# Patient Record
Sex: Male | Born: 1975 | Race: White | Hispanic: No | Marital: Married | Smoking: Former smoker
Health system: Southern US, Community
[De-identification: ages and names within clinical notes are randomized; demographics above are authoritative.]

## PROBLEM LIST (undated history)

## (undated) DIAGNOSIS — I209 Angina pectoris, unspecified: Secondary | ICD-10-CM

## (undated) DIAGNOSIS — F32A Depression, unspecified: Secondary | ICD-10-CM

## (undated) DIAGNOSIS — K219 Gastro-esophageal reflux disease without esophagitis: Secondary | ICD-10-CM

## (undated) DIAGNOSIS — M1990 Unspecified osteoarthritis, unspecified site: Secondary | ICD-10-CM

## (undated) DIAGNOSIS — J189 Pneumonia, unspecified organism: Secondary | ICD-10-CM

## (undated) DIAGNOSIS — R0600 Dyspnea, unspecified: Secondary | ICD-10-CM

## (undated) DIAGNOSIS — G4730 Sleep apnea, unspecified: Secondary | ICD-10-CM

## (undated) DIAGNOSIS — I10 Essential (primary) hypertension: Secondary | ICD-10-CM

## (undated) DIAGNOSIS — F419 Anxiety disorder, unspecified: Secondary | ICD-10-CM

## (undated) DIAGNOSIS — E119 Type 2 diabetes mellitus without complications: Secondary | ICD-10-CM

## (undated) DIAGNOSIS — R51 Headache: Secondary | ICD-10-CM

## (undated) DIAGNOSIS — F329 Major depressive disorder, single episode, unspecified: Secondary | ICD-10-CM

## (undated) HISTORY — PX: BACK SURGERY: SHX140

## (undated) HISTORY — PX: LUMBAR DISC SURGERY: SHX700

## (undated) HISTORY — PX: WISDOM TOOTH EXTRACTION: SHX21

## (undated) HISTORY — PX: OTHER SURGICAL HISTORY: SHX169

---

## 2004-10-04 ENCOUNTER — Encounter: Payer: Self-pay | Admitting: Neurosurgery

## 2004-10-18 ENCOUNTER — Encounter: Payer: Self-pay | Admitting: Neurosurgery

## 2004-11-18 ENCOUNTER — Encounter: Payer: Self-pay | Admitting: Neurosurgery

## 2005-01-04 ENCOUNTER — Ambulatory Visit (HOSPITAL_COMMUNITY): Admission: RE | Admit: 2005-01-04 | Discharge: 2005-01-05 | Payer: Self-pay | Admitting: Neurosurgery

## 2005-09-16 ENCOUNTER — Ambulatory Visit: Payer: Self-pay

## 2005-09-26 ENCOUNTER — Ambulatory Visit: Payer: Self-pay

## 2006-01-23 ENCOUNTER — Other Ambulatory Visit: Payer: Self-pay

## 2006-01-23 ENCOUNTER — Emergency Department: Payer: Self-pay | Admitting: Emergency Medicine

## 2006-07-03 ENCOUNTER — Encounter
Admission: RE | Admit: 2006-07-03 | Discharge: 2006-10-01 | Payer: Self-pay | Admitting: Physical Medicine & Rehabilitation

## 2006-07-04 ENCOUNTER — Ambulatory Visit: Payer: Self-pay | Admitting: Physical Medicine & Rehabilitation

## 2006-07-19 ENCOUNTER — Encounter: Payer: Self-pay | Admitting: Neurosurgery

## 2006-07-21 ENCOUNTER — Encounter: Payer: Self-pay | Admitting: Neurosurgery

## 2006-08-29 ENCOUNTER — Ambulatory Visit: Payer: Self-pay | Admitting: Physical Medicine & Rehabilitation

## 2006-11-09 ENCOUNTER — Ambulatory Visit: Payer: Self-pay | Admitting: Physical Medicine & Rehabilitation

## 2006-11-09 ENCOUNTER — Encounter
Admission: RE | Admit: 2006-11-09 | Discharge: 2007-02-07 | Payer: Self-pay | Admitting: Physical Medicine & Rehabilitation

## 2007-06-07 ENCOUNTER — Ambulatory Visit: Payer: Self-pay | Admitting: Specialist

## 2008-05-21 ENCOUNTER — Ambulatory Visit: Payer: Self-pay | Admitting: Specialist

## 2008-05-28 ENCOUNTER — Ambulatory Visit: Payer: Self-pay | Admitting: Specialist

## 2010-11-02 NOTE — Assessment & Plan Note (Signed)
PATIENT:  [name] [name]   DATE OF BIRTH:  01-13-1976.   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 He has evidence of epidural fibrosis on post  laminectomy MRI with gadolinium.  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 At that time he was having  exacerbation of pain but it appeared to be more facet mediated axial  pain, no real radicular symptoms; this has improved to a certain extent  and in fact has reduced his Skelaxin to once a day and his Celebrex to  once a day.  He can walk about.  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  Pain score is in 2-3 range.  His sleep is fair.  Relief from medications  is good.   VITAL SIGNS:  His blood pressure is 137/82, pulse 100, respirations 16,  O2 saturation 96% in room air.  GENERAL:  In no acute distress. Moderately obese male.   His gait is normal, he is able to toe-walk and heel-walk.  He has no  tenderness to palpation of his back.  He has end range pain with flexion  and extension of his lumbar spine.  He has normal deep tendon reflexes  and normal sensation, normal strength in lower extremities.   IMPRESSION:  Lumbar degenerative disc.  Had an episode of what appeared  to be facet arthropathy but this has subsided.  No increase in radicular  symptoms.   PLAN:  Will stop the Skelaxin, continue the Celebrex 200 mg once a day.  I will see him back in 2 months.  If he has worsening of pain off the  Skelaxin he can call in with me to call in Skelaxin or give a trial of  cyclobenzaprine given at his request is for a generic agent.  I asked  him to stop the Celebrex in a few weeks if all is going well, or use it  p.r.n.   I will see him back in 2 months.      Erick Colace, M.D.  Electronically Signed     AEK/MedQ  D:  12/05/2006 14:30:14  T:  12/05/2006 20:41:24  Job #:  [id number]   cc:   Hilda Lias, M.D.  Fax: [phone]

## 2010-11-02 NOTE — Assessment & Plan Note (Signed)
Date of last visit August 29, 2006.   HISTORY:  A 35 year old male with a history of L5-S1 disc herniation  causing right S1 radiculopathy, status post foraminotomy and  decompression laminectomy in 2006.  He has worked full-time at [employer] in the [employer].  Last visit he was taking 800 mg 1 or 2  tablets per day, as well as cyclobenzaprine 10 nightly.  His pain level  was down to 2 to 4 out of 10.  Over the last month or so, he was  completing physical therapy and has now completed it.  He has over the  last month or so developed increasing pain, mainly his low back greater  than in his leg.  He does not recall any type of specific event.  He was  moving a lot of items in the house, but he states that they were not too  heavy.  He has not continued with his home exercise program because he  has been very busy with his children's sports events.  His pain is worse  during the morning and interferes with general activity at a 4 out of 10  level.  He continues to work full-time.  He can walk 20 minutes  straight.  He can climb steps and drive.  His sleep is fair.  He has  tried ice for his back, but not heat.  Laying down does help his back,  particularly if he lays on his side.  His relief from meds is fair.  We  called in Celebrex and Skelaxin for him and he has been taking this over  the last 2 days and feels that they have been helping him.   REVIEW OF SYSTEMS:  Negative for bowel or bladder dysfunction.   SOCIAL HISTORY:  Married, lives with his wife and 3 sons.   FAMILY HISTORY:  Diabetes, high blood pressure.   EXAMINATION:  Blood pressure 149/84, pulse 88, respiratory rate 16, O2  sat 99% room air.  GENERAL:  No acute distress.  Mood and affect appropriate.  Orientation  x3.  Gait is normal.  His back has no real tenderness to palpation.  He does have pain with  extension greater than with right sided leaning.  His pain is almost  exclusively on the right side of his  back.  He has normal deep tendon  reflexes, normal sensation in the lower extremities as well as normal  strength.  His gait is normal, including heel walk and toe walk.  There  is no obvious scoliosis.   IMPRESSION:  Lumbar degenerative disc.  Appears to be developing more  facet mediated pain.  Does not appear to have any increase in the  radicular symptoms.   PLAN:  Given that he is getting some beneficial effect already from the  Celebrex and Skelaxin we will continue these.  I will see him back in 2  weeks and if at that time, he is no better, or in fact getting worse,  would schedule for lumbar medial branch blocks on the right side.  I did  describe risks and benefits of this procedure including the possibility  of radiofrequency procedures if need be.  I do not think any further  imaging studies are needed at this time.  I will see him back in 2  weeks.      Erick Colace, M.D.  Electronically Signed     AEK/MedQ  D:  11/09/2006 12:57:21  T:  11/09/2006 13:32:28  Job #:  [id number]

## 2010-11-05 NOTE — Op Note (Signed)
NAME:  [name] [name], [name] [name]              ACCOUNT NO.:  [id number]   MEDICAL RECORD NO.:  [id number]          PATIENT TYPE:  OIB   LOCATION:  3014                         FACILITY:  [facility]   PHYSICIAN:  Hilda Lias, M.D.   DATE OF BIRTH:  1975/11/29   DATE OF PROCEDURE:  01/04/2005  DATE OF DISCHARGE:                                 OPERATIVE REPORT   PREOPERATIVE DIAGNOSIS:  Chronic right L5-S1 herniated disk with S1  radiculopathy.   POSTOPERATIVE DIAGNOSIS:  Chronic right L5-S1 herniated disk with S1  radiculopathy.   PROCEDURE:  Right L5-S1 diskectomy, foraminotomy, decompression of the L5-S1  nerve root, microscope.   SURGEON:  Hilda Lias, M.D.   ASSISTANT:  Hewitt Shorts, M.D.   CLINICAL HISTORY:  The patient is being admitted because of back and right  leg pain which have been going on for more than a year.  He had mostly  sensory radiculopathy than pain.  X-rays show a herniated disk at the level  of L5-S1 into the foramen.  The patient wanted to proceed with surgery after  the risks were explained in the history and physical.   PROCEDURE:  The patient was taken to the OR, and he was positioned in a  prone manner.  X-ray was taken, which showed that indeed we were about an  inch below the L5-S1 interspace.  From then on the incision was opened and  the dissection was carried down until we were able to find the L5 spinous  process.  The muscles were retracted to the right side.  Because this  gentleman is over 260 pounds, we brought the microscope immediately and with  the drill, we drilled the lower lamina of L5.  The calcified yellow ligament  was also excised.  We found the S1 nerve root, which was swollen and  reddish.  Retraction was done.  Indeed, there was a herniated disk mostly  going to the foramen.  Incision was made and a large amount of degenerative  disk disease centrally and laterally was removed.  At the end we had good  decompression not only of the  S1 but also of the L5 nerve root.  The area  was irrigated, Valsalva maneuver was negative.  Depo-Medrol was left in the  epidural space as well as fentanyl.  Then the wound was closed with Vicryl  and a Steri-Strip.       EB/MEDQ  D:  01/04/2005  T:  01/04/2005  Job:  [id number]

## 2010-11-05 NOTE — H&P (Signed)
NAME:  [name], [name]              ACCOUNT NO.:  [id number]   MEDICAL RECORD NO.:  [id number]          PATIENT TYPE:  OIB   LOCATION:  3014                         FACILITY:  [facility]   PHYSICIAN:  Hilda Lias, M.D.   DATE OF BIRTH:  05-23-1976   DATE OF ADMISSION:  01/04/2005  DATE OF DISCHARGE:                                HISTORY & PHYSICAL   CHIEF COMPLAINT:  [name] [name] is a gentleman who had been complaining of a  tingling sensation and pain to the right leg for more than a year.  This  pain is getting worse.  He feels that not only the right leg but also the  right hand is getting quite sensitive.  He denies any shooting pain, he  denies any problem with blood clots.  He was seen by [place] initially in August  2005 and since then he has had conservative treatment with no improvement.  Because of the findings he wants to be admitted for surgery.   ALLERGIES:  PENICILLIN.   FAMILY HISTORY:  Positive for high blood pressure and diabetes.   SOCIAL HISTORY:  Negative.  He is 6 feet 1 inch and over 270 pounds.   REVIEW OF SYSTEMS:  Positive for back and right leg pain.   PHYSICAL EXAMINATION:  GENERAL:  The patient is a gentleman who has been to  my office on several occasions.  He always complains of pain going to the  right leg.  HEENT:  Head normocephalic.  Throat normal.  NECK:  Normal.  LUNGS:  Clear.  HEART:  Heart sounds normal.  ABDOMEN:  Normal.  EXTREMITIES:  Normal pulses.  NEUROLOGIC:  Mental status normal.  Cranial nerves normal.  Strength:  Shows  some weakness with plantar flexion of the right foot with decrease of the  right _________.  He had some tenderness in both hands.   DIAGNOSTIC STUDIES:  The MRI as well as the x-ray showed that he has a  herniated disk paracentral to the right side.   CLINICAL IMPRESSION:  Right L5-S1 herniated disk.  The last time I saw Mr.  [name] in my office was two weeks ago when he came in again with his wife  because of the  pain.  He knows about the risks and he is willing to pursue  with surgery.  The procedure will be a right L5-S1 diskectomy and the risks  were infection, CSF leak, worsening pain, paralysis, need for further  surgery which might require fusion.      EB/MEDQ  D:  01/04/2005  T:  01/04/2005  Job:  [id number]

## 2010-11-05 NOTE — Group Therapy Note (Signed)
Consult requested for the evaluation of right-sided S1 radiculopathy,  status post laminectomy, and specifically for pain management.   HISTORY:  Mr. [name] [name] is a pleasant 35 year old male with consult  kindly requested by Dr. Jeral Fruit for a back and right lower extremity  problem that originated in 2005.  He states he slept in a camper during  a child sleepover and woke up with back pain which progressed over time.  He initially presented to his primary MD, who eventually ordered an MRI,  demonstrating a disk bulge.  He does not recall the exact level.  He was  evaluated by Dr. Jeral Fruit  on February 02, 2004 and found to have normal  plain x-rays.  He also complained of upper extremity numbness and  tingling.  He was sent for an EMG, which is performed at [facility], and demonstrated mild carpal tunnel on the right greater  than the left.  His MRI was reviewed by Dr. Jeral Fruit, and conservative  care was initially tried.  Had some good results with physical therapy  at [facility].  TENS was particular helpful for  him and he did well for a couple of months, but then presented again to  Dr. Jeral Fruit in March of 2006 with increasing pain down the right leg.  MRI demonstrating the L5-S1 disk impinging upon the L5 nerve root.  This  is extrapyramidal.  He had a decompression laminectomy per Dr. Jeral Fruit, a  foraminotomy, L5-S1 discectomy on the right.  He no postoperative  complications.  He has had further followup, including a repeat MRI on  May 16, 2006 demonstrating no recurrent disk, however, he has an  epidural fibrosis around the S1 nerve root on the right.  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   REVIEW OF SYSTEMS:  Positive for numbness and tingling in the right  lower extremity.  He otherwise is quite healthy.  His only  medications  are ibuprofen; he takes 200 mg 4 times a day.  He has poor sleep and he  states that his lower extremity pain interferes with sleep.   ALLERGIES:  SHELLFISH and PENICILLIN which appears to be anaphylactic.   SOCIAL HISTORY:  He is married and lives with his wife and three  children.   FAMILY HISTORY:  Positive for diabetes and high blood pressure.   EXAMINATION:  A well-built male in no acute distress.  Mood and affect  appropriate.  His blood pressure is 152/94, pulse 80, respirations 16,  O2 saturation is 100%.  He is orientated times 3.  His gait is normal.  He has good spine forward flexion.  Extension is limited to 50%,  accompanied by some pain in the back.  Deep tendon reflexes are normal.  Range of motion is normal, bilateral upper and lower extremities.  Sensation is normal, bilateral upper and lower extremities, to pinprick,  proprioception, and light touch.  Neck range of motion is good.  He can  both toe walk and heel walk.   IMPRESSION:  1. Lumbar post laminectomy syndrome.  2. 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   RECOMMENDATIONS:  1. Given that he is responding quite well to ibuprofen at a non-      prescription dose, I think he can benefit from trying this twice a  day, once in the morning and once at night.  In addition, we will      start him on cyclobenzaprine 7.5 mg nightly.  Gave him some      samples.  2. Given his favorable response to TENS, as well as physical therapy      in the past, I think it would be helpful to repeat some therapy at      [facility] and include a TENS evaluation and      to obtain a home unit if it proves to be beneficial once again.  I      will see him back in one month.  If he is not doing much better, we      will consider other treatment modalities, including epidural      steroid injection plus/minus facet injections.   Other treatment options such acupuncture have been  discussed as well.   Thank you very much for this interesting consultation.      Erick Colace, M.D.  Electronically Signed     AEK/MedQ  D:  07/04/2006 17:01:59  T:  07/05/2006 05:56:18  Job #:  [id number]   cc:   Hilda Lias, M.D.  Fax: [phone]

## 2011-08-03 ENCOUNTER — Other Ambulatory Visit: Payer: Self-pay | Admitting: Neurosurgery

## 2011-08-03 DIAGNOSIS — M5430 Sciatica, unspecified side: Secondary | ICD-10-CM

## 2011-08-10 ENCOUNTER — Ambulatory Visit
Admission: RE | Admit: 2011-08-10 | Discharge: 2011-08-10 | Disposition: A | Discharge status: Home / Self Care | Payer: Worker's Compensation | Source: Ambulatory Visit | Attending: Neurosurgery | Admitting: Neurosurgery

## 2011-08-10 DIAGNOSIS — M5430 Sciatica, unspecified side: Secondary | ICD-10-CM

## 2011-08-10 MED ORDER — DIAZEPAM 5 MG PO TABS
10.0000 mg | ORAL_TABLET | Freq: Once | ORAL | Status: AC
  Administered 2011-08-10: 10 mg via ORAL

## 2011-08-10 NOTE — Progress Notes (Signed)
Pt has been off lexapro for 2 days.

## 2011-08-10 NOTE — Progress Notes (Signed)
Resting quietly in nursing station with complaint of mild headache.  Does not care for any pain medications at present time.  Wife at bedside.  jkl

## 2011-08-10 NOTE — Discharge Instructions (Signed)
Myelogram  Discharge Instructions  1. Go home and rest quietly for the next 24 hours.  It is important to lie flat for the next 24 hours.  Get up only to go to the restroom.  You may lie in the bed or on a couch on your back, your stomach, your left side or your right side.  You may have one pillow under your head.  You may have pillows between your knees while you are on your side or under your knees while you are on your back.  2. DO NOT drive today.  Recline the seat as far back as it will go, while still wearing your seat belt, on the way home.  3. You may get up to go to the bathroom as needed.  You may sit up for 10 minutes to eat.  You may resume your normal diet and medications unless otherwise indicated.  Drink lots of extra fluids today and tomorrow.  4. The incidence of headache, nausea, or vomiting is about 5% (one in 20 patients).  If you develop a headache, lie flat and drink plenty of fluids until the headache goes away.  Caffeinated beverages may be helpful.  If you develop severe nausea and vomiting or a headache that does not go away with flat bed rest, call [phone].  5. You may resume normal activities after your 24 hours of bed rest is over; however, do not exert yourself strongly or do any heavy lifting tomorrow.  6. Call your physician for a follow-up appointment.  The results of your myelogram will be sent directly to your physician by the following day.  7. If you have any questions or if complications develop after you arrive home, please call [phone].  Discharge instructions have been explained to the patient.  The patient, or the person responsible for the patient, fully understands these instructions.   May resume lexapro on August 11, 2011, after 9:30 am.

## 2011-11-08 HISTORY — PX: LUMBAR SPINE SURGERY: SHX701

## 2011-11-23 ENCOUNTER — Inpatient Hospital Stay (HOSPITAL_COMMUNITY): Payer: Worker's Compensation | Admitting: Anesthesiology

## 2011-11-23 ENCOUNTER — Encounter (HOSPITAL_COMMUNITY)
Admission: AD | Disposition: A | Discharge status: Home / Self Care | Payer: Self-pay | Source: Ambulatory Visit | Attending: Neurosurgery

## 2011-11-23 ENCOUNTER — Inpatient Hospital Stay (HOSPITAL_COMMUNITY)
Admission: AD | Admit: 2011-11-23 | Discharge: 2011-11-26 | DRG: 863 | Disposition: A | Discharge status: Home / Self Care | Payer: Worker's Compensation | Source: Ambulatory Visit | Attending: Neurosurgery | Admitting: Neurosurgery

## 2011-11-23 ENCOUNTER — Encounter (HOSPITAL_COMMUNITY): Payer: Self-pay | Admitting: Anesthesiology

## 2011-11-23 ENCOUNTER — Other Ambulatory Visit: Payer: Self-pay | Admitting: Neurosurgery

## 2011-11-23 ENCOUNTER — Encounter (HOSPITAL_COMMUNITY): Payer: Self-pay | Admitting: General Practice

## 2011-11-23 DIAGNOSIS — F329 Major depressive disorder, single episode, unspecified: Secondary | ICD-10-CM | POA: Diagnosis present

## 2011-11-23 DIAGNOSIS — G4730 Sleep apnea, unspecified: Secondary | ICD-10-CM | POA: Diagnosis present

## 2011-11-23 DIAGNOSIS — Z87891 Personal history of nicotine dependence: Secondary | ICD-10-CM

## 2011-11-23 DIAGNOSIS — F3289 Other specified depressive episodes: Secondary | ICD-10-CM | POA: Diagnosis present

## 2011-11-23 DIAGNOSIS — T8140XA Infection following a procedure, unspecified, initial encounter: Principal | ICD-10-CM | POA: Diagnosis present

## 2011-11-23 DIAGNOSIS — B951 Streptococcus, group B, as the cause of diseases classified elsewhere: Secondary | ICD-10-CM | POA: Diagnosis present

## 2011-11-23 DIAGNOSIS — K219 Gastro-esophageal reflux disease without esophagitis: Secondary | ICD-10-CM | POA: Diagnosis present

## 2011-11-23 DIAGNOSIS — I10 Essential (primary) hypertension: Secondary | ICD-10-CM | POA: Diagnosis present

## 2011-11-23 DIAGNOSIS — T8149XA Infection following a procedure, other surgical site, initial encounter: Secondary | ICD-10-CM

## 2011-11-23 DIAGNOSIS — Z880 Allergy status to penicillin: Secondary | ICD-10-CM

## 2011-11-23 DIAGNOSIS — Y834 Other reconstructive surgery as the cause of abnormal reaction of the patient, or of later complication, without mention of misadventure at the time of the procedure: Secondary | ICD-10-CM | POA: Diagnosis present

## 2011-11-23 DIAGNOSIS — Y92009 Unspecified place in unspecified non-institutional (private) residence as the place of occurrence of the external cause: Secondary | ICD-10-CM

## 2011-11-23 HISTORY — DX: Essential (primary) hypertension: I10

## 2011-11-23 HISTORY — DX: Sleep apnea, unspecified: G47.30

## 2011-11-23 HISTORY — DX: Gastro-esophageal reflux disease without esophagitis: K21.9

## 2011-11-23 HISTORY — DX: Major depressive disorder, single episode, unspecified: F32.9

## 2011-11-23 HISTORY — DX: Headache: R51

## 2011-11-23 HISTORY — PX: LUMBAR WOUND DEBRIDEMENT: SHX1988

## 2011-11-23 HISTORY — DX: Depression, unspecified: F32.A

## 2011-11-23 LAB — BASIC METABOLIC PANEL
CO2: 28 mEq/L (ref 19–32)
Calcium: 9.3 mg/dL (ref 8.4–10.5)
Creatinine, Ser: 0.99 mg/dL (ref 0.50–1.35)
GFR calc non Af Amer: >90 mL/min (ref >90)
Sodium: 133 mEq/L — ABNORMAL LOW (ref 135–145)

## 2011-11-23 LAB — CBC
HCT: 41.7 % (ref 39.0–52.0)
MCH: 31.8 pg (ref 26.0–34.0)
RBC: 4.62 MIL/uL (ref 4.22–5.81)
WBC: 26.6 10*3/uL — ABNORMAL HIGH (ref 4.0–10.5)

## 2011-11-23 LAB — PROTIME-INR: Prothrombin Time: 14.1 seconds (ref 11.6–15.2)

## 2011-11-23 SURGERY — LUMBAR WOUND DEBRIDEMENT
Anesthesia: General | Site: Back | Wound class: Dirty or Infected

## 2011-11-23 MED ORDER — HYDROMORPHONE HCL PF 1 MG/ML IJ SOLN
0.2500 mg | INTRAMUSCULAR | Status: DC | PRN
  Administered 2011-11-23: 0.5 mg via INTRAVENOUS

## 2011-11-23 MED ORDER — ESCITALOPRAM OXALATE 20 MG PO TABS
20.0000 mg | ORAL_TABLET | Freq: Every day | ORAL | Status: DC
  Administered 2011-11-23: 20 mg via ORAL
  Filled 2011-11-23 (×2): qty 1

## 2011-11-23 MED ORDER — PHENOL 1.4 % MT LIQD: 1.0000 | OROMUCOSAL | Status: DC | PRN

## 2011-11-23 MED ORDER — NEOSTIGMINE METHYLSULFATE 1 MG/ML IJ SOLN
INTRAMUSCULAR | Status: DC | PRN
  Administered 2011-11-23: 4 mg via INTRAVENOUS

## 2011-11-23 MED ORDER — DIAZEPAM 5 MG PO TABS
5.0000 mg | ORAL_TABLET | Freq: Four times a day (QID) | ORAL | Status: DC | PRN
  Administered 2011-11-24 – 2011-11-25 (×3): 5 mg via ORAL
  Filled 2011-11-23 (×4): qty 1

## 2011-11-23 MED ORDER — LIDOCAINE HCL (CARDIAC) 20 MG/ML IV SOLN
INTRAVENOUS | Status: DC | PRN
  Administered 2011-11-23: 40 mg via INTRAVENOUS

## 2011-11-23 MED ORDER — LISINOPRIL 40 MG PO TABS
40.0000 mg | ORAL_TABLET | Freq: Every day | ORAL | Status: DC
  Administered 2011-11-25: 40 mg via ORAL
  Filled 2011-11-23 (×3): qty 1

## 2011-11-23 MED ORDER — SODIUM CHLORIDE 0.9 % IV SOLN: INTRAVENOUS | Status: DC

## 2011-11-23 MED ORDER — MENTHOL 3 MG MT LOZG: 1.0000 | LOZENGE | OROMUCOSAL | Status: DC | PRN

## 2011-11-23 MED ORDER — MEPERIDINE HCL 25 MG/ML IJ SOLN: 6.2500 mg | INTRAMUSCULAR | Status: DC | PRN

## 2011-11-23 MED ORDER — BIMATOPROST 0.01 % OP SOLN
1.0000 [drp] | Freq: Every day | OPHTHALMIC | Status: DC
  Administered 2011-11-23 – 2011-11-25 (×3): 1 [drp] via OPHTHALMIC
  Filled 2011-11-23: qty 2.5

## 2011-11-23 MED ORDER — HYDROMORPHONE HCL PF 1 MG/ML IJ SOLN
INTRAMUSCULAR | Status: AC
  Administered 2011-11-23: 0.5 mg via INTRAVENOUS
  Filled 2011-11-23: qty 1

## 2011-11-23 MED ORDER — FENTANYL CITRATE 0.05 MG/ML IJ SOLN
INTRAMUSCULAR | Status: DC | PRN
  Administered 2011-11-23: 150 ug via INTRAVENOUS
  Administered 2011-11-23: 100 ug via INTRAVENOUS

## 2011-11-23 MED ORDER — ONDANSETRON HCL 4 MG/2ML IJ SOLN
4.0000 mg | INTRAMUSCULAR | Status: DC | PRN
  Administered 2011-11-24: 4 mg via INTRAVENOUS
  Filled 2011-11-23: qty 2

## 2011-11-23 MED ORDER — GLYCOPYRROLATE 0.2 MG/ML IJ SOLN
INTRAMUSCULAR | Status: DC | PRN
  Administered 2011-11-23: .5 mg via INTRAVENOUS

## 2011-11-23 MED ORDER — LACTATED RINGERS IV SOLN
INTRAVENOUS | Status: DC | PRN
  Administered 2011-11-23 (×2): via INTRAVENOUS

## 2011-11-23 MED ORDER — SODIUM CHLORIDE 0.9 % IJ SOLN: 3.0000 mL | INTRAMUSCULAR | Status: DC | PRN

## 2011-11-23 MED ORDER — SODIUM CHLORIDE 0.9 % IV SOLN
INTRAVENOUS | Status: DC
  Administered 2011-11-23: 14:00:00 via INTRAVENOUS

## 2011-11-23 MED ORDER — MIDAZOLAM HCL 5 MG/5ML IJ SOLN
INTRAMUSCULAR | Status: DC | PRN
  Administered 2011-11-23: 2 mg via INTRAVENOUS

## 2011-11-23 MED ORDER — LISINOPRIL 40 MG PO TABS: 40.0000 mg | ORAL_TABLET | Freq: Every day | ORAL | Status: DC

## 2011-11-23 MED ORDER — OXYCODONE-ACETAMINOPHEN 5-325 MG PO TABS
1.0000 | ORAL_TABLET | ORAL | Status: DC | PRN
  Administered 2011-11-23 – 2011-11-26 (×9): 2 via ORAL
  Filled 2011-11-23 (×9): qty 2

## 2011-11-23 MED ORDER — SODIUM CHLORIDE 0.9 % IV SOLN: 250.0000 mL | INTRAVENOUS | Status: DC

## 2011-11-23 MED ORDER — MORPHINE SULFATE 4 MG/ML IJ SOLN
4.0000 mg | INTRAMUSCULAR | Status: DC | PRN
  Administered 2011-11-23: 4 mg via INTRAVENOUS
  Filled 2011-11-23: qty 1

## 2011-11-23 MED ORDER — PROPOFOL 10 MG/ML IV EMUL
INTRAVENOUS | Status: DC | PRN
  Administered 2011-11-23: 200 mg via INTRAVENOUS

## 2011-11-23 MED ORDER — ROCURONIUM BROMIDE 100 MG/10ML IV SOLN
INTRAVENOUS | Status: DC | PRN
  Administered 2011-11-23: 30 mg via INTRAVENOUS

## 2011-11-23 MED ORDER — PROMETHAZINE HCL 25 MG/ML IJ SOLN: 6.2500 mg | INTRAMUSCULAR | Status: DC | PRN

## 2011-11-23 MED ORDER — SUCCINYLCHOLINE CHLORIDE 20 MG/ML IJ SOLN
INTRAMUSCULAR | Status: DC | PRN
  Administered 2011-11-23: 140 mg via INTRAVENOUS

## 2011-11-23 MED ORDER — MIDAZOLAM HCL 2 MG/2ML IJ SOLN: 0.5000 mg | Freq: Once | INTRAMUSCULAR | Status: DC | PRN

## 2011-11-23 MED ORDER — VANCOMYCIN HCL 1000 MG IV SOLR
1250.0000 mg | Freq: Two times a day (BID) | INTRAVENOUS | Status: DC
  Administered 2011-11-24 – 2011-11-25 (×4): 1250 mg via INTRAVENOUS
  Filled 2011-11-23 (×5): qty 1250

## 2011-11-23 MED ORDER — SODIUM CHLORIDE 0.9 % IJ SOLN
3.0000 mL | Freq: Two times a day (BID) | INTRAMUSCULAR | Status: DC
  Administered 2011-11-23 – 2011-11-25 (×4): 3 mL via INTRAVENOUS

## 2011-11-23 MED ORDER — ACETAMINOPHEN 325 MG PO TABS: 650.0000 mg | ORAL_TABLET | ORAL | Status: DC | PRN

## 2011-11-23 MED ORDER — VANCOMYCIN HCL IN DEXTROSE 1-5 GM/200ML-% IV SOLN
INTRAVENOUS | Status: AC
  Administered 2011-11-23: 1000 mg via INTRAVENOUS
  Filled 2011-11-23: qty 200

## 2011-11-23 MED ORDER — MORPHINE SULFATE 4 MG/ML IJ SOLN: 4.0000 mg | INTRAMUSCULAR | Status: DC | PRN

## 2011-11-23 MED ORDER — ONDANSETRON HCL 4 MG/2ML IJ SOLN
INTRAMUSCULAR | Status: DC | PRN
  Administered 2011-11-23: 4 mg via INTRAVENOUS

## 2011-11-23 MED ORDER — ACETAMINOPHEN 650 MG RE SUPP: 650.0000 mg | RECTAL | Status: DC | PRN

## 2011-11-23 MED ORDER — 0.9 % SODIUM CHLORIDE (POUR BTL) OPTIME
TOPICAL | Status: DC | PRN
  Administered 2011-11-23 (×2): 1000 mL

## 2011-11-23 SURGICAL SUPPLY — 59 items
BENZOIN TINCTURE PRP APPL 2/3 (GAUZE/BANDAGES/DRESSINGS) IMPLANT
BLADE SURG ROTATE 9660 (MISCELLANEOUS) IMPLANT
CANISTER SUCTION 2500CC (MISCELLANEOUS) ×2 IMPLANT
CLOTH BEACON ORANGE TIMEOUT ST (SAFETY) ×2 IMPLANT
DRAPE LAPAROTOMY 100X72X124 (DRAPES) ×2 IMPLANT
DRAPE MICROSCOPE LEICA (MISCELLANEOUS) ×2 IMPLANT
DRAPE POUCH INSTRU U-SHP 10X18 (DRAPES) ×2 IMPLANT
DRSG PAD ABDOMINAL 8X10 ST (GAUZE/BANDAGES/DRESSINGS) ×4 IMPLANT
DURAPREP 26ML APPLICATOR (WOUND CARE) ×2 IMPLANT
ELECT REM PT RETURN 9FT ADLT (ELECTROSURGICAL) ×2
ELECTRODE REM PT RTRN 9FT ADLT (ELECTROSURGICAL) ×1 IMPLANT
GAUZE SPONGE 4X4 16PLY XRAY LF (GAUZE/BANDAGES/DRESSINGS) IMPLANT
GLOVE BIO SURGEON STRL SZ 6.5 (GLOVE) IMPLANT
GLOVE BIO SURGEON STRL SZ7 (GLOVE) IMPLANT
GLOVE BIO SURGEON STRL SZ7.5 (GLOVE) IMPLANT
GLOVE BIO SURGEON STRL SZ8 (GLOVE) IMPLANT
GLOVE BIO SURGEON STRL SZ8.5 (GLOVE) IMPLANT
GLOVE BIOGEL M 8.0 STRL (GLOVE) ×2 IMPLANT
GLOVE ECLIPSE 6.5 STRL STRAW (GLOVE) IMPLANT
GLOVE ECLIPSE 7.0 STRL STRAW (GLOVE) ×4 IMPLANT
GLOVE ECLIPSE 7.5 STRL STRAW (GLOVE) IMPLANT
GLOVE ECLIPSE 8.0 STRL XLNG CF (GLOVE) IMPLANT
GLOVE ECLIPSE 8.5 STRL (GLOVE) IMPLANT
GLOVE EXAM NITRILE LRG STRL (GLOVE) IMPLANT
GLOVE EXAM NITRILE MD LF STRL (GLOVE) IMPLANT
GLOVE EXAM NITRILE XL STR (GLOVE) IMPLANT
GLOVE EXAM NITRILE XS STR PU (GLOVE) IMPLANT
GLOVE INDICATOR 6.5 STRL GRN (GLOVE) IMPLANT
GLOVE INDICATOR 7.0 STRL GRN (GLOVE) IMPLANT
GLOVE INDICATOR 7.5 STRL GRN (GLOVE) ×2 IMPLANT
GLOVE INDICATOR 8.0 STRL GRN (GLOVE) IMPLANT
GLOVE INDICATOR 8.5 STRL (GLOVE) IMPLANT
GLOVE OPTIFIT SS 8.0 STRL (GLOVE) IMPLANT
GLOVE SURG SS PI 6.5 STRL IVOR (GLOVE) IMPLANT
GOWN BRE IMP SLV AUR LG STRL (GOWN DISPOSABLE) ×2 IMPLANT
GOWN BRE IMP SLV AUR XL STRL (GOWN DISPOSABLE) ×2 IMPLANT
GOWN STRL REIN 2XL LVL4 (GOWN DISPOSABLE) IMPLANT
KIT BASIN OR (CUSTOM PROCEDURE TRAY) ×2 IMPLANT
KIT ROOM TURNOVER OR (KITS) ×2 IMPLANT
NS IRRIG 1000ML POUR BTL (IV SOLUTION) ×4 IMPLANT
PACK LAMINECTOMY NEURO (CUSTOM PROCEDURE TRAY) ×2 IMPLANT
PAD ARMBOARD 7.5X6 YLW CONV (MISCELLANEOUS) ×6 IMPLANT
RUBBERBAND STERILE (MISCELLANEOUS) ×2 IMPLANT
SPONGE GAUZE 4X4 12PLY (GAUZE/BANDAGES/DRESSINGS) ×2 IMPLANT
SPONGE SURGIFOAM ABS GEL SZ50 (HEMOSTASIS) ×2 IMPLANT
STAPLER SKIN PROX WIDE 3.9 (STAPLE) ×2 IMPLANT
STRIP CLOSURE SKIN 1/2X4 (GAUZE/BANDAGES/DRESSINGS) IMPLANT
SUT VIC AB 0 CT1 18XCR BRD8 (SUTURE) ×1 IMPLANT
SUT VIC AB 0 CT1 8-18 (SUTURE) ×1
SUT VIC AB 2-0 CP2 18 (SUTURE) ×2 IMPLANT
SUT VIC AB 3-0 SH 8-18 (SUTURE) ×2 IMPLANT
SWAB CULTURE LIQ STUART DBL (MISCELLANEOUS) ×4 IMPLANT
SYR 20ML ECCENTRIC (SYRINGE) ×2 IMPLANT
SYR BULB IRRIGATION 50ML (SYRINGE) ×2 IMPLANT
TAPE CLOTH SURG 4X10 WHT LF (GAUZE/BANDAGES/DRESSINGS) ×2 IMPLANT
TOWEL OR 17X24 6PK STRL BLUE (TOWEL DISPOSABLE) ×2 IMPLANT
TOWEL OR 17X26 10 PK STRL BLUE (TOWEL DISPOSABLE) ×2 IMPLANT
TUBE ANAEROBIC SPECIMEN COL (MISCELLANEOUS) ×4 IMPLANT
WATER STERILE IRR 1000ML POUR (IV SOLUTION) ×2 IMPLANT

## 2011-11-23 NOTE — Progress Notes (Signed)
I&d of lumbar wound done. Specimen sent to the lab

## 2011-11-23 NOTE — Anesthesia Preprocedure Evaluation (Addendum)
Anesthesia Evaluation  Patient identified by MRN, date of birth, ID band Patient awake    Reviewed: Allergy & Precautions, H&P , NPO status , Patient's Chart, lab work & pertinent test results  History of Anesthesia Complications Negative for: history of anesthetic complications  Airway Mallampati: III TM Distance: >3 FB Neck ROM: Full    Dental No notable dental hx. (+) Teeth Intact and Dental Advisory Given   Pulmonary sleep apnea and Continuous Positive Airway Pressure Ventilation ,  breath sounds clear to auscultation  Pulmonary exam normal       Cardiovascular hypertension, Pt. on medications Rhythm:Regular Rate:Normal     Neuro/Psych  Headaches, PSYCHIATRIC DISORDERS Depression Chronic back pain: narcotics    GI/Hepatic Neg liver ROS, GERD-  Poorly Controlled,  Endo/Other  Morbid obesity  Renal/GU negative Renal ROS     Musculoskeletal negative musculoskeletal ROS (+)   Abdominal (+) + obese,   Peds  Hematology negative hematology ROS (+)   Anesthesia Other Findings   Reproductive/Obstetrics                          Anesthesia Physical Anesthesia Plan  ASA: III and Emergent  Anesthesia Plan: General   Post-op Pain Management:    Induction: Intravenous  Airway Management Planned: Oral ETT  Additional Equipment:   Intra-op Plan:   Post-operative Plan: Extubation in OR  Informed Consent: I have reviewed the patients History and Physical, chart, labs and discussed the procedure including the risks, benefits and alternatives for the proposed anesthesia with the patient or authorized representative who has indicated his/her understanding and acceptance.   Dental advisory given  Plan Discussed with: CRNA and Surgeon  Anesthesia Plan Comments: (Plan routine monitors, GETA)        Anesthesia Quick Evaluation

## 2011-11-23 NOTE — Op Note (Signed)
NAMESHERVIN, CYPERT              ACCOUNT NO.:  [id number]  MEDICAL RECORD NO.:  [id number]  LOCATION:  3021                         FACILITY:  [facility]  PHYSICIAN:  Hilda Lias, M.D.   DATE OF BIRTH:  04/01/1976  DATE OF PROCEDURE:  11/23/2011 DATE OF DISCHARGE:                              OPERATIVE REPORT   PREOPERATIVE DIAGNOSIS:  Lumbar wound infection.  POSTOPERATIVE DIAGNOSIS:  Lumbar wound infection.  PROCEDURE:  I and D of lumbar wound.  SURGEON:  Hilda Lias, MD  CLINICAL HISTORY:  Mr. Simonis is a gentleman who underwent laminotomy and foraminotomy on Nov 08, 2011.  The patient was discharged 4-hour later with no pain.  The patient did well.  Last week, he fell and for the past 3 days, he had been complaining of increase of temperature with some wound drainage.  I saw today in my office and we got the finding, he was admitted to the hospital.  The white cell was 27,000.  PROCEDURE:  The patient was taken to the OR, and after intubation, he was positioned in a prone manner.  Immediately when he was in prone position, he started draining some fluid.  A specimen was taken and sent to the lab.  Then, the area was cleaned with DuraPrep, and incision was opened.  Immediately, we found purulent material and a frequent specimen was taken.  Then, we went straight down to the area where he had the surgery.  Irrigation with 3 liters of saline solution was done.  With the help of the microscope, we were able to see the postoperative site. Valsalva twice up to 40 was negative.  From then on, the area was irrigated.  The previous stitch were removed.  Then, the wound was closed loosely with several stitch of 2-0 and 3-0 Vicryl and the skin with staples.  He is going to start on vancomycin and we will get Infectious Disease to help in his care.          ______________________________ Hilda Lias, M.D.     EB/MEDQ  D:  11/23/2011  T:  11/23/2011  Job:  [id number]

## 2011-11-23 NOTE — Anesthesia Procedure Notes (Signed)
Procedure Name: Intubation Date/Time: 11/23/2011 5:22 PM Performed by: Nicholos Johns Pre-anesthesia Checklist: Patient identified, Timeout performed, Emergency Drugs available, Suction available and Patient being monitored Patient Re-evaluated:Patient Re-evaluated prior to inductionOxygen Delivery Method: Circle system utilized Preoxygenation: Pre-oxygenation with 100% oxygen Intubation Type: IV induction, Cricoid Pressure applied and Rapid sequence Laryngoscope Size: Mac and 4 Grade View: Grade I Tube type: Oral Tube size: 7.5 mm Number of attempts: 1 Airway Equipment and Method: Stylet Placement Confirmation: positive ETCO2,  ETT inserted through vocal cords under direct vision and breath sounds checked- equal and bilateral Secured at: 24 cm Tube secured with: Tape Dental Injury: Teeth and Oropharynx as per pre-operative assessment

## 2011-11-23 NOTE — Progress Notes (Signed)
Pt. Had temp of 100.3 with chills. MD made aware. Wants to give sips of water for now.  Will continue to monitor.

## 2011-11-23 NOTE — Anesthesia Postprocedure Evaluation (Signed)
Anesthesia Post Note  Patient: [name] [name]  Procedure(s) Performed: Procedure(s) (LRB): LUMBAR WOUND DEBRIDEMENT (N/A)  Anesthesia type: general  Patient location: PACU  Post pain: Pain level controlled  Post assessment: Patient's Cardiovascular Status Stable  Last Vitals:  Filed Vitals:   11/23/11 1915  BP: 118/76  Pulse: 86  Temp:   Resp: 10    Post vital signs: Reviewed and stable  Level of consciousness: sedated  Complications: No apparent anesthesia complications

## 2011-11-23 NOTE — H&P (Signed)
[name] [name] is an 36 y.o. male.   Chief Complaint: fever. HPI: patient had a right l5s1 laminotomy on 11/08/11 as an outpatient because of chronic pain. Patient did well but a week ago he fell in his driveway. Two days ago he had some drainage in the lumbar area and headache since yesterday.  Past Medical History  Diagnosis Date  . Hypertension   . GERD (gastroesophageal reflux disease)   . Depression   . Sleep apnea     cpap  . Headache     Past Surgical History  Procedure Date  . Lumbar spine surgery 11/08/2011  . Lumbar disc surgery     No family history on file. Social History:  reports that he quit smoking about 19 years ago. His smoking use included Cigarettes. He has a .9 pack-year smoking history. He has never used smokeless tobacco. He reports that he drinks alcohol. He reports that he does not use illicit drugs.  Allergies:  Allergies  Allergen Reactions  . Penicillins Anaphylaxis    When he was 4    Medications Prior to Admission  Medication Sig Dispense Refill  . acetaminophen (TYLENOL) 500 MG tablet Take 1,000 mg by mouth every 6 (six) hours as needed. For pain      . bimatoprost (LUMIGAN) 0.01 % SOLN Place 1 drop into both eyes at bedtime.      . diazepam (VALIUM) 10 MG tablet Take 10 mg by mouth every 6 (six) hours as needed. For muscle spasms      . escitalopram (LEXAPRO) 20 MG tablet Take 20 mg by mouth daily.      Marland Kitchen lisinopril (PRINIVIL,ZESTRIL) 40 MG tablet Take 40 mg by mouth daily.      Marland Kitchen oxyCODONE (ROXICODONE) 15 MG immediate release tablet Take 15 mg by mouth every 4 (four) hours as needed. For pain        Results for orders placed during the hospital encounter of 11/23/11 (from the past 48 hour(s))  CBC     Status: Abnormal   Collection Time   11/23/11  1:49 PM      Component Value Range Comment   WBC 26.6 (*) 4.0 - 10.5 (K/uL)    RBC 4.62  4.22 - 5.81 (MIL/uL)    Hemoglobin 14.7  13.0 - 17.0 (g/dL)    HCT 62.9  52.8 - 41.3 (%)    MCV 90.3  78.0  - 100.0 (fL)    MCH 31.8  26.0 - 34.0 (pg)    MCHC 35.3  30.0 - 36.0 (g/dL)    RDW 24.4  01.0 - 27.2 (%)    Platelets 201  150 - 400 (K/uL)   APTT     Status: Normal   Collection Time   11/23/11  1:49 PM      Component Value Range Comment   aPTT 30  24 - 37 (seconds)   PROTIME-INR     Status: Normal   Collection Time   11/23/11  1:49 PM      Component Value Range Comment   Prothrombin Time 14.1  11.6 - 15.2 (seconds)    INR 1.07  0.00 - 1.49     No results found.  Review of Systems  Constitutional: Positive for fever, chills and malaise/fatigue.  Eyes: Negative.   Respiratory: Negative.   Cardiovascular: Negative.   Gastrointestinal: Negative.   Genitourinary: Negative.   Musculoskeletal: Positive for back pain.  Skin: Positive for rash.  Neurological: Positive for headaches.  Endo/Heme/Allergies: Negative.  Psychiatric/Behavioral: Negative.     Blood pressure 101/66, pulse 101, temperature 100.3 F (37.9 C), temperature source Oral, resp. rate 20, SpO2 96.00%. Physical Exam hent, nl.  Neck, nl. Cv, nl. Lungs, clear. Abdomen, nl. Extremities, nl. neuor no evidence of weakness. Lumbar wound is erythematous, tender to touch. Can not get any drainage.  Assessment/Planpatient is admited for i&d of lumbar wound. Spoke with him and wife in my office about risks and benefits  Alayzha An M 11/23/2011, 2:38 PM

## 2011-11-23 NOTE — Progress Notes (Signed)
Pt admitted from office in stable condition. VSS and pt in moderate pain. Incisional site dry but swollen, hard, red. MD paged about no orders or H&P on patient. Will continue to monitor.

## 2011-11-23 NOTE — Progress Notes (Signed)
Op note [id number]

## 2011-11-23 NOTE — Progress Notes (Signed)
ANTIBIOTIC CONSULT NOTE - INITIAL  Pharmacy Consult for Vancomycin Indication: Lumbar wound infection  Allergies  Allergen Reactions  . Penicillins Anaphylaxis    When he was 4    Patient Measurements:   Wt: 134 kg Ht: 73 inches  Vital Signs: Temp: 100.8 F (38.2 C) (06/05 1815) Temp src: Oral (06/05 1555) BP: 119/69 mmHg (06/05 1945) Pulse Rate: 84  (06/05 1945) Intake/Output from previous day:   Intake/Output from this shift:    Labs:  Basename 11/23/11 1349  WBC 26.6*  HGB 14.7  PLT 201  LABCREA --  CREATININE --   CrCl is unknown because no creatinine reading has been taken. No results found for this basename: VANCOTROUGH:2,VANCOPEAK:2,VANCORANDOM:2,GENTTROUGH:2,GENTPEAK:2,GENTRANDOM:2,TOBRATROUGH:2,TOBRAPEAK:2,TOBRARND:2,AMIKACINPEAK:2,AMIKACINTROU:2,AMIKACIN:2, in the last 72 hours   Microbiology: No results found for this or any previous visit (from the past 720 hour(s)).  Medical History: Past Medical History  Diagnosis Date  . Hypertension   . GERD (gastroesophageal reflux disease)   . Depression   . Sleep apnea     cpap  . Headache     Assessment: 36 y.o. M with a recent laminotomy on 11/08/11 who was admitted today with drainage in the lumbar area and headache. He underwent a I&D of the wound and pharmacy has now been consulted to start Vancomycin for a lumbar wound infection while awaiting culture results. The patient received one dose of Vancomycin during the procedure at 1742. Wt: 134 kg, SCr: 0.99 , CrCl~100 ml/min  Goal of Therapy:  Vancomycin trough level 10-15 mcg/ml  Plan:  1. Vancomycin 1250 mg IV every 12 hours 2. Will continue to follow renal function, culture results, LOT, and antibiotic de-escalation plans   Georgina Pillion, PharmD, BCPS Clinical Pharmacist Pager: [phone] 11/23/2011 10:34 PM

## 2011-11-23 NOTE — Preoperative (Signed)
Beta Blockers   Reason not to administer Beta Blockers:Not Applicable 

## 2011-11-23 NOTE — Transfer of Care (Signed)
Immediate Anesthesia Transfer of Care Note  Patient: [name] [name]  Procedure(s) Performed: Procedure(s) (LRB): LUMBAR WOUND DEBRIDEMENT (N/A)  Patient Location: PACU  Anesthesia Type: General  Level of Consciousness: awake, alert  and oriented  Airway & Oxygen Therapy: Patient Spontanous Breathing and Patient connected to nasal cannula oxygen  Post-op Assessment: Report given to PACU RN and Post -op Vital signs reviewed and stable  Post vital signs: Reviewed and stable  Complications: No apparent anesthesia complications

## 2011-11-24 DIAGNOSIS — T8140XA Infection following a procedure, unspecified, initial encounter: Principal | ICD-10-CM

## 2011-11-24 DIAGNOSIS — Y849 Medical procedure, unspecified as the cause of abnormal reaction of the patient, or of later complication, without mention of misadventure at the time of the procedure: Secondary | ICD-10-CM

## 2011-11-24 MED ORDER — ESCITALOPRAM OXALATE 20 MG PO TABS
20.0000 mg | ORAL_TABLET | Freq: Every day | ORAL | Status: DC
  Administered 2011-11-24 – 2011-11-25 (×2): 20 mg via ORAL
  Filled 2011-11-24 (×3): qty 1

## 2011-11-24 MED ORDER — CIPROFLOXACIN IN D5W 400 MG/200ML IV SOLN
400.0000 mg | Freq: Two times a day (BID) | INTRAVENOUS | Status: DC
  Administered 2011-11-24 – 2011-11-25 (×2): 400 mg via INTRAVENOUS
  Filled 2011-11-24 (×5): qty 200

## 2011-11-24 NOTE — Clinical Social Work Note (Signed)
CSW received consult for SNF. Pt was discussed in progression with RN. PT is not recommending follow up. RNCM is aware and following. CSW is signing off as no further needs identified. Please reconsult if a need arises prior to discharge.   Dede Query, MSW, Theresia Majors [phone]

## 2011-11-24 NOTE — Evaluation (Signed)
Physical Therapy Evaluation Patient Details Name: Gian Ybarra MRN: [id number] DOB: 1975/06/25 Today's Date: 11/24/2011 Time: 4010-2725 PT Time Calculation (min): 11 min  PT Assessment / Plan / Recommendation Clinical Impression  Pt presents s/p I&D of a lumbar wound from recent back surgery. Pt is at baseline functional level, no acute PT needs. Will not follow    PT Assessment  Patent does not need any further PT services    Follow Up Recommendations  No PT follow up    Barriers to Discharge        lEquipment Recommendations  None recommended by OT;None recommended by PT          Precautions / Restrictions Precautions Precautions: Back Restrictions Weight Bearing Restrictions: No         Mobility  Bed Mobility Bed Mobility: Rolling Left;Left Sidelying to Sit;Sitting - Scoot to Edge of Bed Rolling Left: 6: Modified independent (Device/Increase time);With rail Left Sidelying to Sit: 6: Modified independent (Device/Increase time);With rails Sitting - Scoot to Edge of Bed: 6: Modified independent (Device/Increase time) Details for Bed Mobility Assistance: good log rolling technique. Pt. with use of walking stick to help at home with side-sit  Transfers Transfers: Sit to Stand;Stand to Sit Sit to Stand: 5: Supervision;With upper extremity assist;From bed Stand to Sit: 5: Supervision;To chair/3-in-1 Details for Transfer Assistance: Pt. with good technique and no bending Ambulation/Gait Ambulation/Gait Assistance: 6: Modified independent (Device/Increase time) Ambulation Distance (Feet): 150 Feet Assistive device: None Ambulation/Gait Assistance Details: Wide base of support with increased lateral sway, pts baseline gait Gait Pattern: Within Functional Limits;Wide base of support Gait velocity: normal gait speed Stairs: Yes Stairs Assistance: 5: Supervision Stairs Assistance Details (indicate cue type and reason): VC for proper sequencing Stair Management Technique:  No rails;Forwards Number of Stairs: 5       Visit Information  Last PT Received On: 11/24/11 Assistance Needed: +1 PT/OT Co-Evaluation/Treatment: Yes    Subjective Data      Prior Functioning  Home Living Lives With: Spouse Available Help at Discharge: Family Type of Home: House Home Access: Stairs to enter [employer] of Steps: 3 Entrance Stairs-Rails: Right Home Layout: One level Bathroom Shower/Tub: Walk-in shower;Door [employer] Toilet: Standard Bathroom Accessibility: Yes How Accessible: Accessible via walker Home Adaptive Equipment: Shower chair with back Prior Function Level of Independence: Independent Able to Take Stairs?: Yes Driving: Yes 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    Cognition  Overall Cognitive Status: Appears within functional limits for tasks assessed/performed Arousal/Alertness: Awake/alert Orientation Level: Appears intact for tasks assessed Behavior During Session: [facility] for tasks performed    Extremity/Trunk Assessment Right Lower Extremity Assessment RLE ROM/Strength/Tone: Within functional levels RLE Sensation: WFL - Light Touch Left Lower Extremity Assessment LLE ROM/Strength/Tone: Within functional levels LLE Sensation: WFL - Light Touch   Balance    End of Session PT - End of Session Equipment Utilized During Treatment: Gait belt Activity Tolerance: Patient tolerated treatment well Patient left: in chair;with call bell/phone within reach;with family/visitor present Nurse Communication: Mobility status   Milana Kidney 11/24/2011, 2:40 PM  11/24/2011 Milana Kidney DPT PAGER: [phone] OFFICE: [phone]

## 2011-11-24 NOTE — Evaluation (Signed)
Occupational Therapy Evaluation and Discharge Patient Details Name: [name] [name] MRN: [id number] DOB: 1976-03-29 Today's Date: 11/24/2011 Time: 4098-1191 OT Time Calculation (min): 16 min  OT Assessment / Plan / Recommendation Clinical Impression  Pt. presents s/p I and D of lumbar and with increased pain. All education completed with pt. and pt. moving well with ADLs and functional mobility. No futher OT needs identified at this time and will sign off acutely.    OT Assessment  Patient does not need any further OT services    Follow Up Recommendations  No OT follow up       Equipment Recommendations  None recommended by OT          Precautions / Restrictions Precautions Precautions: Back Restrictions Weight Bearing Restrictions: No   Pertinent Vitals/Pain "not bad" just received pain meds    ADL  Grooming: Performed;Set up;Supervision/safety Where Assessed - Grooming: Unsupported standing Lower Body Dressing: Performed;Set up;Supervision/safety Where Assessed - Lower Body Dressing: Unsupported sitting Toilet Transfer: Simulated;Supervision/safety Toilet Transfer Method: Sit to stand Toilet Transfer Equipment: Other (comment) (chair) Tub/Shower Transfer: Print production planner: Shower seat with back;Walk in shower Transfers/Ambulation Related to ADLs: Pt. supervision with ambulation ~25 without AD  ADL Comments: Pt. able to complete LB ADLs by crossing foot over opposite knee at EOB. Pt. able to recall back precautions and techniques for completing ADLs with following precautions.        Visit Information  Last OT Received On: 11/24/11 Assistance Needed: +1    Subjective Data  Subjective: ive been walking Patient Stated Goal: go home   Prior Functioning  Home Living Lives With: Spouse Available Help at Discharge: Family Type of Home: House Home Access: Stairs to enter Secretary/administrator of Steps: 3 Entrance  Stairs-Rails: Right Home Layout: One level Bathroom Shower/Tub: Walk-in shower;Door [employer] Toilet: Standard Bathroom Accessibility: Yes How Accessible: Accessible via walker Home Adaptive Equipment: Shower chair with back Prior Function Level of Independence: Independent Able to Take Stairs?: Yes Driving: Yes 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 Pt. with use of walking stick to help at home with side-sit  Transfers Transfers: Sit to Stand;Stand to Sit Sit to Stand: 5: Supervision;With upper extremity assist;From bed Stand to Sit: 5: Supervision;To chair/3-in-1 Details for Transfer Assistance: Pt. with good technique and no bending         End of Session OT - End of Session Activity Tolerance: Patient tolerated treatment well Patient left: Other (comment) (with P.T. ambulating in hallway) Nurse Communication: Mobility status   Macario Shear, OTR/L Pager [phone] 11/24/2011, 2:36 PM

## 2011-11-24 NOTE — Consult Note (Signed)
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 Length of therapy related to depth of infection. I we can be assured that this is superficial and not involving deeper structures, could consider shorter course (3-4 weeks). If possible that deeper, would rec 6 weeks.    Will follow with you,   Thank you so much for this interesting consult,   Johny Sax [phone]  Sergio Zawislak is an 36 y.o. male.  HPI: 36 yo M with hx of L5-S1 laminotomy as outpatient 5-21. He fell one week ago. By 6-3 he developed clear d/c from his wound, erythema and redness. Over 2 days prior to arrival, he developed f/c, and increasing pain. In ED found to have WBC 26.6. Since adm has had T100.9 max. He underwent I & D on 6-5.    Past Medical History  Diagnosis Date  . Hypertension   . GERD (gastroesophageal reflux disease)   . Depression   . Sleep apnea     cpap  . Headache     Past Surgical History  Procedure Date  . Lumbar spine surgery 11/08/2011  . Lumbar disc surgery   ergies:   Allergies  Allergen Reactions  . Penicillins Anaphylaxis    When he was 4    Medications:  Scheduled:   . bimatoprost  1 drop Both Eyes QHS  . escitalopram  20 mg Oral QHS  . lisinopril  40 mg Oral QHS  . sodium chloride  3 mL Intravenous Q12H  . vancomycin      . vancomycin  1,250 mg Intravenous Q12H  . DISCONTD: escitalopram  20 mg Oral Daily  . DISCONTD: lisinopril  40 mg Oral Daily    Social History:  reports that he quit smoking about 19 years ago. His smoking use included Cigarettes. He has a .9 pack-year smoking history. He has never used smokeless tobacco. He reports that he drinks alcohol. He reports that he does not use illicit drugs.  No family  history on file.  General AVW:UJWJXB BM although none in last 24h, normal urination, no dysphagia. See HPI.   Blood pressure 118/71, pulse 81, temperature 99.3 F (37.4 C), temperature source Oral, resp. rate 18, height 6' 0.84" (1.85 m), weight 133.811 kg (295 lb), SpO2 97.00%. General appearance: alert, cooperative and no distress Eyes: negative findings: pupils equal, round, reactive to light and accomodation Throat: normal findings: oropharynx pink & moist without lesions or evidence of thrush Neck: no adenopathy and supple, symmetrical, trachea midline Back: wound is tender, staples in place, no d/c. expected erythema around wound.  Lungs: clear to auscultation bilaterally Heart: regular rate and rhythm Abdomen: normal findings: bowel sounds normal and soft, non-tender Neurologic: Motor: 5/5 BLE   Results for orders placed during the hospital encounter of 11/23/11 (from the past 48 hour(s))  CBC     Status: Abnormal   Collection Time   11/23/11  1:49 PM      Component Value Range Comment   WBC 26.6 (*) 4.0 - 10.5 (K/uL)    RBC 4.62  4.22 - 5.81 (MIL/uL)    Hemoglobin 14.7  13.0 - 17.0 (g/dL)    HCT 14.7  82.9 - 56.2 (%)    MCV 90.3  78.0 - 100.0 (fL)    MCH 31.8  26.0 -  34.0 (pg)    MCHC 35.3  30.0 - 36.0 (g/dL)    RDW 16.1  09.6 - 04.5 (%)    Platelets 201  150 - 400 (K/uL)   APTT     Status: Normal   Collection Time   11/23/11  1:49 PM      Component Value Range Comment   aPTT 30  24 - 37 (seconds)   PROTIME-INR     Status: Normal   Collection Time   11/23/11  1:49 PM      Component Value Range Comment   Prothrombin Time 14.1  11.6 - 15.2 (seconds)    INR 1.07  0.00 - 1.49    WOUND CULTURE     Status: Normal (Preliminary result)   Collection Time   11/23/11  6:10 PM      Component Value Range Comment   Specimen Description WOUND BACK      Special Requests LUMBAR SUPERFICIAL      Gram Stain        Value: NO WBC SEEN     NO SQUAMOUS EPITHELIAL CELLS SEEN     RARE GRAM  POSITIVE RODS   Culture PENDING      Report Status PENDING     ANAEROBIC CULTURE     Status: Normal (Preliminary result)   Collection Time   11/23/11  6:10 PM      Component Value Range Comment   Specimen Description WOUND BACK      Special Requests LUMBAR SUPERFICIAL      Gram Stain        Value: NO WBC SEEN     NO SQUAMOUS EPITHELIAL CELLS SEEN     RARE GRAM POSITIVE RODS   Culture        Value: NO ANAEROBES ISOLATED; CULTURE IN PROGRESS FOR 5 DAYS   Report Status PENDING     WOUND CULTURE     Status: Normal (Preliminary result)   Collection Time   11/23/11  6:10 PM      Component Value Range Comment   Specimen Description WOUND BACK      Special Requests LUMBAR DEEP      Gram Stain        Value: NO WBC SEEN     NO SQUAMOUS EPITHELIAL CELLS SEEN     RARE GRAM POSITIVE COCCI IN PAIRS   Culture PENDING      Report Status PENDING     ANAEROBIC CULTURE     Status: Normal (Preliminary result)   Collection Time   11/23/11  6:10 PM      Component Value Range Comment   Specimen Description WOUND BACK      Special Requests LUMBAR DEEP      Gram Stain        Value: NO WBC SEEN     NO SQUAMOUS EPITHELIAL CELLS SEEN     RARE GRAM POSITIVE COCCI IN PAIRS   Culture        Value: NO ANAEROBES ISOLATED; CULTURE IN PROGRESS FOR 5 DAYS   Report Status PENDING     BASIC METABOLIC PANEL     Status: Abnormal   Collection Time   11/23/11  9:04 PM      Component Value Range Comment   Sodium 133 (*) 135 - 145 (mEq/L)    Potassium 4.0  3.5 - 5.1 (mEq/L)    Chloride 97  96 - 112 (mEq/L)    CO2 28  19 - 32 (mEq/L)  Glucose, Bld 136 (*) 70 - 99 (mg/dL)    BUN 12  6 - 23 (mg/dL)    Creatinine, Ser 1.61  0.50 - 1.35 (mg/dL)    Calcium 9.3  8.4 - 10.5 (mg/dL)    GFR calc non Af Amer >90  >90 (mL/min)    GFR calc Af Amer >90  >90 (mL/min)       Component Value Date/Time   SDES WOUND BACK 11/23/2011 1810   SDES WOUND BACK 11/23/2011 1810   SDES WOUND BACK 11/23/2011 1810   SDES WOUND BACK 11/23/2011  1810   SPECREQUEST LUMBAR SUPERFICIAL 11/23/2011 1810   SPECREQUEST LUMBAR SUPERFICIAL 11/23/2011 1810   SPECREQUEST LUMBAR DEEP 11/23/2011 1810   SPECREQUEST LUMBAR DEEP 11/23/2011 1810   CULT PENDING 11/23/2011 1810   CULT NO ANAEROBES ISOLATED; CULTURE IN PROGRESS FOR 5 DAYS 11/23/2011 1810   CULT PENDING 11/23/2011 1810   CULT NO ANAEROBES ISOLATED; CULTURE IN PROGRESS FOR 5 DAYS 11/23/2011 1810   REPTSTATUS PENDING 11/23/2011 1810   REPTSTATUS PENDING 11/23/2011 1810   REPTSTATUS PENDING 11/23/2011 1810   REPTSTATUS PENDING 11/23/2011 1810   No results found. Recent Results (from the past 240 hour(s))  WOUND CULTURE     Status: Normal (Preliminary result)   Collection Time   11/23/11  6:10 PM      Component Value Range Status Comment   Specimen Description WOUND BACK   Final    Special Requests LUMBAR SUPERFICIAL   Final    Gram Stain     Final    Value: NO WBC SEEN     NO SQUAMOUS EPITHELIAL CELLS SEEN     RARE GRAM POSITIVE RODS   Culture PENDING   Incomplete    Report Status PENDING   Incomplete   ANAEROBIC CULTURE     Status: Normal (Preliminary result)   Collection Time   11/23/11  6:10 PM      Component Value Range Status Comment   Specimen Description WOUND BACK   Final    Special Requests LUMBAR SUPERFICIAL   Final    Gram Stain     Final    Value: NO WBC SEEN     NO SQUAMOUS EPITHELIAL CELLS SEEN     RARE GRAM POSITIVE RODS   Culture     Final    Value: NO ANAEROBES ISOLATED; CULTURE IN PROGRESS FOR 5 DAYS   Report Status PENDING   Incomplete   WOUND CULTURE     Status: Normal (Preliminary result)   Collection Time   11/23/11  6:10 PM      Component Value Range Status Comment   Specimen Description WOUND BACK   Final    Special Requests LUMBAR DEEP   Final    Gram Stain     Final    Value: NO WBC SEEN     NO SQUAMOUS EPITHELIAL CELLS SEEN     RARE GRAM POSITIVE COCCI IN PAIRS   Culture PENDING   Incomplete    Report Status PENDING   Incomplete   ANAEROBIC CULTURE     Status:  Normal (Preliminary result)   Collection Time   11/23/11  6:10 PM      Component Value Range Status Comment   Specimen Description WOUND BACK   Final    Special Requests LUMBAR DEEP   Final    Gram Stain     Final    Value: NO WBC SEEN     NO SQUAMOUS EPITHELIAL CELLS SEEN  RARE GRAM POSITIVE COCCI IN PAIRS   Culture     Final    Value: NO ANAEROBES ISOLATED; CULTURE IN PROGRESS FOR 5 DAYS   Report Status PENDING   Incomplete       11/24/2011, 3:51 PM     LOS: 1 day

## 2011-11-24 NOTE — Care Management Note (Signed)
    Page 1 of 1   11/24/2011     3:39:05 PM   CARE MANAGEMENT NOTE 11/24/2011  Patient:  [name],[name]   Account Number:  [id number]  Date Initiated:  11/24/2011  Documentation initiated by:  Onnie Boer  Subjective/Objective Assessment:   PT WAS ADMITTED WITH HEADACHE, FEVER AND PAIN     Action/Plan:   PROGRESION OF CARE AND DISCHARGE PLANNING   Anticipated DC Date:  11/25/2011   Anticipated DC Plan:  HOME W HOME HEALTH SERVICES      DC Planning Services  CM consult      Choice offered to / List presented to:             Status of service:  In process, will continue to follow Medicare Important Message given?   (If response is "NO", the following Medicare IM given date fields will be blank) Date Medicare IM given:   Date Additional Medicare IM given:    Discharge Disposition:    Per UR Regulation:  Reviewed for med. necessity/level of care/duration of stay  If discussed at Long Length of Stay Meetings, dates discussed:    Comments:  11/24/11 Onnie Boer, RN, BSN [id number] PT WAS ADMITTED FOR I&D OF WOUND AND CX.  PTA PT WAS AT HOME WITH SELF CARE. INFECTIOUS DISEASE TO SEE PT.  WILL F/U ON DC NEEDS

## 2011-11-25 ENCOUNTER — Encounter (HOSPITAL_COMMUNITY): Payer: Self-pay | Admitting: Neurosurgery

## 2011-11-25 MED ORDER — SODIUM CHLORIDE 0.9 % IJ SOLN
10.0000 mL | INTRAMUSCULAR | Status: DC | PRN
  Administered 2011-11-26: 10 mL

## 2011-11-25 MED ORDER — ENOXAPARIN SODIUM 40 MG/0.4ML ~~LOC~~ SOLN
40.0000 mg | Freq: Every day | SUBCUTANEOUS | Status: DC
  Administered 2011-11-25 – 2011-11-26 (×2): 40 mg via SUBCUTANEOUS
  Filled 2011-11-25 (×2): qty 0.4

## 2011-11-25 NOTE — Progress Notes (Signed)
Peripherally Inserted Central Catheter/Midline Placement  The IV Nurse has discussed with the patient and/or persons authorized to consent for the patient, the purpose of this procedure and the potential benefits and risks involved with this procedure.  The benefits include less needle sticks, lab draws from the catheter and patient may be discharged home with the catheter.  Risks include, but not limited to, infection, bleeding, blood clot (thrombus formation), and puncture of an artery; nerve damage and irregular heat beat.  Alternatives to this procedure were also discussed.  PICC/Midline Placement Documentation        [name] Horton 11/25/2011, 11:12 AM

## 2011-11-25 NOTE — Progress Notes (Signed)
Patient ID: [name] [name], male   DOB: [birth year], 37 y.o.   MRN: [id number] Temp back to normal. Seen by ID. Some pain left leg, no weakness

## 2011-11-25 NOTE — Progress Notes (Signed)
INFECTIOUS DISEASE PROGRESS NOTE  ID: [name] [name] is a 36 y.o. male with Active Problems:  * No active hospital problems. *   Subjective: Without complaints  Abtx:  Anti-infectives     Start     Dose/Rate Route Frequency Ordered Stop   11/24/11 1700   ciprofloxacin (CIPRO) IVPB 400 mg        400 mg 200 mL/hr over 60 Minutes Intravenous Every 12 hours 11/24/11 1624     11/24/11 0600   vancomycin (VANCOCIN) 1,250 mg in sodium chloride 0.9 % 250 mL IVPB        1,250 mg 166.7 mL/hr over 90 Minutes Intravenous Every 12 hours 11/23/11 2235     11/23/11 1737   vancomycin (VANCOCIN) 1 GM/200ML IVPB     Comments: KEY, JENNIFER: cabinet override         11/23/11 1737 11/23/11 1742          Medications:  Scheduled:   . bimatoprost  1 drop Both Eyes QHS  . ciprofloxacin  400 mg Intravenous Q12H  . escitalopram  20 mg Oral QHS  . lisinopril  40 mg Oral QHS  . sodium chloride  3 mL Intravenous Q12H  . vancomycin  1,250 mg Intravenous Q12H  . DISCONTD: escitalopram  20 mg Oral Daily    Objective: Vital signs in last 24 hours: Temp:  [97.8 F (36.6 C)-99.3 F (37.4 C)] 98.5 F (36.9 C) (06/07 1039) Pulse Rate:  [71-81] 71  (06/07 1039) Resp:  [18] 18  (06/07 1039) BP: (92-118)/(55-74) 99/63 mmHg (06/07 1039) SpO2:  [94 %-98 %] 94 % (06/07 1039)   General appearance: alert, cooperative and no distress Back: wound dressed, dressings clean  Lab Results  Basename 11/23/11 2104 11/23/11 1349  WBC -- 26.6*  HGB -- 14.7  HCT -- 41.7  NA 133* --  K 4.0 --  CL 97 --  CO2 28 --  BUN 12 --  CREATININE 0.99 --  GLU -- --   Liver Panel No results found for this basename: PROT:2,ALBUMIN:2,AST:2,ALT:2,ALKPHOS:2,BILITOT:2,BILIDIR:2,IBILI:2 in the last 72 hours Sedimentation Rate No results found for this basename: ESRSEDRATE in the last 72 hours C-Reactive Protein No results found for this basename: CRP:2 in the last 72 hours  Microbiology: Recent Results (from the  past 240 hour(s))  WOUND CULTURE     Status: Normal (Preliminary result)   Collection Time   11/23/11  6:10 PM      Component Value Range Status Comment   Specimen Description WOUND BACK   Final    Special Requests LUMBAR SUPERFICIAL   Final    Gram Stain     Final    Value: NO WBC SEEN     NO SQUAMOUS EPITHELIAL CELLS SEEN     RARE GRAM POSITIVE RODS   Culture Culture reincubated for better growth   Final    Report Status PENDING   Incomplete   ANAEROBIC CULTURE     Status: Normal (Preliminary result)   Collection Time   11/23/11  6:10 PM      Component Value Range Status Comment   Specimen Description WOUND BACK   Final    Special Requests LUMBAR SUPERFICIAL   Final    Gram Stain     Final    Value: NO WBC SEEN     NO SQUAMOUS EPITHELIAL CELLS SEEN     RARE GRAM POSITIVE RODS   Culture     Final    Value: NO ANAEROBES ISOLATED; CULTURE  IN PROGRESS FOR 5 DAYS   Report Status PENDING   Incomplete   WOUND CULTURE     Status: Normal (Preliminary result)   Collection Time   11/23/11  6:10 PM      Component Value Range Status Comment   Specimen Description WOUND BACK   Final    Special Requests LUMBAR DEEP   Final    Gram Stain     Final    Value: NO WBC SEEN     NO SQUAMOUS EPITHELIAL CELLS SEEN     RARE GRAM POSITIVE COCCI IN PAIRS   Culture PENDING   Incomplete    Report Status PENDING   Incomplete   ANAEROBIC CULTURE     Status: Normal (Preliminary result)   Collection Time   11/23/11  6:10 PM      Component Value Range Status Comment   Specimen Description WOUND BACK   Final    Special Requests LUMBAR DEEP   Final    Gram Stain     Final    Value: NO WBC SEEN     NO SQUAMOUS EPITHELIAL CELLS SEEN     RARE GRAM POSITIVE COCCI IN PAIRS   Culture     Final    Value: NO ANAEROBES ISOLATED; CULTURE IN PROGRESS FOR 5 DAYS   Report Status PENDING   Incomplete   CULTURE, BLOOD (ROUTINE X 2)     Status: Normal (Preliminary result)   Collection Time   11/23/11  9:08 PM       Component Value Range Status Comment   Specimen Description BLOOD LEFT ARM   Final    Special Requests BOTTLES DRAWN AEROBIC AND ANAEROBIC 10CC   Final    Culture  Setup Time [id number]   Final    Culture     Final    Value:        BLOOD CULTURE RECEIVED NO GROWTH TO DATE CULTURE WILL BE HELD FOR 5 DAYS BEFORE ISSUING A FINAL NEGATIVE REPORT   Report Status PENDING   Incomplete   CULTURE, BLOOD (ROUTINE X 2)     Status: Normal (Preliminary result)   Collection Time   11/23/11  9:09 PM      Component Value Range Status Comment   Specimen Description BLOOD LEFT HAND   Final    Special Requests     Final    Value: BOTTLES DRAWN AEROBIC AND ANAEROBIC 8CC AER 5CC ANA   Culture  Setup Time [id number]   Final    Culture     Final    Value:        BLOOD CULTURE RECEIVED NO GROWTH TO DATE CULTURE WILL BE HELD FOR 5 DAYS BEFORE ISSUING A FINAL NEGATIVE REPORT   Report Status PENDING   Incomplete     Studies/Results: No results found.   Assessment/Plan: Wound Infection-  Abundant group B strep Hx of severe PEN allergy in childhood  would- place [facility] Plan for 4 weeks of vanco  Will d/c cipro Please have pt f/u in ID clinic in 3 weeks.     Johny Sax Infectious Diseases [phone] 11/25/2011, 1:25 PM   LOS: 2 days

## 2011-11-26 LAB — WOUND CULTURE

## 2011-11-26 MED ORDER — HEPARIN SOD (PORK) LOCK FLUSH 100 UNIT/ML IV SOLN
250.0000 [IU] | INTRAVENOUS | Status: AC | PRN
  Administered 2011-11-26: 250 [IU]

## 2011-11-26 MED ORDER — OXYCODONE-ACETAMINOPHEN 5-325 MG PO TABS: 1.0000 | ORAL_TABLET | ORAL | Status: AC | PRN

## 2011-11-26 MED ORDER — VANCOMYCIN HCL 1000 MG IV SOLR
1500.0000 mg | Freq: Three times a day (TID) | INTRAVENOUS | Status: DC
  Administered 2011-11-26 (×2): 1500 mg via INTRAVENOUS
  Filled 2011-11-26 (×4): qty 1500

## 2011-11-26 MED ORDER — VANCOMYCIN HCL 1000 MG IV SOLR: 2000.0000 mg | Freq: Two times a day (BID) | INTRAVENOUS | Status: DC

## 2011-11-26 NOTE — Progress Notes (Signed)
Patient given AVS. Instructed to call Dr Cassandria Santee office Monday to schedule a f/u visit. Also instructed to call the ID clinic on Monday and schedule a f/u in approx 3 weeks (office number given). Patient and significant other stated understanding.  Minor, Yvette Rack

## 2011-11-26 NOTE — Progress Notes (Signed)
ANTIBIOTIC CONSULT NOTE - FOLLOW UP  Pharmacy Consult for vancomycin Indication: wound infection  Labs:  [facility] 11/23/11 2104 11/23/11 1349  WBC -- 26.6*  HGB -- 14.7  PLT -- 201  LABCREA -- --  CREATININE 0.99 --   Estimated Creatinine Clearance: 146.2 ml/min (by C-G formula based on Cr of 0.99).  Basename 11/26/11 0410  VANCOTROUGH 8.4*  VANCOPEAK --  VANCORANDOM --  GENTTROUGH --  GENTPEAK --  GENTRANDOM --  TOBRATROUGH --  TOBRAPEAK --  TOBRARND --  AMIKACINPEAK --  AMIKACINTROU --  AMIKACIN --     Assessment: 36yo male subtherapeutic on vancomycin for wound infection with level drawn ~2hr prior to dose due so true trough even lower.  Currently starting day 4 with plan of 28 days of therapy.  Goal of Therapy:  Vancomycin trough level 10-15 mcg/ml  Plan:  Will change vancomycin to 1500mg  IV Q8H for calculated trough of 15 and continue to monitor.  (Note: If home health would prefer Q12H dosing, 2000mg  Q12H would yield trough of ~11 if lower trough remains appropriate.)  Colleen Can PharmD BCPS 11/26/2011,5:12 AM

## 2011-11-26 NOTE — Discharge Summary (Signed)
Physician Discharge Summary  Patient ID: [name] [name] MRN: [id number] DOB/AGE: 38/03/1976 37 y.o.  Admit date: 11/23/2011 Discharge date: 11/26/2011  Admission Diagnoses: Postoperative lumbar wound infection  Discharge Diagnoses: Postoperative lumbar wound infection Active Problems:  * No active hospital problems. *    Discharged Condition: fair  Hospital Course: Patient was admitted to undergo surgical debridement of postoperative lumbar wound infection. The patient had lumbar laminectomy near the end of May and had been doing fairly well until he noticed progressive increase in pain in his back and most recently this was complicated by fever he developed drainage and lumbar spine and was advised regarding admission and surgical debridement  Consults: Infectious diseases  Significant Diagnostic Studies: None  Treatments: surgery: Debridement of lumbar wound  Discharge Exam: Blood pressure 96/55, pulse 65, temperature 97.4 F (36.3 C), temperature source Oral, resp. rate 20, height 6' 0.84" (1.85 m), weight 133.811 kg (295 lb), SpO2 97.00%. Incision with mild erythema slight drainage from the staple sites. Less edema than was previously noted. Vital signs have been stable motor function is intact in lower extremities station and gait intact.  Disposition:  home on home health antibiotics  Discharge Orders    Future Orders Please Complete By Expires   Diet - low sodium heart healthy      Increase activity slowly      Discharge instructions      Comments:   Change dressing daily paint incision with Betadine.   Call MD for:  redness, tenderness, or signs of infection (pain, swelling, redness, odor or green/yellow discharge around incision site)      Call MD for:  persistant nausea and vomiting      Call MD for:  temperature >100.4        Medication List  As of 11/26/2011 11:36 AM   TAKE these medications         acetaminophen 500 MG tablet   Commonly known as: TYLENOL   Take  1,000 mg by mouth every 6 (six) hours as needed. For pain      bimatoprost 0.01 % Soln   Commonly known as: LUMIGAN   Place 1 drop into both eyes at bedtime.      diazepam 10 MG tablet   Commonly known as: VALIUM   Take 10 mg by mouth every 6 (six) hours as needed. For muscle spasms      escitalopram 20 MG tablet   Commonly known as: LEXAPRO   Take 20 mg by mouth daily.      lisinopril 40 MG tablet   Commonly known as: PRINIVIL,ZESTRIL   Take 40 mg by mouth daily.      oxyCODONE 15 MG immediate release tablet   Commonly known as: ROXICODONE   Take 15 mg by mouth every 4 (four) hours as needed. For pain      oxyCODONE-acetaminophen 5-325 MG per tablet   Commonly known as: PERCOCET   Take 1-2 tablets by mouth every 4 (four) hours as needed for pain.      sodium chloride 0.9 % SOLN 500 mL with vancomycin 1000 MG SOLR 2,000 mg   Inject 2,000 mg into the vein every 12 (twelve) hours.             SignedStefani Dama 11/26/2011, 11:36 AM

## 2011-11-26 NOTE — Progress Notes (Signed)
Dr Danielle Dess said it was ok to give patient 1 remaining dose of vanc instead of the 2 scheduled remaining as patient will be DC'd today and home health will see him tomorrow.   Minor, Yvette Rack

## 2011-11-26 NOTE — Progress Notes (Signed)
   CARE MANAGEMENT NOTE 11/26/2011  Patient:  [name] [name],[name] [name]   Account Number:  [id number]  Date Initiated:  11/24/2011  Documentation initiated by:  Onnie Boer  Subjective/Objective Assessment:   PT WAS ADMITTED WITH HEADACHE, FEVER AND PAIN     Action/Plan:   PROGRESION OF CARE AND DISCHARGE PLANNING  HH agency will be determined by Worker's Comp   Anticipated DC Date:  11/25/2011   Anticipated DC Plan:  HOME W HOME HEALTH SERVICES      DC Planning Services  CM consult      [facility] Choice  HOME HEALTH   Choice offered to / List presented to:  NA        HH arranged  HH-1 RN  IV Antibiotics      HH agency  [facility]   Status of service:  Completed, signed off Medicare Important Message given?   (If response is "NO", the following Medicare IM given date fields will be blank) Date Medicare IM given:   Date Additional Medicare IM given:    Discharge Disposition:  HOME W HOME HEALTH SERVICES  Per UR Regulation:  Reviewed for med. necessity/level of care/duration of stay  If discussed at Long Length of Stay Meetings, dates discussed:    Comments:  11/26/2011 1400 Confirmed with Genevieve Norlander that orders were received from [facility]. States she recieved referral and RN scheduled to go out on 6/9 in am to set up IV abx. Contacted [facility] # [phone] and med was delivered to home. Put contact info on d/c instruction for [place] and [facility]. Made pt's Unit RN that d/c planning is complete. Unit RN states pt has dose scheduled for 6pm today and will d/c after that dose. Spoke to wife and gave updates and she did verify med was at her home. Isidoro Donning RN CCM Case Mgmt phone [phone]   11/26/2011 1130 Contacted Genevieve Norlander for [facility] RN for scheduled IV abx for d/c on 6/9. Spoke to pt and gave permission to speak to wife. States she was not sure if meds was delivered to home. Isidoro Donning RN CCM Case Mgmt phone [phone]  11/25/2011 1830 Received call from  [place]. States Genevieve Norlander will be [facility] provider and [facility] will deliver IV abx to home. Left her contact info at [phone] ext [phone]. Isidoro Donning RN CCM Case Mgmt phone [phone]  11/24/11 Onnie Boer, RN, BSN 1537 PT WAS ADMITTED FOR I&D OF WOUND AND CX.  PTA PT WAS AT HOME WITH SELF CARE. INFECTIOUS DISEASE TO SEE PT.  WILL F/U ON DC NEEDS

## 2011-11-27 LAB — WOUND CULTURE

## 2011-11-28 LAB — ANAEROBIC CULTURE
Gram Stain: NONE SEEN
Gram Stain: NONE SEEN

## 2011-11-30 LAB — CULTURE, BLOOD (ROUTINE X 2)
Culture  Setup Time: 201306060334
Culture: NO GROWTH
Culture: NO GROWTH

## 2011-12-14 ENCOUNTER — Ambulatory Visit (INDEPENDENT_AMBULATORY_CARE_PROVIDER_SITE_OTHER): Payer: Worker's Compensation | Admitting: Internal Medicine

## 2011-12-14 ENCOUNTER — Telehealth: Payer: Self-pay | Admitting: *Deleted

## 2011-12-14 VITALS — BP 122/75 | HR 82 | Temp 98.2°F | Ht 73.0 in | Wt 300.0 lb

## 2011-12-14 DIAGNOSIS — M519 Unspecified thoracic, thoracolumbar and lumbosacral intervertebral disc disorder: Secondary | ICD-10-CM

## 2011-12-14 DIAGNOSIS — M4646 Discitis, unspecified, lumbar region: Secondary | ICD-10-CM

## 2011-12-14 NOTE — Telephone Encounter (Signed)
Patient's IV infusion called and [name] [name] advised that the patient called them and advised he would be on IV meds longer than originally indicated she wanted to know when the end date would be. After reading the office note advised her provider stated possibly a total of 6 weeks needed but that depends on lab test he had run today. [name] [name] advised that it would be a total of 6 as he has already completed 3 and if we change to oral because of the test results we should give them a call. Advised will do.

## 2011-12-14 NOTE — Progress Notes (Signed)
Patient ID: [name] [name], male   DOB: [birth year], 36 y.o.   MRN: [id number]    [facility] for Infectious Disease  Patient Active Problem List  Diagnosis  . Discitis of lumbar region    Patient's Medications  New Prescriptions   No medications on file  Previous Medications   ACETAMINOPHEN (TYLENOL) 500 MG TABLET    Take 1,000 mg by mouth every 6 (six) hours as needed. For pain   BIMATOPROST (LUMIGAN) 0.01 % SOLN    Place 1 drop into both eyes at bedtime.   DIAZEPAM (VALIUM) 10 MG TABLET    Take 10 mg by mouth every 6 (six) hours as needed. For muscle spasms   ESCITALOPRAM (LEXAPRO) 20 MG TABLET    Take 20 mg by mouth daily.   LISINOPRIL (PRINIVIL,ZESTRIL) 40 MG TABLET    Take 40 mg by mouth daily.   OXYCODONE (ROXICODONE) 15 MG IMMEDIATE RELEASE TABLET    Take 15 mg by mouth every 4 (four) hours as needed. For pain   SODIUM CHLORIDE 0.9 % SOLN 500 ML WITH VANCOMYCIN 1000 MG SOLR 2,000 MG    Inject 2,000 mg into the vein every 12 (twelve) hours.  Modified Medications   No medications on file  Discontinued Medications   No medications on file    Subjective: [name] [name] is in for a hospital followup visit. He underwent surgery he at the L5-S1 level on May 13. A few weeks postop he developed severe lower back pain and was readmitted on June 5. Wound cultures grew group B strep and he underwent incision and drainage. Dr. Cassandria Santee operative note indicated that the infection went down to the operative site. He has a penicillin allergy so he was discharged home on IV vancomycin. His pain was initially 10 out of 10. He is feeling much better now and the most his pain is 2/10. He has not needed any pain medication recently and is up to walking 2 miles per day. 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  Objective: Temp: 98.2 F (36.8 C) (06/26 1352) Temp src: Oral (06/26 1352) BP: 122/75 mmHg (06/26 1352) Pulse Rate: 82   (06/26 1352)  General: He is in good spirits in no distress Skin: Left arm PICC site appears normal Lungs: Clear Cor: Regular S1 and S2 no murmurs Lumbar incision is healing nicely. His staples are out. There is no wound drainage, redness or fluctuance.   Assessment: He is responding well to incision and drainage in 3 weeks of IV vancomycin. He probably needs a total of 6 weeks of therapy. I will check inflammatory markers today and consider conversion to oral levofloxacin to complete his therapy.  Plan: 1. Continue vancomycin for now 2. Check sedimentation rate and C-reactive protein   Cliffton Asters, MD [facility] for Infectious Disease [facility] [phone] pager   [phone] cell 12/14/2011, 2:18 PM

## 2011-12-15 ENCOUNTER — Other Ambulatory Visit: Payer: Self-pay | Admitting: Internal Medicine

## 2011-12-15 ENCOUNTER — Telehealth: Payer: Self-pay | Admitting: Internal Medicine

## 2011-12-15 DIAGNOSIS — M4646 Discitis, unspecified, lumbar region: Secondary | ICD-10-CM

## 2011-12-15 MED ORDER — LEVOFLOXACIN 750 MG PO TABS: 750.0000 mg | ORAL_TABLET | Freq: Every day | ORAL | Status: AC

## 2011-12-15 NOTE — Telephone Encounter (Signed)
[name] [name] continues to do well. His C-reactive protein was slightly elevated at 0.63 and his sedimentation rate was slightly elevated at 23. I think it is reasonable at this point to convert him over to oral levofloxacin, discontinue vancomycin and have the PICC removed. I will plan on 3 more weeks of therapy.

## 2011-12-15 NOTE — Telephone Encounter (Signed)
Phone call to [facility], [phone].  Verbal and written order from Dr. Orvan Falconer given.  Faxed order to [phone].

## 2011-12-16 ENCOUNTER — Encounter: Payer: Self-pay | Admitting: Internal Medicine

## 2012-01-06 ENCOUNTER — Other Ambulatory Visit: Payer: Self-pay | Admitting: Family Medicine

## 2012-01-25 ENCOUNTER — Encounter: Payer: Self-pay | Admitting: Internal Medicine

## 2012-01-25 ENCOUNTER — Ambulatory Visit (INDEPENDENT_AMBULATORY_CARE_PROVIDER_SITE_OTHER): Payer: Worker's Compensation | Admitting: Internal Medicine

## 2012-01-25 VITALS — BP 115/78 | HR 73 | Temp 98.9°F | Ht 73.0 in | Wt 306.0 lb

## 2012-01-25 DIAGNOSIS — M519 Unspecified thoracic, thoracolumbar and lumbosacral intervertebral disc disorder: Secondary | ICD-10-CM

## 2012-01-25 DIAGNOSIS — M4646 Discitis, unspecified, lumbar region: Secondary | ICD-10-CM

## 2012-01-25 NOTE — Progress Notes (Signed)
Patient ID: [name] [name], male   DOB: 1975/10/23, 36 y.o.   MRN: [id number]    [facility] for Infectious Disease  Patient Active Problem List  Diagnosis  . Discitis of lumbar region    Patient's Medications  New Prescriptions   No medications on file  Previous Medications   ACETAMINOPHEN (TYLENOL) 500 MG TABLET    Take 1,000 mg by mouth every 6 (six) hours as needed. For pain   BIMATOPROST (LUMIGAN) 0.01 % SOLN    Place 1 drop into both eyes at bedtime.   DIAZEPAM (VALIUM) 10 MG TABLET    Take 10 mg by mouth every 6 (six) hours as needed. For muscle spasms   ESCITALOPRAM (LEXAPRO) 20 MG TABLET    Take 20 mg by mouth daily.   LISINOPRIL (PRINIVIL,ZESTRIL) 40 MG TABLET    Take 40 mg by mouth daily.   OXYCODONE (ROXICODONE) 15 MG IMMEDIATE RELEASE TABLET    Take 15 mg by mouth every 4 (four) hours as needed. For pain  Modified Medications   No medications on file  Discontinued Medications   No medications on file    Subjective: [name] [name] is in for his routine followup visit. He completed 6 weeks of antibiotic therapy in mid July for his group B streptococcal postoperative lumbar infection. He is back at work full time. Recently he has had some increase transient pain while working. Dr. Jeral Fruit started him back on a narcotic pain medication. He is not sure if his on Roxicodone or hydrocodone. He does not need to take the medication every day. He rates the pain is very mild and nowhere near what it was like when his infection was first diagnosed. He has not had any fever.  Objective: Temp: 98.9 F (37.2 C) (08/07 1553) Temp src: Oral (08/07 1553) BP: 115/78 mmHg (08/07 1553) Pulse Rate: 73  (08/07 1553)  General: He is in no distress Skin: His lumbar incision is healed without any evidence of inflammation or infection   Lab Results Lab Results  Component Value Date   CRP 0.63* 12/14/2011    Lab Results  Component Value Date   ESRSEDRATE 23* 12/14/2011       Assessment: I suspect that his lumbar infection has been cured. His mild intermittent pain now is probably related to increased activity. I will continue observation off of antibiotics repeat his inflammatory markers. He is to call me if his pain continues to increase.  Plan: 1. Continue observation off of antibiotics 2. Repeat sedimentation rate and C-reactive protein 3. Followup in 6-8 weeks   Cliffton Asters, MD [facility] for Infectious Disease [facility] [phone] pager   [phone] cell 01/25/2012, 4:20 PM

## 2012-01-26 LAB — SEDIMENTATION RATE: Sed Rate: 4 mm/hr (ref 0–16)

## 2012-01-30 ENCOUNTER — Telehealth: Payer: Self-pay | Admitting: *Deleted

## 2012-01-30 NOTE — Telephone Encounter (Signed)
Patient called for his lab results, all were normal and patient notified. Wendall Mola CMA

## 2012-02-20 ENCOUNTER — Observation Stay: Payer: Self-pay | Admitting: Internal Medicine

## 2012-02-20 LAB — BASIC METABOLIC PANEL
Anion Gap: 5 — ABNORMAL LOW (ref 7–16)
Chloride: 101 mmol/L (ref 98–107)
Co2: 29 mmol/L (ref 21–32)
Creatinine: 1.15 mg/dL (ref 0.60–1.30)
Osmolality: 272 (ref 275–301)
Potassium: 4 mmol/L (ref 3.5–5.1)

## 2012-02-20 LAB — CBC
MCHC: 33.5 g/dL (ref 32.0–36.0)
Platelet: 276 10*3/uL (ref 150–440)
RBC: 4.83 10*6/uL (ref 4.40–5.90)
RDW: 13.9 % (ref 11.5–14.5)

## 2012-02-20 LAB — TROPONIN I: Troponin-I: <0.02 ng/mL

## 2012-02-20 LAB — CK TOTAL AND CKMB (NOT AT ARMC): CK-MB: 2.3 ng/mL (ref 0.5–3.6)

## 2012-02-21 LAB — COMPREHENSIVE METABOLIC PANEL
Albumin: 3.8 g/dL (ref 3.4–5.0)
Alkaline Phosphatase: 92 U/L (ref 50–136)
Anion Gap: 7 (ref 7–16)
BUN: 19 mg/dL — ABNORMAL HIGH (ref 7–18)
Bilirubin,Total: 0.3 mg/dL (ref 0.2–1.0)
Calcium, Total: 9.3 mg/dL (ref 8.5–10.1)
Chloride: 103 mmol/L (ref 98–107)
Co2: 24 mmol/L (ref 21–32)
Creatinine: 1.26 mg/dL (ref 0.60–1.30)
EGFR (African American): >60
EGFR (Non-African Amer.): >60
Potassium: 4.2 mmol/L (ref 3.5–5.1)
SGOT(AST): 33 U/L (ref 15–37)
SGPT (ALT): 71 U/L (ref 12–78)
Total Protein: 7.9 g/dL (ref 6.4–8.2)

## 2012-02-21 LAB — CBC WITH DIFFERENTIAL/PLATELET
Basophil #: 0 10*3/uL (ref 0.0–0.1)
Basophil %: 0.4 %
Eosinophil #: 0 10*3/uL (ref 0.0–0.7)
HGB: 14.9 g/dL (ref 13.0–18.0)
Lymphocyte #: 0.7 10*3/uL — ABNORMAL LOW (ref 1.0–3.6)
Lymphocyte %: 8.1 %
MCHC: 33.7 g/dL (ref 32.0–36.0)
MCV: 93 fL (ref 80–100)
Monocyte #: 0.1 x10 3/mm — ABNORMAL LOW (ref 0.2–1.0)
Monocyte %: 0.9 %
Neutrophil #: 7.8 10*3/uL — ABNORMAL HIGH (ref 1.4–6.5)
Neutrophil %: 90.6 %
Platelet: 272 10*3/uL (ref 150–440)
RBC: 4.78 10*6/uL (ref 4.40–5.90)

## 2012-02-21 LAB — CK-MB
CK-MB: 1.7 ng/mL (ref 0.5–3.6)
CK-MB: 1.7 ng/mL (ref 0.5–3.6)

## 2012-02-21 LAB — TROPONIN I
Troponin-I: <0.02 ng/mL
Troponin-I: <0.02 ng/mL

## 2012-02-21 LAB — LIPID PANEL
HDL Cholesterol: 44 mg/dL (ref 40–60)
Triglycerides: 101 mg/dL (ref 0–200)
VLDL Cholesterol, Calc: 20 mg/dL (ref 5–40)

## 2012-03-28 ENCOUNTER — Ambulatory Visit: Payer: Self-pay | Admitting: Internal Medicine

## 2012-04-03 ENCOUNTER — Ambulatory Visit (INDEPENDENT_AMBULATORY_CARE_PROVIDER_SITE_OTHER): Payer: Worker's Compensation | Admitting: Internal Medicine

## 2012-04-03 VITALS — BP 117/82 | HR 93 | Temp 99.1°F | Ht 73.0 in | Wt 308.2 lb

## 2012-04-03 DIAGNOSIS — M519 Unspecified thoracic, thoracolumbar and lumbosacral intervertebral disc disorder: Secondary | ICD-10-CM

## 2012-04-03 DIAGNOSIS — M4646 Discitis, unspecified, lumbar region: Secondary | ICD-10-CM

## 2012-04-03 NOTE — Progress Notes (Signed)
Patient ID: [name] [name], male   DOB: Jul 17, 1975, 36 y.o.   MRN: [id number]    [facility] for Infectious Disease  Patient Active Problem List  Diagnosis  . Discitis of lumbar region    Patient's Medications  New Prescriptions   No medications on file  Previous Medications   ACETAMINOPHEN (TYLENOL) 500 MG TABLET    Take 1,000 mg by mouth every 6 (six) hours as needed. For pain   BIMATOPROST (LUMIGAN) 0.01 % SOLN    Place 1 drop into both eyes at bedtime.   DIAZEPAM (VALIUM) 10 MG TABLET    Take 10 mg by mouth every 6 (six) hours as needed. For muscle spasms   ESCITALOPRAM (LEXAPRO) 20 MG TABLET    Take 20 mg by mouth daily.   LISINOPRIL (PRINIVIL,ZESTRIL) 40 MG TABLET    Take 40 mg by mouth daily.   OXYCODONE (ROXICODONE) 15 MG IMMEDIATE RELEASE TABLET    Take 15 mg by mouth every 4 (four) hours as needed. For pain  Modified Medications   No medications on file  Discontinued Medications   No medications on file    Subjective: [name] [name] is in for his routine followup visit. He completed 6 weeks of IV antibiotic therapy about 3 months ago for his postoperative lumbar group B strep infection. He is feeling much better. He has resumed his usual activities. He is not requiring any pain medications. He's had no fever or significant back pain other than an occasional twinge.  Objective: Temp: 99.1 F (37.3 C) (10/15 1454) Temp src: Oral (10/15 1454) BP: 117/82 mmHg (10/15 1454) Pulse Rate: 93  (10/15 1454)  General: he is in good spirits and in no distress Skin: no rash His lumbar incision is healed   Lab Results  Component Value Date   CRP <0.5 01/25/2012    Lab Results  Component Value Date   ESRSEDRATE 4 01/25/2012     Assessment: [name] [name]'s group B strep lumbar infection has been cured.  Plan: 1. Observe off of antibiotics 2. Followup here as needed   Cliffton Asters, MD [facility] for Infectious Disease [facility] [phone] pager   [phone]  cell 04/03/2012, 4:01 PM

## 2012-07-02 LAB — TSH: TSH: 2.11 u[IU]/mL (ref 0.41–5.90)

## 2012-07-02 LAB — HEPATIC FUNCTION PANEL
ALT: 46 U/L — AB (ref 10–40)
AST: 23 U/L (ref 14–40)
Alkaline Phosphatase: 75 U/L (ref 25–125)
Bilirubin, Total: 0.2 mg/dL

## 2012-07-02 LAB — CBC AND DIFFERENTIAL
Neutrophils Absolute: 3 /uL
WBC: 6.4 10*3/mL

## 2012-07-02 LAB — BASIC METABOLIC PANEL
BUN: 16 mg/dL (ref 4–21)
Creatinine: 0.9 mg/dL (ref 0.6–1.3)
GLUCOSE: 105 mg/dL
Sodium: 139 mmol/L (ref 137–147)

## 2013-01-21 ENCOUNTER — Ambulatory Visit: Payer: Self-pay | Admitting: Family Medicine

## 2014-10-07 NOTE — Discharge Summary (Signed)
PATIENT NAME:  [name] [name], [name] [name] MR#:  [id number] DATE OF BIRTH:  11/01/1975  DATE OF ADMISSION:  09/02/2[name]013 DATE OF DISCHARGE:  02/21/2012  PRIMARY CARE PHYSICIAN: Dr. Sullivan LoneGilbert  CHIEF COMPLAINT: Bee stings obtained and pain in the chest.   DISCHARGE DIAGNOSES:  1. Allergic reaction from bee stings.  2. Atypical chest pain, possibly from above with negative stress test.  3. Obesity.  4. Obstructive sleep apnea.  5. Hyperlipidemia.  6. Hypertension.   DISCHARGE MEDICATIONS:  1. Lexapro 20 mg once a day at bedtime. 2. Lisinopril 20 mg once a day at bedtime. 3. Diazepam 5 mg p.r.n. 1 tablet. 4. Lumigan 0.03% ophthalmic solution one GTT to each eye once a day at bedtime. 5. Prednisone 40 mg for one day, 30 mg for one day, 20 mg for one day, 10 mg one day then stop.  6. Simvastatin 10 mg bedtime. 7. Aspirin 81 mg daily.   DIET: Low sodium.   ACTIVITY: As tolerated.   FOLLOW UP: Please follow with your primary care physician within 1 to 2 weeks.   DISPOSITION: Home.   HISTORY OF PRESENT ILLNESS/HOSPITAL COURSE: For full details of the history and physical, please see the dictation by Dr. Winona LegatoVaickute on 02/20/2012, but briefly this is a 39 year old Caucasian male with obesity, hypertension, depression, anxiety, insomnia, glaucoma who presented with chest tightness, difficulty breathing after experiencing right forearm stings by bees which then became elevated, raised and reddened. The pain was cramping, intermittent, lasting approximately five hours with no alleviating or lessening factors radiating to left arm with left arm numbness. Patient was admitted to the hospitalist service. He was given steroids, Benadryl, famotidine and his localized reaction had completely resolved per history and physical by the time he was seen by the admitting doctor. For his chest pain, it was atypical and he was ruled out with cycled cardiac markers. He also underwent a stress test which was negative. He was  discharged on a short prednisone taper. His symptoms did resolve. He was discharged with the above medications.   TOTAL TIME SPENT: 35 minutes.   CODE STATUS: Patient is FULL CODE.   ____________________________ [name] [name] Eugen Jeansonne, MD sa:cms D: 03/23/2012 12:03:45 ET T: 03/23/2012 12:11:36 ET JOB#: [id number]  cc: [name] [name] Leylani Duley, MD, <Dictator> Richard L. Sullivan LoneGilbert, MD [name] [name] [name] Hukill MD ELECTRONICALLY SIGNED 03/23/2012 15:12

## 2014-10-07 NOTE — H&P (Signed)
PATIENT NAME:  [name] [name], Render E MR#:  [id number] DATE OF BIRTH:  [birth year]  DATE OF ADMISSION:  02/20/2012  PRIMARY CARE PHYSICIAN: Dr. Sullivan LoneGilbert   HISTORY OF PRESENT ILLNESS: The patient is a 39 year old Caucasian male with past medical history significant for history of obesity, history of hypertension, anxiety/depression, as well as insomnia and glaucoma who presented to the hospital to the Emergency Room today for difficulty breathing as well as chest tightness. According to the patient, he was out today and he got stung by a yellow jacket two or three times in his right forearm. His right forearm sting areas became somewhat elevated, raised, and reddened. He went into the house and took a few Benadryl. However, in the afternoon around 4 or 5 p.m. he started having left arm as well as left chest area, left armpit area discomfort and pain. Pain was described as cramping, intermittent, lasting approximately five hours with no alleviating or aggravating factors, radiating to the left arm with left arm numbness accompanied by shortness of breath as well as nausea. The patient denied any lightheadedness or dizziness or feeling presyncopal. He decided to come to the Emergency Room for further evaluation. In the Emergency Room, he was noted to have a few forearm reddened areas which were described by the Emergency Room physician as localized reaction. The patient was given steroids, Benadryl, famotidine, and his localized reaction completely resolved by the time I saw him at around 9 p.m.   PAST MEDICAL HISTORY:  1. History of hypertension. 2. Anxiety/depression. 3. Insomnia. 4. Glaucoma. 5. Obesity. 6. Obstructive sleep apnea.   MEDICATIONS:  1. Valium 5 mg p.o. at bedtime as needed. 2. Lexapro 20 mg p.o. daily.  3. Lisinopril 20 mg p.o. daily. 4. In the past he was on Lumigan 0.03% ophthalmic solution one drop to each eye at bedtime. He is not taking those medications anymore.  PAST SURGICAL  HISTORY: Low back surgeries in May 2013 as well as June 2013. He had a spinal canal Streptococcal infection and he was treated at [facility].   ALLERGIES: Penicillin gives him airway obstruction.   FAMILY HISTORY: Diabetes mellitus in the patient's mother. The patient's maternal grandmother had heart problems. The patient's father's side multiple family members had Alzheimer's disease.   SOCIAL HISTORY: The patient is married, has three boys, 41-year-old as well as 4912 year olds. Used to smoke in high school only, quit a long time ago. Occasional alcohol use, usually wine. 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   REVIEW OF SYSTEMS: Positive for pains in his left side of the chest, also closing throat, glaucoma, chest pains, some nausea, some shortness of breath with this episode, also intermittent back pains which are chronic. Denies any fevers, chills, fatigue, weakness, weight loss or gain. In regards to eyes, denies any blurry vision, double vision, or cataracts. ENT: Denies any tinnitus, allergies, epistaxis, sinus pain, dentures, or difficulty swallowing. RESPIRATORY: Denies any cough, wheezing, asthma, or COPD. CARDIOVASCULAR: Denies any orthopnea, edema, arrhythmias, palpitations, or syncope. GASTROINTESTINAL: Denies any vomiting, diarrhea, or constipation. GENITOURINARY: Denies dysuria, hematuria, frequency, or incontinence. ENDOCRINOLOGY: Denies any polydipsia, nocturia, thyroid problems, heat or cold intolerance, or thirst. HEMATOLOGIC: Denies any anemia, easy bruising, bleeding, or swollen glands. SKIN: Denies any acne, rashes, lesions, change in moles. MUSCULOSKELETAL: Denies arthritis, cramps, or swelling. NEUROLOGIC: No numbness, epilepsy, or tremor. PSYCHIATRY: Denies anxiety, insomnia, or depression.   PHYSICAL EXAMINATION:   VITAL SIGNS: On arrival to the hospital, temperature 98.1, pulse 108, respiration rate  20, blood pressure 143/92, saturation was 98% on room air.    GENERAL: This is a well developed, well nourished, obese Caucasian male in no significant distress laying on the stretcher.   HEENT: Pupils equal and reactive to light. Extraocular movements intact. No icterus or conjunctivitis. He is somewhat flushed. No icterus or conjunctivitis. Normal hearing. No pharyngeal erythema. Mucosa is moist.   NECK: Neck is thick. Thyroid is not enlarged. No adenopathy. No JVD or carotid bruits bilaterally. Full range of motion.   LUNGS: Clear to auscultation in all fields. No rales, rhonchi, diminished breath sounds, or wheezing. No labored inspirations, increased effort, dullness to percussion, or overt respiratory distress.   CARDIOVASCULAR: S1, S2 appreciated. No murmurs, gallops, or rubs noted. PMI not lateralized. Chest is nontender to palpation. 1+ pedal pulses. No lower extremity edema, calf tenderness, or cyanosis was noted.   ABDOMEN: Protuberant, soft, nontender. Bowel sounds are present. No hepatosplenomegaly or masses were noted.   RECTAL: Deferred.   MUSCULOSKELETAL: Muscle strength able to move all extremities. No cyanosis, degenerative joint disease, or kyphosis. Gait was not tested.   SKIN: Skin did not reveal any rashes or lesions. Mild erythema was noted in his right forearm where he is pressing on the railing here in the Emergency Room. Otherwise, no nodularity or induration. Skin was warm and dry to palpation. However, it was described that patient had some elevated reddened areas around the sting areas. Sting area, at least one, was seen on the right forearm during my evaluation.   LYMPH: No adenopathy in the cervical region.   NEUROLOGICAL: Cranial nerves grossly intact. Sensory is intact. No dysarthria or aphasia. The patient is alert, oriented to time, person, place, cooperative. Memory is good. No significant confusion, agitation, or depression noted.   LABORATORY, DIAGNOSTIC, AND RADIOLOGICAL DATA: EKG showed sinus rhythm with  occasional premature ventricular complexes, minimal voltage criteria for LVH, may be more normal variant, inferior infarct with Q waves in inferior leads were noted. No acute ST-T changes, however, were noted. No EKG to compare with.  Chest x-ray was not done.   Glucose 111, sodium 135, otherwise unremarkable BMP. The patient's cardiac enzymes CK total was slightly elevated at 273, otherwise unremarkable study. White blood cell count is elevated to 12.4, hemoglobin 14.8, platelet count 276.   ASSESSMENT AND PLAN:  1. Allergic reaction. Admit patient to medical floor. Continue him on steroid taper as well as give Benadryl and Zantac.  2. Chest pain, questionable if it is related to allergic reaction. Will admit patient to the medical floor to telemetry. Will start him on metoprolol, heparin sub-Q, aspirin, as well as nitroglycerin and will get stress test in the morning.  3. Hypertension. Will be holding ACE inhibitor as the patient will be on metoprolol as well as nitroglycerin while in the hospital.  4. Obesity. Will get hemoglobin A1c as well as TSH. The patient has known history of also obstructive sleep apnea so CPAP will be prescribed for him at night. Will also get lipid panel in the morning.  5. Obstructive sleep apnea. Order CPAP at night as previously discussed.   TIME SPENT: 50 minutes.   ____________________________ Katharina Caper, MD rv:drc D: 02/20/2012 22:06:24 ET T: 02/21/2012 06:48:52 ET JOB#: [id number]  cc: Katharina Caper, MD, <Dictator> Richard L. Sullivan Lone, MD Katharina Caper MD ELECTRONICALLY SIGNED 02/29/2012 22:11

## 2014-10-22 ENCOUNTER — Ambulatory Visit
Admission: RE | Admit: 2014-10-22 | Discharge: 2014-10-22 | Disposition: A | Discharge status: Home / Self Care | Payer: BLUE CROSS/BLUE SHIELD | Source: Ambulatory Visit | Attending: Radiology | Admitting: Radiology

## 2014-10-22 ENCOUNTER — Ambulatory Visit
Admission: RE | Admit: 2014-10-22 | Discharge: 2014-10-22 | Disposition: A | Discharge status: Home / Self Care | Payer: BLUE CROSS/BLUE SHIELD | Source: Ambulatory Visit | Attending: Nurse Practitioner | Admitting: Nurse Practitioner

## 2014-10-22 ENCOUNTER — Other Ambulatory Visit: Payer: Self-pay | Admitting: Nurse Practitioner

## 2014-10-22 DIAGNOSIS — R05 Cough: Secondary | ICD-10-CM

## 2014-10-22 DIAGNOSIS — R0602 Shortness of breath: Secondary | ICD-10-CM

## 2014-10-22 DIAGNOSIS — R059 Cough, unspecified: Secondary | ICD-10-CM

## 2014-12-17 ENCOUNTER — Telehealth: Payer: Self-pay | Admitting: Family Medicine

## 2014-12-17 MED ORDER — BUPROPION HCL ER (SR) 150 MG PO TB12: 150.0000 mg | ORAL_TABLET | Freq: Two times a day (BID) | ORAL | Status: DC

## 2014-12-17 NOTE — Telephone Encounter (Signed)
Patient advised, RX sent in for BID and pt will call back next week to make an appt-aa

## 2014-12-17 NOTE — Telephone Encounter (Signed)
Spoke with patient he states he has had a lot of stress and has felt some chest tightness with feeling anxious-no SOB, numbness/tingling, no cardiac symptoms. He feels lately more down, agitated and feeling like he just wants to cry. He states that in February Dr. Sullivan LoneGilbert mentioned to the patient that Bupropion may need to be increased from 150 mg but to follow. This is not mentioned in the note. Patient has been doing fine for a while but the last few weeks he has felt worse. He is taking Lexapro 20 mg, Bupropion ER 12 hr 150 mg, also takes Xanax 0.5 mg at bedtime but has not tried to take it during the day. His next appt with Dr. Sullivan LoneGilbert is in September for CPE. Please review. Thank you-aa

## 2014-12-17 NOTE — Telephone Encounter (Signed)
Pt called saying he was having some tightness in his chest but thinks it is anxiety.  He said Dr. Sullivan LoneGilbert told him last time he was in that he could double up on his medication and see if that helped when he was feeling that way.  Please call him back [phone].  tp

## 2014-12-17 NOTE — Telephone Encounter (Signed)
He can increase buproprion to 150mg  twice a day and schedule follow up with Dr. Sullivan LoneGilbert in the next week to 10 days. Patient was seen by cardiology in 2014 for chest tightness, if this is any different that the pain he had in 2014, then he should to ER if it occurs again.

## 2014-12-23 ENCOUNTER — Encounter: Payer: Self-pay | Admitting: *Deleted

## 2014-12-23 ENCOUNTER — Emergency Department: Payer: BLUE CROSS/BLUE SHIELD

## 2014-12-23 ENCOUNTER — Telehealth: Payer: Self-pay | Admitting: Family Medicine

## 2014-12-23 ENCOUNTER — Observation Stay
Admission: EM | Admit: 2014-12-23 | Discharge: 2014-12-24 | Disposition: A | Discharge status: Home / Self Care | Payer: BLUE CROSS/BLUE SHIELD | Attending: Internal Medicine | Admitting: Internal Medicine

## 2014-12-23 DIAGNOSIS — G4733 Obstructive sleep apnea (adult) (pediatric): Secondary | ICD-10-CM | POA: Diagnosis not present

## 2014-12-23 DIAGNOSIS — Z6841 Body Mass Index (BMI) 40.0 and over, adult: Secondary | ICD-10-CM | POA: Insufficient documentation

## 2014-12-23 DIAGNOSIS — Z87891 Personal history of nicotine dependence: Secondary | ICD-10-CM | POA: Diagnosis not present

## 2014-12-23 DIAGNOSIS — R079 Chest pain, unspecified: Secondary | ICD-10-CM | POA: Diagnosis present

## 2014-12-23 DIAGNOSIS — I200 Unstable angina: Secondary | ICD-10-CM

## 2014-12-23 DIAGNOSIS — Z79891 Long term (current) use of opiate analgesic: Secondary | ICD-10-CM | POA: Insufficient documentation

## 2014-12-23 DIAGNOSIS — F329 Major depressive disorder, single episode, unspecified: Secondary | ICD-10-CM | POA: Insufficient documentation

## 2014-12-23 DIAGNOSIS — R0602 Shortness of breath: Secondary | ICD-10-CM | POA: Insufficient documentation

## 2014-12-23 DIAGNOSIS — F419 Anxiety disorder, unspecified: Secondary | ICD-10-CM | POA: Diagnosis not present

## 2014-12-23 DIAGNOSIS — K219 Gastro-esophageal reflux disease without esophagitis: Secondary | ICD-10-CM | POA: Insufficient documentation

## 2014-12-23 DIAGNOSIS — F32A Depression, unspecified: Secondary | ICD-10-CM | POA: Insufficient documentation

## 2014-12-23 DIAGNOSIS — I10 Essential (primary) hypertension: Secondary | ICD-10-CM | POA: Insufficient documentation

## 2014-12-23 HISTORY — DX: Depression, unspecified: F32.A

## 2014-12-23 HISTORY — DX: Major depressive disorder, single episode, unspecified: F32.9

## 2014-12-23 LAB — CBC
HEMATOCRIT: 40.3 % (ref 40.0–52.0)
HEMATOCRIT: 39.1 % — AB (ref 40.0–52.0)
Hemoglobin: 13.5 g/dL (ref 13.0–18.0)
Hemoglobin: 13.1 g/dL (ref 13.0–18.0)
MCH: 31.9 pg (ref 26.0–34.0)
MCH: 31.6 pg (ref 26.0–34.0)
MCHC: 33.5 g/dL (ref 32.0–36.0)
MCHC: 33.3 g/dL (ref 32.0–36.0)
MCV: 95.1 fL (ref 80.0–100.0)
MCV: 94.7 fL (ref 80.0–100.0)
Platelets: 256 10*3/uL (ref 150–440)
Platelets: 223 10*3/uL (ref 150–440)
RBC: 4.23 MIL/uL — ABNORMAL LOW (ref 4.40–5.90)
RBC: 4.14 MIL/uL — ABNORMAL LOW (ref 4.40–5.90)
RDW: 14.1 % (ref 11.5–14.5)
RDW: 14 % (ref 11.5–14.5)
WBC: 7.6 10*3/uL (ref 3.8–10.6)
WBC: 8.6 10*3/uL (ref 3.8–10.6)

## 2014-12-23 LAB — BASIC METABOLIC PANEL
Anion gap: 8 (ref 5–15)
BUN: 15 mg/dL (ref 6–20)
CO2: 27 mmol/L (ref 22–32)
CREATININE: 0.96 mg/dL (ref 0.61–1.24)
Calcium: 9.1 mg/dL (ref 8.9–10.3)
Chloride: 103 mmol/L (ref 101–111)
GFR calc non Af Amer: >60 mL/min (ref >60)
Glucose, Bld: 114 mg/dL — ABNORMAL HIGH (ref 65–99)
POTASSIUM: 3.8 mmol/L (ref 3.5–5.1)
SODIUM: 138 mmol/L (ref 135–145)

## 2014-12-23 LAB — TROPONIN I: Troponin I: <0.03 ng/mL (ref <0.031)

## 2014-12-23 MED ORDER — LORAZEPAM 1 MG PO TABS
ORAL_TABLET | ORAL | Status: AC
  Administered 2014-12-23: 1 mg via ORAL
  Filled 2014-12-23: qty 1

## 2014-12-23 MED ORDER — ONDANSETRON HCL 4 MG/2ML IJ SOLN: 4.0000 mg | Freq: Four times a day (QID) | INTRAMUSCULAR | Status: DC | PRN

## 2014-12-23 MED ORDER — ACETAMINOPHEN 650 MG RE SUPP: 650.0000 mg | Freq: Four times a day (QID) | RECTAL | Status: DC | PRN

## 2014-12-23 MED ORDER — SODIUM CHLORIDE 0.9 % IJ SOLN
3.0000 mL | Freq: Two times a day (BID) | INTRAMUSCULAR | Status: DC
  Administered 2014-12-24 (×2): 3 mL via INTRAVENOUS

## 2014-12-23 MED ORDER — ASPIRIN EC 81 MG PO TBEC
DELAYED_RELEASE_TABLET | ORAL | Status: AC
Start: 2014-12-23 — End: 2014-12-23
  Administered 2014-12-23: 81 mg via ORAL
  Filled 2014-12-23: qty 1

## 2014-12-23 MED ORDER — ONDANSETRON HCL 4 MG PO TABS: 4.0000 mg | ORAL_TABLET | Freq: Four times a day (QID) | ORAL | Status: DC | PRN

## 2014-12-23 MED ORDER — ACETAMINOPHEN 325 MG PO TABS: 650.0000 mg | ORAL_TABLET | Freq: Four times a day (QID) | ORAL | Status: DC | PRN

## 2014-12-23 MED ORDER — LORAZEPAM 1 MG PO TABS
1.0000 mg | ORAL_TABLET | Freq: Once | ORAL | Status: AC
  Administered 2014-12-23: 1 mg via ORAL

## 2014-12-23 MED ORDER — MORPHINE SULFATE 2 MG/ML IJ SOLN: 2.0000 mg | INTRAMUSCULAR | Status: DC | PRN

## 2014-12-23 MED ORDER — HEPARIN SODIUM (PORCINE) 5000 UNIT/ML IJ SOLN
5000.0000 [IU] | Freq: Three times a day (TID) | INTRAMUSCULAR | Status: DC
  Administered 2014-12-24 (×2): 5000 [IU] via SUBCUTANEOUS
  Filled 2014-12-23 (×2): qty 1

## 2014-12-23 MED ORDER — ASPIRIN EC 81 MG PO TBEC
81.0000 mg | DELAYED_RELEASE_TABLET | Freq: Every day | ORAL | Status: DC
  Administered 2014-12-23 – 2014-12-24 (×2): 81 mg via ORAL
  Filled 2014-12-23: qty 1

## 2014-12-23 NOTE — H&P (Signed)
[facility] [place] [facility]   PATIENT NAME: [name] [name]    MR#:  [id number]  DATE OF BIRTH:  08-03-1975   DATE OF ADMISSION:  12/23/2014  PRIMARY CARE PHYSICIAN: [name], MD   REQUESTING/REFERRING PHYSICIAN: Dorothea Glassman  CHIEF COMPLAINT:   Chief Complaint  Patient presents with  . Chest Pain    HISTORY OF PRESENT ILLNESS:  [name] [name]  is a 39 y.o. male with a known history of essential hypertension, obstructive sleep apnea presenting with chest pain. 1 week duration intermittent chest pain however worsened over the last 1 day. Describes pain retrosternal in location nonradiating pressure/tightness in quality 7-8/10 intensity no identifiable worsening/relieving factors. Denies any associated nausea/shortness of breath/diaphoresis with above symptoms of present hospital further workup and evaluation.  PAST MEDICAL HISTORY:   Past Medical History  Diagnosis Date  . Hypertension   . GERD (gastroesophageal reflux disease)   . Depression   . Sleep apnea     cpap  . Headache(784.0)   . Depressed     PAST SURGICAL HISTORY:   Past Surgical History  Procedure Laterality Date  . Lumbar spine surgery  11/08/2011  . Lumbar disc surgery    . Lumbar wound debridement  11/23/2011    Procedure: LUMBAR WOUND DEBRIDEMENT;  Surgeon: Karn Cassis, MD;  Location: [facility];  Service: Neurosurgery;  Laterality: N/A;  Incision and drainage of lumbar wound  . Back surgery      SOCIAL HISTORY:   History  Substance Use Topics  . Smoking status: Former Smoker -- 0.30 packs/day for 3 years    Types: Cigarettes    Quit date: 08/09/1992  . Smokeless tobacco: Never Used  . Alcohol Use: Yes     Comment: occasional    FAMILY HISTORY:   Family History  Problem Relation Age of Onset  . Diabetes Mother   . CAD Other     DRUG ALLERGIES:   Allergies  Allergen Reactions  . Penicillins Anaphylaxis  . Shellfish Allergy  Anaphylaxis    REVIEW OF SYSTEMS:  REVIEW OF SYSTEMS:  CONSTITUTIONAL: Denies fevers, chills, fatigue, weakness.  EYES: Denies blurred vision, double vision, or eye pain.  EARS, NOSE, THROAT: Denies tinnitus, ear pain, hearing loss.  RESPIRATORY: denies cough, shortness of breath, wheezing  CARDIOVASCULAR: Positive chest pain, denies palpitations, edema.  GASTROINTESTINAL: Denies nausea, vomiting, diarrhea, abdominal pain.  GENITOURINARY: Denies dysuria, hematuria.  ENDOCRINE: Denies nocturia or thyroid problems. HEMATOLOGIC AND LYMPHATIC: Denies easy bruising or bleeding.  SKIN: Denies rash or lesions.  MUSCULOSKELETAL: Denies pain in neck, back, shoulder, knees, hips, or further arthritic symptoms.  NEUROLOGIC: Denies paralysis, paresthesias.  PSYCHIATRIC: Denies anxiety or depressive symptoms. Otherwise full review of systems performed by me is negative.   MEDICATIONS AT HOME:   Prior to Admission medications   Medication Sig Start Date End Date Taking? Authorizing Provider  acetaminophen (TYLENOL) 500 MG tablet Take 1,000 mg by mouth every 6 (six) hours as needed for mild pain or headache.    Yes Historical Provider, MD  buPROPion (WELLBUTRIN SR) 150 MG 12 hr tablet Take 1 tablet (150 mg total) by mouth 2 (two) times daily. 12/17/14  Yes Malva Limes, MD  diazepam (VALIUM) 10 MG tablet Take 10 mg by mouth every 6 (six) hours as needed for anxiety.    Yes Historical Provider, MD  escitalopram (LEXAPRO) 20 MG tablet Take 20 mg by mouth at bedtime.    Yes Historical Provider,  MD  lisinopril (PRINIVIL,ZESTRIL) 40 MG tablet Take 40 mg by mouth at bedtime.    Yes Historical Provider, MD  oxyCODONE (ROXICODONE) 15 MG immediate release tablet Take 15 mg by mouth every 4 (four) hours as needed for pain.    Yes Historical Provider, MD      VITAL SIGNS:  Blood pressure 100/65, pulse 73, temperature 98.2 F (36.8 C), temperature source Oral, resp. rate 20, height 6\' 1"  (1.854 m), weight  300 lb (136.079 kg), SpO2 99 %.  PHYSICAL EXAMINATION:  VITAL SIGNS: Filed Vitals:   12/23/14 2107  BP: 100/65  Pulse: 73  Temp:   Resp: 20   GENERAL:39 y.o.male currently in no acute distress.  HEAD: Normocephalic, atraumatic.  EYES: Pupils equal, round, reactive to light. Extraocular muscles intact. No scleral icterus.  MOUTH: Moist mucosal membrane. Dentition intact. No abscess noted.  EAR, NOSE, THROAT: Clear without exudates. No external lesions.  NECK: Supple. No thyromegaly. No nodules. No JVD.  PULMONARY: Clear to ascultation, without wheeze rails or rhonci. No use of accessory muscles, Good respiratory effort. good air entry bilaterally CHEST: Nontender to palpation.  CARDIOVASCULAR: S1 and S2. Regular rate and rhythm. No murmurs, rubs, or gallops. No edema. Pedal pulses 2+ bilaterally.  GASTROINTESTINAL: Soft, nontender, nondistended. No masses. Positive bowel sounds. No hepatosplenomegaly.  MUSCULOSKELETAL: No swelling, clubbing, or edema. Range of motion full in all extremities.  NEUROLOGIC: Cranial nerves II through XII are intact. No gross focal neurological deficits. Sensation intact. Reflexes intact.  SKIN: No ulceration, lesions, rashes, or cyanosis. Skin warm and dry. Turgor intact.  PSYCHIATRIC: Mood, affect within normal limits. The patient is awake, alert and oriented x 3. Insight, judgment intact.    LABORATORY PANEL:   CBC  Recent Labs Lab 12/23/14 1632  WBC 7.6  HGB 13.5  HCT 40.3  PLT 256   ------------------------------------------------------------------------------------------------------------------  Chemistries   Recent Labs Lab 12/23/14 1632  NA 138  K 3.8  CL 103  CO2 27  GLUCOSE 114*  BUN 15  CREATININE 0.96  CALCIUM 9.1   ------------------------------------------------------------------------------------------------------------------  Cardiac Enzymes  Recent Labs Lab 12/23/14 1632  TROPONINI <0.03    ------------------------------------------------------------------------------------------------------------------  RADIOLOGY:  Dg Chest Portable 1 View  12/23/2014   CLINICAL DATA:  Chest pain and heaviness.  EXAM: PORTABLE CHEST - 1 VIEW  COMPARISON:  10/22/2014  FINDINGS: Linear subsegmental atelectasis or scar along the right hemidiaphragm. The lungs appear otherwise clear. Cardiac and mediastinal margins appear normal. No pleural effusion.  IMPRESSION: 1. Subsegmental atelectasis or scarring at the right lung base. Otherwise, no significant abnormalities are observed.   Electronically Signed   By: Gaylyn RongWalter  Liebkemann M.D.   On: 12/23/2014 18:36    EKG:   Orders placed or performed during the hospital encounter of 12/23/14  . ED EKG (<5810mins upon arrival to the ED)  . ED EKG (<3110mins upon arrival to the ED)    IMPRESSION AND PLAN:   39 year old Caucasian gentleman history of essential hypertension presenting with chest pain.  1. Chest pain, central: Initiate aspirin and statin therapy, admitted to telemetry, trend cardiac enzymes 3,  if continued elevation will initiate heparin drip ,nitroglycerin when necessary, morphine when necessary, will order stress test 2. Essential hypertension: Lisinopril 3. Anxiety/depression not otherwise specified: Wellbutrin 4. Venous thrombolic embolism prophylactic: Heparin subcutaneous      All the records are reviewed and case discussed with ED provider. Management plans discussed with the patient, family and they are in agreement.  CODE STATUS: Full  TOTAL TIME TAKING CARE OF THIS PATIENT: 35 minutes.    Deira Shimer,  Mardi Mainland.D on 12/23/2014 at 9:54 PM  Between 7am to 6pm - Pager - [phone]  After 6pm: House Pager: - [phone]  [facility]  Office  [phone]  CC: Primary care physician; [name], MD

## 2014-12-23 NOTE — ED Notes (Signed)
Past few days c/o periods of chest pain  Heaviness,

## 2014-12-23 NOTE — ED Notes (Signed)
Pt states he feels anxious, Dr Darnelle CatalanMalinda made aware, order obtained.

## 2014-12-23 NOTE — ED Provider Notes (Signed)
[facility] Emergency Department Provider Note  ____________________________________________  Time seen: Approximately 6:38 PM  I have reviewed the triage vital signs and the nursing notes.   HISTORY  Chief Complaint Chest Pain  HPI [name] Innocent. is a 39 y.o. male patient reports he's been having chest pain intermittently since last Wednesday to see his family practice doctor who some of his medicines as helped little bit but then yesterday and last night it became worse as a heaviness in his mid chest leads to the neck he has a little bit of shortness of breath with it there is no nausea or sweating pain is not brought on by X exercises near as he can tell is not brought on by anything in particular it is not relieved by anything in particular reports his mother had heart disease beginning in her 28s his grandmother had heart disease beginning in her 58s and his grandfather had heart disease beginning in his 47s rash should go under family history the pain is increasing in severity and frequency and is lasting longer over the last few days  Past Medical History  Diagnosis Date  . Hypertension   . GERD (gastroesophageal reflux disease)   . Depression   . Sleep apnea     cpap  . Headache(784.0)   . Depressed     Patient Active Problem List   Diagnosis Date Noted  . Chest pain, central 12/23/2014    Past Surgical History  Procedure Laterality Date  . Lumbar spine surgery  11/08/2011  . Lumbar disc surgery    . Lumbar wound debridement  11/23/2011    Procedure: LUMBAR WOUND DEBRIDEMENT;  Surgeon: Karn Cassis, MD;  Location: [facility];  Service: Neurosurgery;  Laterality: N/A;  Incision and drainage of lumbar wound  . Back surgery      Current Outpatient Rx  Name  Route  Sig  Dispense  Refill  . acetaminophen (TYLENOL) 500 MG tablet   Oral   Take 1,000 mg by mouth every 6 (six) hours as needed for mild pain or headache.          Marland Kitchen  buPROPion (WELLBUTRIN SR) 150 MG 12 hr tablet   Oral   Take 1 tablet (150 mg total) by mouth 2 (two) times daily.   60 tablet   3     Changing dose to twice daily instead of 1 daily   . diazepam (VALIUM) 10 MG tablet   Oral   Take 10 mg by mouth every 6 (six) hours as needed for anxiety.          Marland Kitchen escitalopram (LEXAPRO) 20 MG tablet   Oral   Take 20 mg by mouth at bedtime.          Marland Kitchen lisinopril (PRINIVIL,ZESTRIL) 40 MG tablet   Oral   Take 40 mg by mouth at bedtime.          Marland Kitchen oxyCODONE (ROXICODONE) 15 MG immediate release tablet   Oral   Take 15 mg by mouth every 4 (four) hours as needed for pain.            Allergies Penicillins and Shellfish allergy  Family History  Problem Relation Age of Onset  . Diabetes Mother   . CAD Other     Social History History  Substance Use Topics  . Smoking status: Former Smoker -- 0.30 packs/day for 3 years    Types: Cigarettes    Quit date: 08/09/1992  .  Smokeless tobacco: Never Used  . Alcohol Use: Yes     Comment: occasional    Review of Systems Constitutional: No fever/chills Eyes: No visual changes. ENT: No sore throat. Cardiovascular: See history of present illness Respiratory: See history of present illness Gastrointestinal: No abdominal pain.  No nausea, no vomiting.  No diarrhea.  No constipation. Genitourinary: Negative for dysuria. Musculoskeletal: Negative for back pain. Skin: Negative for rash. Neurological: Negative for headaches, focal weakness or numbness.  10-point ROS otherwise negative.  ____________________________________________   PHYSICAL EXAM:  VITAL SIGNS: ED Triage Vitals  Enc Vitals Group     BP 12/23/14 1637 121/60 mmHg     Pulse Rate 12/23/14 1637 78     Resp 12/23/14 1637 20     Temp 12/23/14 1637 98.2 F (36.8 C)     Temp Source 12/23/14 1637 Oral     SpO2 12/23/14 1637 100 %     Weight 12/23/14 1637 300 lb (136.079 kg)     Height 12/23/14 1637 6\' 1"  (1.854 m)      Head Cir --      Peak Flow --      Pain Score 12/23/14 1634 5     Pain Loc --      Pain Edu? --      Excl. in GC? --     Constitutional: Alert and oriented. Well appearing and in no acute distress. Eyes: Conjunctivae are normal. PERRL. EOMI. Head: Atraumatic. Nose: No congestion/rhinnorhea. Mouth/Throat: Mucous membranes are moist.  Oropharynx non-erythematous. Neck: No stridoR Cardiovascular: Normal rate, regular rhythm. Grossly normal heart sounds.  Good peripheral circulation. Respiratory: Normal respiratory effort.  No retractions. Lungs CTAB. Gastrointestinal: Soft and nontender. No distention. No abdominal bruits. No CVA tenderness. Musculoskeletal: No lower extremity tenderness nor edema.  No joint effusions. Neurologic:  Normal speech and language. No gross focal neurologic deficits are appreciated. Speech is normal. No gait instability. Skin:  Skin is warm, dry and intact. No rash noted. Psychiatric: Mood and affect are normal. Speech and behavior are normal.  ____________________________________________   LABS (all labs ordered are listed, but only abnormal results are displayed)  Labs Reviewed  CBC - Abnormal; Notable for the following:    RBC 4.23 (*)    All other components within normal limits  BASIC METABOLIC PANEL - Abnormal; Notable for the following:    Glucose, Bld 114 (*)    All other components within normal limits  TROPONIN I  CBC  CREATININE, SERUM   ____________________________________________  EKG  EKG read and interpreted by me shows normal sinus rhythm at a rate of 75 normal axis is a large Q in lead 3 and a very small 1 and lead F but it's approximately one third the size of a complete QR S complex ____________________________________________  RADIOLOGY  Chest x-ray shows no acute disease per radiology there is some atelectasis or scarring in the bases  though ____________________________________________   PROCEDURES    ____________________________________________   INITIAL IMPRESSION / ASSESSMENT AND PLAN / ED COURSE  Pertinent labs & imaging results that were available during my care of the patient were reviewed by me and considered in my medical decision making (see chart for details).   ____________________________________________   FINAL CLINICAL IMPRESSION(S) / ED DIAGNOSES  Final diagnoses:  Unstable angina      [name] NatalPaul F Malinda, MD 12/23/14 2154

## 2014-12-24 ENCOUNTER — Telehealth: Payer: Self-pay | Admitting: Family Medicine

## 2014-12-24 LAB — LIPID PANEL
CHOLESTEROL: 201 mg/dL — AB (ref 0–200)
HDL: 41 mg/dL (ref >40)
LDL Cholesterol: 110 mg/dL — ABNORMAL HIGH (ref 0–99)
Total CHOL/HDL Ratio: 4.9 RATIO
Triglycerides: 250 mg/dL — ABNORMAL HIGH (ref <150)
VLDL: 50 mg/dL — ABNORMAL HIGH (ref 0–40)

## 2014-12-24 LAB — TROPONIN I: Troponin I: <0.03 ng/mL (ref <0.031)

## 2014-12-24 LAB — CREATININE, SERUM
Creatinine, Ser: 0.94 mg/dL (ref 0.61–1.24)
GFR calc Af Amer: >60 mL/min (ref >60)
GFR calc non Af Amer: >60 mL/min (ref >60)

## 2014-12-24 MED ORDER — OXYCODONE HCL 5 MG PO TABS: 15.0000 mg | ORAL_TABLET | ORAL | Status: DC | PRN

## 2014-12-24 MED ORDER — BUPROPION HCL ER (SR) 150 MG PO TB12
150.0000 mg | ORAL_TABLET | Freq: Two times a day (BID) | ORAL | Status: DC
  Administered 2014-12-24 (×2): 150 mg via ORAL
  Filled 2014-12-24 (×2): qty 1

## 2014-12-24 MED ORDER — LISINOPRIL 20 MG PO TABS
40.0000 mg | ORAL_TABLET | Freq: Every day | ORAL | Status: DC
  Administered 2014-12-24: 40 mg via ORAL
  Filled 2014-12-24: qty 2

## 2014-12-24 MED ORDER — ENOXAPARIN SODIUM 40 MG/0.4ML ~~LOC~~ SOLN: 40.0000 mg | Freq: Two times a day (BID) | SUBCUTANEOUS | Status: DC

## 2014-12-24 MED ORDER — ASPIRIN 81 MG PO TBEC
81.0000 mg | DELAYED_RELEASE_TABLET | Freq: Every day | ORAL | Status: DC
Start: 2014-12-24 — End: 2017-04-17

## 2014-12-24 MED ORDER — NITROGLYCERIN 0.4 MG SL SUBL
0.4000 mg | SUBLINGUAL_TABLET | SUBLINGUAL | Status: DC | PRN
Start: 2014-12-24 — End: 2014-12-24

## 2014-12-24 MED ORDER — ESCITALOPRAM OXALATE 10 MG PO TABS
20.0000 mg | ORAL_TABLET | Freq: Every day | ORAL | Status: DC
  Administered 2014-12-24: 20 mg via ORAL
  Filled 2014-12-24: qty 2

## 2014-12-24 MED ORDER — DIAZEPAM 5 MG PO TABS: 10.0000 mg | ORAL_TABLET | Freq: Four times a day (QID) | ORAL | Status: DC | PRN

## 2014-12-24 NOTE — Plan of Care (Signed)
Problem: Discharge Progression Outcomes Goal: Hemodynamically stable Outcome: Progressing Pt is alert and oriented x 4, c/o chest pressure but denies pain, up in room independently, wife at bedside, vital signs stable, good appetite, exercise test negative, pt is to f/u with pcp as schedulded, no new prescriptions, uneventful shift. D/c to home, pushed to visitor entrance via volunteer staff.

## 2014-12-24 NOTE — Progress Notes (Signed)
[facility]         [place], [place]   12/24/2014  Patient: [name] CapersRaymond [name]   Date of Birth:  Nov 29, 1975  Date of admission:  12/23/2014  Date of Discharge  12/24/2014    To Whom it May Concern:   [name] CapersRaymond [name]  may return to work on 12/25/2014.  WORK-EMPLOYMENT:  No Restrictions   If you have any questions or concerns, please don't hesitate to call.  Sincerely,   Elby ShowersWALSH, CATHERINE M.D Office : [phone]   .

## 2014-12-24 NOTE — Progress Notes (Signed)
Notified Dr. Allena KatzPatel via person that Dr Cassie FreerParachos reports that stress test was negative via telephone.

## 2014-12-24 NOTE — Telephone Encounter (Signed)
Pt is scheduled for a hospital F/U for Monday 01/05/15. Pt is being discharged from [facility] today 12/24/14 for chest pain. I made the appt for a 30 minute slot. Thanks TNP

## 2014-12-24 NOTE — Discharge Summary (Signed)
[facility] [place] [facility]   PATIENT NAME: [name] [name]    MR#:  [id number]  DATE OF BIRTH:  Apr 17, 1976  DATE OF ADMISSION:  12/23/2014 ADMITTING PHYSICIAN: Wyatt Haste, MD  DATE OF DISCHARGE: 12/24/14  PRIMARY CARE PHYSICIAN: [name], MD    ADMISSION DIAGNOSIS:  Unstable angina [I20.0]  DISCHARGE DIAGNOSIS:  Chest pain with negative stress test  HTN ,orbid obesity  SECONDARY DIAGNOSIS:   Past Medical History  Diagnosis Date  . Hypertension   . GERD (gastroesophageal reflux disease)   . Depression   . Sleep apnea     cpap  . Headache(784.0)   . Depressed     HOSPITAL COURSE:   39 year old Caucasian gentleman history of essential hypertension presenting with chest pain.  1. Chest pain, central:  -on aspirin and statin therapy -negative cardiac enzymes 3 -pt underwent stress test -exercise and per Dr Cassie Freer negative for ischemia -pt advised to f/u PCP if symptoms cont and get out pt cardiology referrral  2. Essential hypertension: Lisinopril  3. Anxiety/depression not otherwise specified: Wellbutrin  4. Venous thrombolic embolism prophylactic: Heparin subcutaneous  D/w family in the room Hemodynamically stable Will d/c pt home later today  DISCHARGE CONDITIONS:  fair CONSULTS OBTAINED:  Treatment Team:  Marcina Millard, MD  DRUG ALLERGIES:   Allergies  Allergen Reactions  . Penicillins Anaphylaxis  . Shellfish Allergy Anaphylaxis    DISCHARGE MEDICATIONS:   Current Discharge Medication List    START taking these medications   Details  aspirin EC 81 MG EC tablet Take 1 tablet (81 mg total) by mouth daily. Qty: 30 tablet, Refills: 0      CONTINUE these medications which have NOT CHANGED   Details  acetaminophen (TYLENOL) 500 MG tablet Take 1,000 mg by mouth every 6 (six) hours as needed for mild pain or headache.     buPROPion (WELLBUTRIN SR) 150 MG 12 hr tablet Take 1 tablet (150 mg  total) by mouth 2 (two) times daily. Qty: 60 tablet, Refills: 3    diazepam (VALIUM) 10 MG tablet Take 10 mg by mouth every 6 (six) hours as needed for anxiety.     escitalopram (LEXAPRO) 20 MG tablet Take 20 mg by mouth at bedtime.     lisinopril (PRINIVIL,ZESTRIL) 40 MG tablet Take 40 mg by mouth at bedtime.     oxyCODONE (ROXICODONE) 15 MG immediate release tablet Take 15 mg by mouth every 4 (four) hours as needed for pain.         If you experience worsening of your admission symptoms, develop shortness of breath, life threatening emergency, suicidal or homicidal thoughts you must seek medical attention immediately by calling 911 or calling your MD immediately  if symptoms less severe.  You Must read complete instructions/literature along with all the possible adverse reactions/side effects for all the Medicines you take and that have been prescribed to you. Take any new Medicines after you have completely understood and accept all the possible adverse reactions/side effects.   Please note  You were cared for by a hospitalist during your hospital stay. If you have any questions about your discharge medications or the care you received while you were in the hospital after you are discharged, you can call the unit and asked to speak with the hospitalist on call if the hospitalist that took care of you is not available. Once you are discharged, your primary care physician will handle any further medical issues. Please note  that NO REFILLS for any discharge medications will be authorized once you are discharged, as it is imperative that you return to your primary care physician (or establish a relationship with a primary care physician if you do not have one) for your aftercare needs so that they can reassess your need for medications and monitor your lab values. Today   SUBJECTIVE   Just got back from stress test. No cp at present  VITAL SIGNS:  Blood pressure 108/76, pulse 61,  temperature 98.2 F (36.8 C), temperature source Oral, resp. rate 17, height  (1.854 m), weight 149.324 kg (329 lb 3.2 oz), SpO2 99 %.  I/O:   Intake/Output Summary (Last 24 hours) at 12/24/14 1346 Last data filed at 12/24/14 1210  Gross per 24 hour  Intake    240 ml  Output   1250 ml  Net  -1010 ml    PHYSICAL EXAMINATION:  GENERAL:  39 y.o.-year-old patient lying in the bed with no acute distress. Morbid obeisty EYES: Pupils equal, round, reactive to light and accommodation. No scleral icterus. Extraocular muscles intact.  HEENT: Head atraumatic, normocephalic. Oropharynx and nasopharynx clear.  NECK:  Supple, no jugular venous distention. No thyroid enlargement, no tenderness.  LUNGS: Normal breath sounds bilaterally, no wheezing, rales,rhonchi or crepitation. No use of accessory muscles of respiration.  CARDIOVASCULAR: S1, S2 normal. No murmurs, rubs, or gallops.  ABDOMEN: Soft, non-tender, non-distended. Bowel sounds present. No organomegaly or mass.  EXTREMITIES: No pedal edema, cyanosis, or clubbing.  NEUROLOGIC: Cranial nerves II through XII are intact. Muscle strength 5/5 in all extremities. Sensation intact. Gait not checked.  PSYCHIATRIC: The patient is alert and oriented x 3.  SKIN: No obvious rash, lesion, or ulcer.   DATA REVIEW:   CBC   Recent Labs Lab 12/23/14 2318  WBC 8.6  HGB 13.1  HCT 39.1*  PLT 223    Chemistries   Recent Labs Lab 12/23/14 1632 12/23/14 2318  NA 138  --   K 3.8  --   CL 103  --   CO2 27  --   GLUCOSE 114*  --   BUN 15  --   CREATININE 0.96 0.94  CALCIUM 9.1  --     Microbiology Results   No results found for this or any previous visit (from the past 240 hour(s)).  RADIOLOGY:  Dg Chest Portable 1 View  12/23/2014   CLINICAL DATA:  Chest pain and heaviness.  EXAM: PORTABLE CHEST - 1 VIEW  COMPARISON:  10/22/2014  FINDINGS: Linear subsegmental atelectasis or scar along the right hemidiaphragm. The lungs appear  otherwise clear. Cardiac and mediastinal margins appear normal. No pleural effusion.  IMPRESSION: 1. Subsegmental atelectasis or scarring at the right lung base. Otherwise, no significant abnormalities are observed.   Electronically Signed   By: Gaylyn Rong M.D.   On: 12/23/2014 18:36     Management plans discussed with the patient, family and they are in agreement.  CODE STATUS:     Code Status Orders        Start     Ordered   12/23/14 2135  Full code   Continuous     12/23/14 2134      TOTAL TIME TAKING CARE OF THIS PATIENT: 40 minutes.    Thaison Kolodziejski M.D on 12/24/2014 at 1:46 PM  Between 7am to 6pm - Pager - [phone] After 6pm go to [email or web address] - password [email or web address] [facility]  [facility]  Office  [phone]  CC: Primary  care physician; Megan Mansichard Gilbert Jr, MD

## 2014-12-24 NOTE — Progress Notes (Signed)
Pt stated he did not want to wear cpap tonight. RN aware.

## 2014-12-27 DIAGNOSIS — K219 Gastro-esophageal reflux disease without esophagitis: Secondary | ICD-10-CM | POA: Insufficient documentation

## 2014-12-27 DIAGNOSIS — F320 Major depressive disorder, single episode, mild: Secondary | ICD-10-CM | POA: Insufficient documentation

## 2014-12-27 DIAGNOSIS — G4730 Sleep apnea, unspecified: Secondary | ICD-10-CM | POA: Insufficient documentation

## 2014-12-27 DIAGNOSIS — E291 Testicular hypofunction: Secondary | ICD-10-CM | POA: Insufficient documentation

## 2014-12-27 DIAGNOSIS — J309 Allergic rhinitis, unspecified: Secondary | ICD-10-CM | POA: Insufficient documentation

## 2014-12-27 DIAGNOSIS — E559 Vitamin D deficiency, unspecified: Secondary | ICD-10-CM | POA: Insufficient documentation

## 2014-12-27 DIAGNOSIS — F32A Depression, unspecified: Secondary | ICD-10-CM | POA: Insufficient documentation

## 2014-12-27 DIAGNOSIS — E661 Drug-induced obesity: Secondary | ICD-10-CM | POA: Insufficient documentation

## 2014-12-27 DIAGNOSIS — F419 Anxiety disorder, unspecified: Secondary | ICD-10-CM

## 2014-12-27 DIAGNOSIS — F329 Major depressive disorder, single episode, unspecified: Secondary | ICD-10-CM | POA: Insufficient documentation

## 2014-12-27 DIAGNOSIS — I10 Essential (primary) hypertension: Secondary | ICD-10-CM | POA: Insufficient documentation

## 2015-01-05 ENCOUNTER — Encounter: Payer: Self-pay | Admitting: Family Medicine

## 2015-01-05 ENCOUNTER — Ambulatory Visit (INDEPENDENT_AMBULATORY_CARE_PROVIDER_SITE_OTHER): Payer: BLUE CROSS/BLUE SHIELD | Admitting: Family Medicine

## 2015-01-05 VITALS — BP 112/64 | HR 86 | Temp 98.7°F | Resp 12 | Wt 302.0 lb

## 2015-01-05 DIAGNOSIS — G4700 Insomnia, unspecified: Secondary | ICD-10-CM

## 2015-01-05 DIAGNOSIS — R079 Chest pain, unspecified: Secondary | ICD-10-CM | POA: Diagnosis not present

## 2015-01-05 DIAGNOSIS — I10 Essential (primary) hypertension: Secondary | ICD-10-CM

## 2015-01-05 DIAGNOSIS — F329 Major depressive disorder, single episode, unspecified: Secondary | ICD-10-CM

## 2015-01-05 DIAGNOSIS — F32A Depression, unspecified: Secondary | ICD-10-CM

## 2015-01-05 DIAGNOSIS — F419 Anxiety disorder, unspecified: Secondary | ICD-10-CM

## 2015-01-05 DIAGNOSIS — F418 Other specified anxiety disorders: Secondary | ICD-10-CM | POA: Diagnosis not present

## 2015-01-05 DIAGNOSIS — G4730 Sleep apnea, unspecified: Secondary | ICD-10-CM | POA: Diagnosis not present

## 2015-01-05 MED ORDER — LORAZEPAM 1 MG PO TABS: ORAL_TABLET | ORAL | Status: DC

## 2015-01-05 NOTE — Progress Notes (Signed)
Patient ID: [name] [name]., male   DOB: 29-Oct-1975, 39 y.o.   MRN: [id number]    Subjective:  HPI  Patient is here for hospital follow up. He was admitted on July 5th for chest pain and released on July 6th.  Diagnoses:  Unstable angina.    Chest pain with negative stress test, negative cardiac enzymes x 3.   Hypertension   Morbid Obesity.  Labs checked: Troponin, Lipid, CBC, MetB, Creat.  Patient states he thinks this is related to anxiety, he has had chest pains for about a week or so prior to going to the hospital. About a week before he went to the hospital his Wellbutrin 150 mg was increased to 2 tablets daily by Dr. Sherrie Mustache and patient did not really noticed a difference with that. He is also taking Lexapro daily and Valium at bedtime. On his list of medications we have him on Xanax but he does not think he is taking that. He states it is really easy for him to get agitated. He is still gets chest pain off and on-heavy sensation. HE also states he has not been able to sleep well-"his mind will not shut off."  Prior to Admission medications   Medication Sig Start Date End Date Taking? Authorizing Provider  acetaminophen (TYLENOL) 500 MG tablet Take 1,000 mg by mouth every 6 (six) hours as needed for mild pain or headache.     Historical Provider, MD  ALPRAZolam (XANAX XR) 0.5 MG 24 hr tablet Take by mouth. 07/23/14   Historical Provider, MD  aspirin EC 81 MG EC tablet Take 1 tablet (81 mg total) by mouth daily. 12/24/14   Enedina Finner, MD  bimatoprost (LUMIGAN) 0.01 % SOLN Apply to eye. 03/05/12   Historical Provider, MD  buPROPion (WELLBUTRIN SR) 150 MG 12 hr tablet Take 1 tablet (150 mg total) by mouth 2 (two) times daily. 12/17/14   Malva Limes, MD  diazepam (VALIUM) 10 MG tablet Take 10 mg by mouth every 6 (six) hours as needed for anxiety.     Historical Provider, MD  EPINEPHRINE, ANAPHYLAXIS THERAPY AGENTS, EPIPEN 2-PAK, 0.3MG /0.3ML (Injection Device)  1 Device as directed for 0  days  Quantity: 1;  Refills: 12   Ordered :05-Mar-2012  Janey Greaser ;  Started 05-Mar-2012 Active 03/05/12   Historical Provider, MD  escitalopram (LEXAPRO) 20 MG tablet Take 20 mg by mouth at bedtime.     Historical Provider, MD  escitalopram (LEXAPRO) 20 MG tablet Take by mouth. 01/30/14   Historical Provider, MD  lisinopril (PRINIVIL,ZESTRIL) 40 MG tablet Take 40 mg by mouth at bedtime.     Historical Provider, MD  omeprazole (PRILOSEC) 20 MG capsule Take by mouth. 01/21/13   Historical Provider, MD  oxyCODONE (ROXICODONE) 15 MG immediate release tablet Take 15 mg by mouth every 4 (four) hours as needed for pain.     Historical Provider, MD  topiramate (TOPAMAX) 50 MG tablet Take by mouth.    Historical Provider, MD  traMADol (ULTRAM) 50 MG tablet Take by mouth.    Historical Provider, MD    Patient Active Problem List   Diagnosis Date Noted  . Anxiety and depression 12/27/2014  . Allergic rhinitis 12/27/2014  . Depression, major, single episode, mild 12/27/2014  . Essential (primary) hypertension 12/27/2014  . Esophageal reflux 12/27/2014  . 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 Apnea, sleep 12/27/2014  . Avitaminosis D 12/27/2014  . Chest pain, central 12/23/2014    Past  Medical History  Diagnosis Date  . Hypertension   . GERD (gastroesophageal reflux disease)   . Depression   . Sleep apnea     cpap  . Headache(784.0)   . Depressed     History   Social History  . Marital Status: Married    Spouse Name: N/A  . Number of Children: N/A  . Years of Education: N/A   Occupational History  . Not on file.   Social History Main Topics  . Smoking status: Former Smoker -- 0.30 packs/day for 3 years    Types: Cigarettes    Quit date: 08/09/1992  . Smokeless tobacco: Never Used  . Alcohol Use: Yes     Comment: occasional  . Drug Use: No  . Sexual Activity: Yes   Other Topics Concern  . Not on file   Social History Narrative     Allergies  Allergen Reactions  . Penicillins Anaphylaxis  . Shellfish Allergy Anaphylaxis  . Yellow Jacket Venom Shortness Of Breath    Review of Systems  Constitutional: Positive for malaise/fatigue. Negative for fever and chills.  Respiratory: Positive for shortness of breath (sometimes not always related with chest pain episodes).   Cardiovascular: Positive for chest pain. Negative for palpitations and leg swelling.  Gastrointestinal: Negative for heartburn, nausea, vomiting, abdominal pain and diarrhea.  Musculoskeletal: Positive for back pain. Negative for joint pain and neck pain.  Neurological: Positive for headaches. Negative for dizziness and weakness.  Psychiatric/Behavioral: The patient has insomnia. The patient is not nervous/anxious.     Immunization History  Administered Date(s) Administered  . Tdap 07/03/2012   Objective:  BP 112/64 mmHg  Pulse 86  Temp(Src) 98.7 F (37.1 C)  Resp 12  Wt 302 lb (136.986 kg) O2 96%  Physical Exam  Constitutional: He is oriented to person, place, and time and well-developed, well-nourished, and in no distress.  HENT:  Head: Normocephalic and atraumatic.  Right Ear: External ear normal.  Left Ear: External ear normal.  Nose: Nose normal.  Eyes: Conjunctivae are normal. Pupils are equal, round, and reactive to light.  Neck: Normal range of motion. Neck supple.  Cardiovascular: Normal rate, regular rhythm, normal heart sounds and intact distal pulses.   No murmur heard. Pulmonary/Chest: Effort normal and breath sounds normal. He has no wheezes. He has no rales.  Abdominal: Soft. Bowel sounds are normal.  Musculoskeletal: Normal range of motion. He exhibits no edema or tenderness.  Neurological: He is alert and oriented to person, place, and time. Gait normal.  Skin: Skin is warm and dry.  Psychiatric: Mood, memory, affect and judgment normal.    Lab Results  Component Value Date   WBC 8.6 12/23/2014   HGB 13.1  12/23/2014   HCT 39.1* 12/23/2014   PLT 223 12/23/2014   GLUCOSE 114* 12/23/2014   CHOL 201* 12/24/2014   TRIG 250* 12/24/2014   HDL 41 12/24/2014   LDLCALC 110* 12/24/2014   TSH 2.11 07/02/2012   INR 1.07 11/23/2011    CMP     Component Value Date/Time   NA 138 12/23/2014 1632   NA 139 07/02/2012   NA 134* 02/21/2012 0403   K 3.8 12/23/2014 1632   K 4.2 02/21/2012 0403   CL 103 12/23/2014 1632   CL 103 02/21/2012 0403   CO2 27 12/23/2014 1632   CO2 24 02/21/2012 0403   GLUCOSE 114* 12/23/2014 1632   GLUCOSE 230* 02/21/2012 0403   BUN 15 12/23/2014 1632   BUN 16 07/02/2012  BUN 19* 02/21/2012 0403   CREATININE 0.94 12/23/2014 2318   CREATININE 0.9 07/02/2012   CREATININE 1.26 02/21/2012 0403   CALCIUM 9.1 12/23/2014 1632   CALCIUM 9.3 02/21/2012 0403   PROT 7.9 02/21/2012 0403   ALBUMIN 3.8 02/21/2012 0403   AST 23 07/02/2012   AST 33 02/21/2012 0403   ALT 46* 07/02/2012   ALT 71 02/21/2012 0403   ALKPHOS 75 07/02/2012   ALKPHOS 92 02/21/2012 0403   GFRNONAA >60 12/23/2014 2318   GFRNONAA >60 02/21/2012 0403   GFRAA >60 12/23/2014 2318   GFRAA >60 02/21/2012 0403    Assessment and Plan :  1. Chest pain, central Off and on. Reviewed hospital notes. Most likely related to anxiety. May need referral to cardiologist.  2. Anxiety and depression Worsening. PHQ9- 8 today. Advised patient to not take Topamax anymore-this could be making his symptoms worse.Will start Lorazepam, stop Xanax-patient did not tolerate this medication well. Advised to hold Diazepam for now-this has not been effective for insomnia so far. Advised patient to start with taking new medication at bedtime first. Recommended patient look in to seen a counselor.Re check in 2-4 weeks. In talking at length with the patient this seems to be situational. He seems to be having a lot of stress at work and this is the issue. He is actually looking for a new job. Things are good at home and other than work he  is feeling okay, he is just not sleeping because of this. He does not seem to be having any manic issues. Have recommended strongly that he seek out employee assistance program at work which is a free program that offers counseling. Her turn to clinic 2-4 weeks to see if the lorazepam is helping. He is advised of the sedation of this.  - LORazepam (ATIVAN) 1 MG tablet; 1/2 to 1 tablets three times daily as needed  Dispense: 90 tablet; Refill: 1  3. Morbid obesity  4. Insomnia New. Worsening. Re check on the next visit.  5. Sleep apnea Uses CPAP every night and tolerates it well.  6. Essential (primary) hypertension Stable. 7 chest pain Resolved and patient had negative cardiac workup. He has had several workups but never had a cardiac catheter. He may need the catheter in the future just to finally answer the question regarding this chest pain.  Patient was seen and examined by Dr. Bosie Closichard L Donia Yokum and note was scribed by Samara DeistAnastasiya Aleksandrova, RMA.   Julieanne Mansonichard Shamela Haydon MD [facility] 01/05/2015 2:47 PM

## 2015-01-06 ENCOUNTER — Other Ambulatory Visit: Payer: Self-pay | Admitting: Family Medicine

## 2015-01-26 ENCOUNTER — Ambulatory Visit: Payer: BLUE CROSS/BLUE SHIELD | Admitting: Family Medicine

## 2015-03-03 ENCOUNTER — Ambulatory Visit (INDEPENDENT_AMBULATORY_CARE_PROVIDER_SITE_OTHER): Payer: BLUE CROSS/BLUE SHIELD | Admitting: Family Medicine

## 2015-03-03 ENCOUNTER — Encounter: Payer: Self-pay | Admitting: Family Medicine

## 2015-03-03 VITALS — BP 122/60 | HR 70 | Temp 98.5°F | Resp 16 | Ht 72.0 in | Wt 301.2 lb

## 2015-03-03 DIAGNOSIS — Z0000 Encounter for general adult medical examination without abnormal findings: Secondary | ICD-10-CM | POA: Diagnosis not present

## 2015-03-03 LAB — POCT URINALYSIS DIPSTICK
BILIRUBIN UA: NEGATIVE
Blood, UA: NEGATIVE
GLUCOSE UA: NEGATIVE
Ketones, UA: NEGATIVE
Leukocytes, UA: NEGATIVE
Nitrite, UA: NEGATIVE
Protein, UA: NEGATIVE
SPEC GRAV UA: 1.02
Urobilinogen, UA: 0.2
pH, UA: 6.5

## 2015-03-03 NOTE — Progress Notes (Signed)
Patient: [name] [name]., Male    DOB: 01-10-1976, 39 y.o.   MRN: [id number] Visit Date: 03/03/2015  Today's Provider: [name], MD   Chief Complaint  Patient presents with  . Annual Exam   Subjective:    Annual physical exam [name] [name]. is a 39 y.o. male who presents today for health maintenance and complete physical. He feels well. He reports exercising a little 2-3 a week, mostly walking. He reports he is sleeping well.   EKG: 01/21/13 Tdap: 07/03/12 -----------------------------------------------------------------   Review of Systems  Constitutional: Negative.   HENT: Negative.   Eyes: Negative.   Cardiovascular: Negative.   Gastrointestinal: Negative.   Endocrine: Negative.   Genitourinary: Negative.   Musculoskeletal: Positive for back pain and arthralgias.  Skin: Negative.   Allergic/Immunologic: Positive for food allergies.  Neurological: Negative.   Hematological: Negative.   Psychiatric/Behavioral: Negative.     Social History He  reports that he quit smoking about 22 years ago. His smoking use included Cigarettes. He has a .9 pack-year smoking history. He has never used smokeless tobacco. He reports that he drinks alcohol. He reports that he does not use illicit drugs. Social History   Social History  . Marital Status: Married    Spouse Name: N/A  . Number of Children: N/A  . Years of Education: N/A   Social History Main Topics  . Smoking status: Former Smoker -- 0.30 packs/day for 3 years    Types: Cigarettes    Quit date: 08/09/1992  . Smokeless tobacco: Never Used  . Alcohol Use: Yes     Comment: occasional  . Drug Use: No  . Sexual Activity: Yes   Other Topics Concern  . None   Social History Narrative    Patient Active Problem List   Diagnosis Date Noted  . Anxiety and depression 12/27/2014  . Allergic rhinitis 12/27/2014  . Depression, major, single episode, mild 12/27/2014  . Essential (primary)  hypertension 12/27/2014  . Esophageal reflux 12/27/2014  . 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 Apnea, sleep 12/27/2014  . Avitaminosis D 12/27/2014  . Chest pain, central 12/23/2014    Past Surgical History  Procedure Laterality Date  . Lumbar spine surgery  11/08/2011  . Lumbar disc surgery    . Lumbar wound debridement  11/23/2011    Procedure: LUMBAR WOUND DEBRIDEMENT;  Surgeon: Karn Cassis, MD;  Location: [facility];  Service: Neurosurgery;  Laterality: N/A;  Incision and drainage of lumbar wound  . Back surgery    . Spine      spine spurs cleaned up and arthritis  . Lumbar disc surgery      bulging disc repaired    Family History  Family Status  Relation Status Death Age  . Mother Alive   . Father Alive    His family history includes CAD in his other; Diabetes in his mother; Heart attack in his maternal grandfather; Hyperlipidemia in his mother; Hypertension in his mother; Irregular heart beat in his maternal grandmother.    Allergies  Allergen Reactions  . Penicillins Anaphylaxis  . Shellfish Allergy Anaphylaxis  . Yellow Jacket Venom Shortness Of Breath    Previous Medications   ACETAMINOPHEN (TYLENOL) 500 MG TABLET    Take 1,000 mg by mouth every 6 (six) hours as needed for mild pain or headache.    ASPIRIN EC 81 MG EC TABLET    Take 1 tablet (81 mg  total) by mouth daily.   BUPROPION (WELLBUTRIN SR) 150 MG 12 HR TABLET    Take 1 tablet (150 mg total) by mouth 2 (two) times daily.   DIAZEPAM (VALIUM) 10 MG TABLET    Take 10 mg by mouth every 6 (six) hours as needed for anxiety.    EPINEPHRINE, ANAPHYLAXIS THERAPY AGENTS,    EPIPEN 2-PAK, 0.3MG /0.3ML (Injection Device)  1 Device as directed for 0 days  Quantity: 1;  Refills: 12   Ordered :05-Mar-2012  DeSanto, Elena ;  Started 05-Mar-2012 Active   ESCITALOPRAM (LEXAPRO) 20 MG TABLET    TAKE 1 BY MOUTH DAILY   LISINOPRIL (PRINIVIL,ZESTRIL) 40 MG TABLET    TAKE 1 BY MOUTH DAILY    LORAZEPAM (ATIVAN) 1 MG TABLET    1/2 to 1 tablets three times daily as needed   OMEPRAZOLE (PRILOSEC) 20 MG CAPSULE    Take by mouth.   OXYCODONE (ROXICODONE) 15 MG IMMEDIATE RELEASE TABLET    Take 15 mg by mouth every 4 (four) hours as needed for pain.    TRAMADOL (ULTRAM) 50 MG TABLET    Take by mouth.    Patient Care Team: Maple Hudson., MD as PCP - General (Unknown Physician Specialty)     Objective:   Vitals: BP 122/60 mmHg  Pulse 70  Temp(Src) 98.5 F (36.9 C) (Oral)  Resp 16  Ht 6' (1.829 m)  Wt 301 lb 3.2 oz (136.623 kg)  BMI 40.84 kg/m2   Physical Exam  Constitutional: He is oriented to person, place, and time. He appears well-developed and well-nourished.  Obese , muscular white male in no acute distress  HENT:  Head: Normocephalic and atraumatic.  Right Ear: External ear normal.  Left Ear: External ear normal.  Nose: Nose normal.  Mouth/Throat: Oropharynx is clear and moist.  Tonsils 2+ to 3+ without exudates or erythema  Eyes: Conjunctivae are normal.  Neck: Neck supple.  Cardiovascular: Normal rate, regular rhythm and normal heart sounds.   Pulmonary/Chest: Effort normal and breath sounds normal.  Abdominal: Soft.  Genitourinary: Penis normal.  Neurological: He is alert and oriented to person, place, and time.  Skin: Skin is warm and dry.  Psychiatric: He has a normal mood and affect. His behavior is normal. Judgment and thought content normal.     Depression Screen PHQ 2/9 Scores 01/05/2015 04/03/2012 12/14/2011  PHQ - 2 Score 1 0 0  PHQ- 9 Score 8 - -      Assessment & Plan:     Routine Health Maintenance and Physical Exam  Exercise Activities and Dietary recommendations Goals    None      Immunization History  Administered Date(s) Administered  . Tdap 07/03/2012    Health Maintenance  Topic Date Due  . HIV Screening  12/07/1988  . INFLUENZA VACCINE  01/19/2015  . TETANUS/TDAP  07/03/2022      Discussed health benefits  of physical activity, and encouraged him to engage in regular exercise appropriate for his age and condition.                 Obesity               D and E discussed at length with pt--goal waist size of 36 inches. --------------------------------------------------------------------

## 2015-03-04 LAB — LIPID PANEL WITH LDL/HDL RATIO
Cholesterol, Total: 194 mg/dL (ref 100–199)
HDL: 42 mg/dL (ref >39)
LDL Calculated: 118 mg/dL — ABNORMAL HIGH (ref 0–99)
LDl/HDL Ratio: 2.8 ratio units (ref 0.0–3.6)
Triglycerides: 169 mg/dL — ABNORMAL HIGH (ref 0–149)
VLDL CHOLESTEROL CAL: 34 mg/dL (ref 5–40)

## 2015-03-04 LAB — COMPREHENSIVE METABOLIC PANEL
A/G RATIO: 1.3 (ref 1.1–2.5)
ALK PHOS: 86 IU/L (ref 39–117)
ALT: 43 IU/L (ref 0–44)
AST: 39 IU/L (ref 0–40)
Albumin: 4.4 g/dL (ref 3.5–5.5)
BUN/Creatinine Ratio: 19 (ref 9–20)
BUN: 18 mg/dL (ref 6–24)
Bilirubin Total: 0.4 mg/dL (ref 0.0–1.2)
CALCIUM: 9.8 mg/dL (ref 8.7–10.2)
CO2: 25 mmol/L (ref 18–29)
Chloride: 98 mmol/L (ref 97–108)
Creatinine, Ser: 0.96 mg/dL (ref 0.76–1.27)
GFR calc Af Amer: 113 mL/min/{1.73_m2} (ref >59)
GFR calc non Af Amer: 98 mL/min/{1.73_m2} (ref >59)
Globulin, Total: 3.3 g/dL (ref 1.5–4.5)
Glucose: 108 mg/dL — ABNORMAL HIGH (ref 65–99)
POTASSIUM: 4.9 mmol/L (ref 3.5–5.2)
SODIUM: 139 mmol/L (ref 134–144)
Total Protein: 7.7 g/dL (ref 6.0–8.5)

## 2015-03-04 LAB — CBC WITH DIFFERENTIAL/PLATELET
Basophils Absolute: 0 10*3/uL (ref 0.0–0.2)
Basos: 0 %
EOS (ABSOLUTE): 0.2 10*3/uL (ref 0.0–0.4)
Eos: 2 %
HEMATOCRIT: 42.4 % (ref 37.5–51.0)
Hemoglobin: 14.7 g/dL (ref 12.6–17.7)
Immature Grans (Abs): 0 10*3/uL (ref 0.0–0.1)
Immature Granulocytes: 0 %
LYMPHS ABS: 2.6 10*3/uL (ref 0.7–3.1)
Lymphs: 27 %
MCH: 32 pg (ref 26.6–33.0)
MCHC: 34.7 g/dL (ref 31.5–35.7)
MCV: 92 fL (ref 79–97)
MONOS ABS: 0.8 10*3/uL (ref 0.1–0.9)
Monocytes: 8 %
Neutrophils Absolute: 6.1 10*3/uL (ref 1.4–7.0)
Neutrophils: 63 %
PLATELETS: 269 10*3/uL (ref 150–379)
RBC: 4.59 x10E6/uL (ref 4.14–5.80)
RDW: 14.2 % (ref 12.3–15.4)
WBC: 9.7 10*3/uL (ref 3.4–10.8)

## 2015-03-04 LAB — TSH: TSH: 0.839 u[IU]/mL (ref 0.450–4.500)

## 2015-03-09 ENCOUNTER — Telehealth: Payer: Self-pay

## 2015-03-09 NOTE — Telephone Encounter (Signed)
Advised  ED 

## 2015-03-09 NOTE — Telephone Encounter (Signed)
-----   Message from Maple Hudson., MD sent at 03/05/2015  8:53 AM EDT ----- Labs stable. Prediabetic. Work hard on diet and exercise and weight loss.

## 2015-05-20 ENCOUNTER — Ambulatory Visit (INDEPENDENT_AMBULATORY_CARE_PROVIDER_SITE_OTHER): Payer: BLUE CROSS/BLUE SHIELD | Admitting: Family Medicine

## 2015-05-20 ENCOUNTER — Encounter: Payer: Self-pay | Admitting: Family Medicine

## 2015-05-20 VITALS — BP 122/70 | HR 72 | Temp 98.3°F | Resp 16 | Wt 309.0 lb

## 2015-05-20 DIAGNOSIS — R42 Dizziness and giddiness: Secondary | ICD-10-CM

## 2015-05-20 DIAGNOSIS — E860 Dehydration: Secondary | ICD-10-CM

## 2015-05-20 MED ORDER — MECLIZINE HCL 25 MG PO TABS: 25.0000 mg | ORAL_TABLET | Freq: Three times a day (TID) | ORAL | Status: DC | PRN

## 2015-05-20 NOTE — Progress Notes (Signed)
Patient ID: [name] [name]., male   DOB: December 04, 1975, 39 y.o.   MRN: [id number]    Subjective:  HPI Pt reports that he has had dizziness for 3 days. He says it is actually better today. It ws worse when ever he is up moving around and walking and gets better when he lays down. He reports that when he opens his mouth his ears are popping a lot. Denies SOB, or chest pain. He has had some nausea when the dizziness was at its worst, but has not had any today. No DOE/anginal type symptoms. No syncope. Prior to Admission medications   Medication Sig Start Date End Date Taking? Authorizing Provider  acetaminophen (TYLENOL) 500 MG tablet Take 1,000 mg by mouth every 6 (six) hours as needed for mild pain or headache.    Yes Historical Provider, MD  aspirin EC 81 MG EC tablet Take 1 tablet (81 mg total) by mouth daily. 12/24/14  Yes Enedina Finner, MD  buPROPion (WELLBUTRIN SR) 150 MG 12 hr tablet Take 1 tablet (150 mg total) by mouth 2 (two) times daily. 12/17/14  Yes Malva Limes, MD  diazepam (VALIUM) 10 MG tablet Take 10 mg by mouth every 6 (six) hours as needed for anxiety.    Yes Historical Provider, MD  EPINEPHRINE, ANAPHYLAXIS THERAPY AGENTS, EPIPEN 2-PAK, 0.3MG /0.3ML (Injection Device)  1 Device as directed for 0 days  Quantity: 1;  Refills: 12   Ordered :05-Mar-2012  Janey Greaser ;  Started 05-Mar-2012 Active 03/05/12  Yes Historical Provider, MD  escitalopram (LEXAPRO) 20 MG tablet TAKE 1 BY MOUTH DAILY 01/07/15  Yes Maple Hudson., MD  lisinopril (PRINIVIL,ZESTRIL) 40 MG tablet TAKE 1 BY MOUTH DAILY 01/07/15  Yes Maple Hudson., MD  LORazepam (ATIVAN) 1 MG tablet 1/2 to 1 tablets three times daily as needed 01/05/15  Yes Richard Hulen Shouts., MD  omeprazole (PRILOSEC) 20 MG capsule Take by mouth. 01/21/13  Yes Historical Provider, MD  oxyCODONE (ROXICODONE) 15 MG immediate release tablet Take 15 mg by mouth every 4 (four) hours as needed for pain.    Yes Historical Provider, MD    traMADol (ULTRAM) 50 MG tablet Take by mouth.   Yes Historical Provider, MD    Patient Active Problem List   Diagnosis Date Noted  . Anxiety and depression 12/27/2014  . Allergic rhinitis 12/27/2014  . Depression, major, single episode, mild (HCC) 12/27/2014  . Essential (primary) hypertension 12/27/2014  . Esophageal reflux 12/27/2014  . 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 Apnea, sleep 12/27/2014  . Avitaminosis D 12/27/2014  . Chest pain, central 12/23/2014    Past Medical History  Diagnosis Date  . Hypertension   . GERD (gastroesophageal reflux disease)   . Depression   . Sleep apnea     cpap  . Headache(784.0)   . Depressed     Social History   Social History  . Marital Status: Married    Spouse Name: N/A  . Number of Children: N/A  . Years of Education: N/A   Occupational History  . Not on file.   Social History Main Topics  . Smoking status: Former Smoker -- 0.30 packs/day for 3 years    Types: Cigarettes    Quit date: 08/09/1992  . Smokeless tobacco: Never Used  . Alcohol Use: Yes     Comment: occasional  . Drug Use: No  . Sexual Activity: Yes   Other Topics Concern  . Not on  file   Social History Narrative    Allergies  Allergen Reactions  . Penicillins Anaphylaxis  . Shellfish Allergy Anaphylaxis  . Yellow Jacket Venom Shortness Of Breath    Review of Systems  Constitutional: Positive for malaise/fatigue.  HENT: Negative.   Eyes: Negative.   Respiratory: Negative.   Cardiovascular: Negative.   Gastrointestinal: Positive for nausea.  Genitourinary: Negative.   Musculoskeletal: Negative.   Skin: Negative.   Neurological: Negative.   Endo/Heme/Allergies: Negative.   Psychiatric/Behavioral: Negative.     Immunization History  Administered Date(s) Administered  . Tdap 07/03/2012   Objective:  BP 122/70 mmHg  Pulse 72  Temp(Src) 98.3 F (36.8 C) (Oral)  Resp 16  Wt 309 lb (140.161 kg)  SpO2  98%    Physical Exam  Constitutional: He is oriented to person, place, and time and well-developed, well-nourished, and in no distress.  HENT:  Head: Normocephalic and atraumatic.  Right Ear: External ear normal.  Left Ear: External ear normal.  Nose: Nose normal.  Mouth/Throat: Oropharynx is clear and moist.  TM's full  Eyes: Conjunctivae and EOM are normal. Pupils are equal, round, and reactive to light.  No Nystagmus  Neck: Normal range of motion. Neck supple.  Cardiovascular: Normal rate, regular rhythm, normal heart sounds and intact distal pulses.   Pulmonary/Chest: Effort normal and breath sounds normal.  Abdominal: Soft. Bowel sounds are normal.  Musculoskeletal: Normal range of motion.  Neurological: He is alert and oriented to person, place, and time. He has normal reflexes. Gait normal. GCS score is 15.  Skin: Skin is warm and dry.  Psychiatric: Mood, memory, affect and judgment normal.    Lab Results  Component Value Date   WBC 9.7 03/03/2015   HGB 13.1 12/23/2014   HCT 42.4 03/03/2015   PLT 223 12/23/2014   GLUCOSE 108* 03/03/2015   CHOL 194 03/03/2015   TRIG 169* 03/03/2015   HDL 42 03/03/2015   LDLCALC 118* 03/03/2015   TSH 0.839 03/03/2015   INR 1.07 11/23/2011   HGBA1C 5.9 02/20/2012    CMP     Component Value Date/Time   NA 139 03/03/2015 1514   NA 138 12/23/2014 1632   NA 134* 02/21/2012 0403   K 4.9 03/03/2015 1514   K 4.2 02/21/2012 0403   CL 98 03/03/2015 1514   CL 103 02/21/2012 0403   CO2 25 03/03/2015 1514   CO2 24 02/21/2012 0403   GLUCOSE 108* 03/03/2015 1514   GLUCOSE 114* 12/23/2014 1632   GLUCOSE 230* 02/21/2012 0403   BUN 18 03/03/2015 1514   BUN 15 12/23/2014 1632   BUN 19* 02/21/2012 0403   CREATININE 0.96 03/03/2015 1514   CREATININE 0.9 07/02/2012   CREATININE 1.26 02/21/2012 0403   CALCIUM 9.8 03/03/2015 1514   CALCIUM 9.3 02/21/2012 0403   PROT 7.7 03/03/2015 1514   PROT 7.9 02/21/2012 0403   ALBUMIN 4.4 03/03/2015  1514   ALBUMIN 3.8 02/21/2012 0403   AST 39 03/03/2015 1514   AST 33 02/21/2012 0403   ALT 43 03/03/2015 1514   ALT 71 02/21/2012 0403   ALKPHOS 86 03/03/2015 1514   ALKPHOS 92 02/21/2012 0403   BILITOT 0.4 03/03/2015 1514   BILITOT 0.3 02/21/2012 0403   GFRNONAA 98 03/03/2015 1514   GFRNONAA >60 02/21/2012 0403   GFRAA 113 03/03/2015 1514   GFRAA >60 02/21/2012 0403    Assessment and Plan :   1. Dizziness Possible superimposed URI. - EKG 12-Lead - meclizine (ANTIVERT) 25 MG  tablet; Take 1 tablet (25 mg total) by mouth 3 (three) times daily as needed for dizziness.  Dispense: 90 tablet; Refill: 1  2. Vertigo   3. Dehydration RTC 1-2- weeks. 4.HTN 5.Obesity   Patient was seen and examined by Dr. Julieanne Mansonichard Gilbert, and noted scribed by Dimas ChyleBrittany Byrd, CMA  Julieanne Mansonichard Gilbert MD [facility] 05/20/2015 10:44 AM

## 2015-06-03 ENCOUNTER — Ambulatory Visit: Payer: BLUE CROSS/BLUE SHIELD | Admitting: Family Medicine

## 2015-08-31 ENCOUNTER — Ambulatory Visit (INDEPENDENT_AMBULATORY_CARE_PROVIDER_SITE_OTHER): Payer: BLUE CROSS/BLUE SHIELD | Admitting: Family Medicine

## 2015-08-31 VITALS — BP 104/62 | HR 80 | Temp 98.4°F | Resp 16 | Wt 315.0 lb

## 2015-08-31 DIAGNOSIS — F418 Other specified anxiety disorders: Secondary | ICD-10-CM

## 2015-08-31 DIAGNOSIS — F329 Major depressive disorder, single episode, unspecified: Secondary | ICD-10-CM

## 2015-08-31 DIAGNOSIS — R739 Hyperglycemia, unspecified: Secondary | ICD-10-CM | POA: Diagnosis not present

## 2015-08-31 DIAGNOSIS — Z9989 Dependence on other enabling machines and devices: Secondary | ICD-10-CM

## 2015-08-31 DIAGNOSIS — I10 Essential (primary) hypertension: Secondary | ICD-10-CM

## 2015-08-31 DIAGNOSIS — G4733 Obstructive sleep apnea (adult) (pediatric): Secondary | ICD-10-CM | POA: Diagnosis not present

## 2015-08-31 DIAGNOSIS — F32A Depression, unspecified: Secondary | ICD-10-CM

## 2015-08-31 DIAGNOSIS — F419 Anxiety disorder, unspecified: Principal | ICD-10-CM

## 2015-08-31 LAB — POCT GLYCOSYLATED HEMOGLOBIN (HGB A1C): HEMOGLOBIN A1C: 6.3

## 2015-08-31 MED ORDER — DULOXETINE HCL 30 MG PO CPEP: 30.0000 mg | ORAL_CAPSULE | Freq: Every day | ORAL | Status: DC

## 2015-08-31 NOTE — Progress Notes (Signed)
Patient ID: [name] BowlRaymond E Finken Jr., male   DOB: 08-Feb-1976, 40 y.o.   MRN: [id number]    Subjective:  HPI  Patient is here for follow up. In September he had CPE and labs-sugar was 108 at that time. He does not check sugar at home. Today A1C is 6.3  Depression/anxiety-he takes Lexapro 20 mg daily, Bupropion 150 mg 2 tablets daily and Lorazepam at least 1 tablet a week. He does not feel like the medications are working as well as they were. He is having hard time. No really changes in his life to contribute to the symptoms, but does mention that his job is way more stressful now. PHQ 9 score today is 8. He feels like this is all due to his work.  Prior to Admission medications   Medication Sig Start Date End Date Taking? Authorizing Provider  acetaminophen (TYLENOL) 500 MG tablet Take 1,000 mg by mouth every 6 (six) hours as needed for mild pain or headache.     Historical Provider, MD  aspirin EC 81 MG EC tablet Take 1 tablet (81 mg total) by mouth daily. 12/24/14   Enedina FinnerSona Patel, MD  buPROPion (WELLBUTRIN SR) 150 MG 12 hr tablet Take 1 tablet (150 mg total) by mouth 2 (two) times daily. 12/17/14   Malva Limesonald E Fisher, MD  diazepam (VALIUM) 10 MG tablet Take 10 mg by mouth every 6 (six) hours as needed for anxiety.     Historical Provider, MD  EPINEPHRINE, ANAPHYLAXIS THERAPY AGENTS, EPIPEN 2-PAK, 0.3MG /0.3ML (Injection Device)  1 Device as directed for 0 days  Quantity: 1;  Refills: 12   Ordered :05-Mar-2012  Janey GreaserDeSanto, Elena ;  Started 05-Mar-2012 Active 03/05/12   Historical Provider, MD  escitalopram (LEXAPRO) 20 MG tablet TAKE 1 BY MOUTH DAILY 01/07/15   Maple Hudsonichard L Gilbert Jr., MD  lisinopril (PRINIVIL,ZESTRIL) 40 MG tablet TAKE 1 BY MOUTH DAILY 01/07/15   Maple Hudsonichard L Gilbert Jr., MD  LORazepam (ATIVAN) 1 MG tablet 1/2 to 1 tablets three times daily as needed 01/05/15   Maple Hudsonichard L Gilbert Jr., MD  LYRICA 150 MG capsule Take 150 mg by mouth 2 (two) times daily. 07/15/15   Historical Provider, MD  meclizine  (ANTIVERT) 25 MG tablet Take 1 tablet (25 mg total) by mouth 3 (three) times daily as needed for dizziness. 05/20/15   Richard Hulen ShoutsL Gilbert Jr., MD  omeprazole (PRILOSEC) 20 MG capsule Take by mouth. 01/21/13   Historical Provider, MD  oxyCODONE (ROXICODONE) 15 MG immediate release tablet Take 15 mg by mouth every 4 (four) hours as needed for pain.     Historical Provider, MD  traMADol (ULTRAM) 50 MG tablet Take by mouth.    Historical Provider, MD    Patient Active Problem List   Diagnosis Date Noted  . Anxiety and depression 12/27/2014  . Allergic rhinitis 12/27/2014  . Depression, major, single episode, mild (HCC) 12/27/2014  . Essential (primary) hypertension 12/27/2014  . Esophageal reflux 12/27/2014  . 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 Apnea, sleep 12/27/2014  . Avitaminosis D 12/27/2014  . Chest pain, central 12/23/2014    Past Medical History  Diagnosis Date  . Hypertension   . GERD (gastroesophageal reflux disease)   . Depression   . Sleep apnea     cpap  . Headache(784.0)   . Depressed     Social History   Social History  . Marital Status: Married    Spouse Name: N/A  . Number of Children: N/A  .  Years of Education: N/A   Occupational History  . Not on file.   Social History Main Topics  . Smoking status: Former Smoker -- 0.30 packs/day for 3 years    Types: Cigarettes    Quit date: 08/09/1992  . Smokeless tobacco: Never Used  . Alcohol Use: Yes     Comment: occasional  . Drug Use: No  . Sexual Activity: Yes   Other Topics Concern  . Not on file   Social History Narrative    Allergies  Allergen Reactions  . Penicillins Anaphylaxis  . Shellfish Allergy Anaphylaxis  . Yellow Jacket Venom Shortness Of Breath    Review of Systems  Constitutional: Positive for malaise/fatigue.  Respiratory: Negative.   Cardiovascular: Negative.   Musculoskeletal: Positive for back pain.  Psychiatric/Behavioral: Positive for depression.  The patient is nervous/anxious and has insomnia.     Immunization History  Administered Date(s) Administered  . Tdap 07/03/2012   Objective:  BP 104/62 mmHg  Pulse 80  Temp(Src) 98.4 F (36.9 C)  Resp 16  Wt 315 lb (142.883 kg)  Physical Exam  Constitutional: He is oriented to person, place, and time and well-developed, well-nourished, and in no distress.  Morbidly obese white male in no acute distress. Ruddy complexion  HENT:  Head: Normocephalic and atraumatic.  Right Ear: External ear normal.  Left Ear: External ear normal.  Nose: Nose normal.  Eyes: Conjunctivae are normal. Pupils are equal, round, and reactive to light.  Neck: Normal range of motion. Neck supple.  Cardiovascular: Normal rate, regular rhythm, normal heart sounds and intact distal pulses.   No murmur heard. Pulmonary/Chest: Effort normal and breath sounds normal. No respiratory distress. He has no wheezes.  Abdominal: Soft.  Musculoskeletal: Normal range of motion. He exhibits no edema or tenderness.  Neurological: He is alert and oriented to person, place, and time.  Skin: Skin is warm and dry.  Psychiatric: Mood, memory, affect and judgment normal.    Lab Results  Component Value Date   WBC 9.7 03/03/2015   HGB 13.1 12/23/2014   HCT 42.4 03/03/2015   PLT 269 03/03/2015   GLUCOSE 108* 03/03/2015   CHOL 194 03/03/2015   TRIG 169* 03/03/2015   HDL 42 03/03/2015   LDLCALC 118* 03/03/2015   TSH 0.839 03/03/2015   INR 1.07 11/23/2011   HGBA1C 5.9 02/20/2012    CMP     Component Value Date/Time   NA 139 03/03/2015 1514   NA 138 12/23/2014 1632   NA 134* 02/21/2012 0403   K 4.9 03/03/2015 1514   K 4.2 02/21/2012 0403   CL 98 03/03/2015 1514   CL 103 02/21/2012 0403   CO2 25 03/03/2015 1514   CO2 24 02/21/2012 0403   GLUCOSE 108* 03/03/2015 1514   GLUCOSE 114* 12/23/2014 1632   GLUCOSE 230* 02/21/2012 0403   BUN 18 03/03/2015 1514   BUN 15 12/23/2014 1632   BUN 19* 02/21/2012 0403    CREATININE 0.96 03/03/2015 1514   CREATININE 0.9 07/02/2012   CREATININE 1.26 02/21/2012 0403   CALCIUM 9.8 03/03/2015 1514   CALCIUM 9.3 02/21/2012 0403   PROT 7.7 03/03/2015 1514   PROT 7.9 02/21/2012 0403   ALBUMIN 4.4 03/03/2015 1514   ALBUMIN 3.8 02/21/2012 0403   AST 39 03/03/2015 1514   AST 33 02/21/2012 0403   ALT 43 03/03/2015 1514   ALT 71 02/21/2012 0403   ALKPHOS 86 03/03/2015 1514   ALKPHOS 92 02/21/2012 0403   BILITOT 0.4 03/03/2015 1514  BILITOT 0.3 02/21/2012 0403   GFRNONAA 98 03/03/2015 1514   GFRNONAA >60 02/21/2012 0403   GFRAA 113 03/03/2015 1514   GFRAA >60 02/21/2012 0403    Assessment and Plan :  1. Anxiety and depression Patient not suicidal Worsening. PHQ 9 score 8 today. Will switch from Lexapro to Cymbalta. Re check in 1 to 2 months.  2. Essential (primary) hypertension Stable.  3. Morbid obesity, unspecified obesity type (HCC) Work on habits.  4. Hyperglycemia A1C 6.3 today. Worsening. Discussed this with patient at length that he really needs to work on his habits. Follow, may need to start medication if this does not improve. - POCT HgB A1C  5. OSA on CPAP Patient uses CPAP every night, tolerates it well. Symptoms are controlled at this time. patient feels much better since being on CPAP  Julieanne Manson MD [facility] 08/31/2015 4:28 PM

## 2015-10-06 ENCOUNTER — Other Ambulatory Visit: Payer: Self-pay

## 2015-10-06 MED ORDER — DULOXETINE HCL 30 MG PO CPEP: 30.0000 mg | ORAL_CAPSULE | Freq: Every day | ORAL | Status: DC

## 2015-10-29 ENCOUNTER — Ambulatory Visit (INDEPENDENT_AMBULATORY_CARE_PROVIDER_SITE_OTHER): Payer: BLUE CROSS/BLUE SHIELD | Admitting: Family Medicine

## 2015-10-29 VITALS — BP 118/82 | HR 102 | Temp 98.6°F | Resp 16 | Wt 313.0 lb

## 2015-10-29 DIAGNOSIS — F418 Other specified anxiety disorders: Secondary | ICD-10-CM

## 2015-10-29 DIAGNOSIS — R739 Hyperglycemia, unspecified: Secondary | ICD-10-CM | POA: Diagnosis not present

## 2015-10-29 DIAGNOSIS — Z9989 Dependence on other enabling machines and devices: Secondary | ICD-10-CM

## 2015-10-29 DIAGNOSIS — G4733 Obstructive sleep apnea (adult) (pediatric): Secondary | ICD-10-CM | POA: Diagnosis not present

## 2015-10-29 DIAGNOSIS — F32A Depression, unspecified: Secondary | ICD-10-CM

## 2015-10-29 DIAGNOSIS — F419 Anxiety disorder, unspecified: Principal | ICD-10-CM

## 2015-10-29 DIAGNOSIS — F329 Major depressive disorder, single episode, unspecified: Secondary | ICD-10-CM

## 2015-10-29 NOTE — Progress Notes (Signed)
Patient ID: [name] [name]., male   DOB: [birth year], 40 y.o.   MRN: [id number]    Subjective:  HPI  Patient is here for 2 months follow up. On last visit Lexapro was switched to Cymbalta. Patient asked his wife and she told patient she noticed he is better on the medication. Patient states they have dealt with a lot of family stress right now and feels like things will be much better when things get settled. Depression screen [facility] 2/9 08/31/2015 01/05/2015 04/03/2012 12/14/2011  Decreased Interest 1 1 0 0  Down, Depressed, Hopeless 3 0 0 0  PHQ - 2 Score 4 1 0 0  Altered sleeping 1 3 - -  Tired, decreased energy 3 2 - -  Change in appetite 0 1 - -  Feeling bad or failure about yourself  0 1 - -  Trouble concentrating 0 0 - -  Moving slowly or fidgety/restless 0 0 - -  Suicidal thoughts 0 0 - -  PHQ-9 Score 8 8 - -  Difficult doing work/chores Somewhat difficult Very difficult - -     Prior to Admission medications   Medication Sig Start Date End Date Taking? Authorizing Provider  acetaminophen (TYLENOL) 500 MG tablet Take 1,000 mg by mouth every 6 (six) hours as needed for mild pain or headache.     Historical Provider, MD  aspirin EC 81 MG EC tablet Take 1 tablet (81 mg total) by mouth daily. 12/24/14   Enedina Finner, MD  buPROPion (WELLBUTRIN SR) 150 MG 12 hr tablet Take 1 tablet (150 mg total) by mouth 2 (two) times daily. 12/17/14   Malva Limes, MD  diazepam (VALIUM) 10 MG tablet Take 10 mg by mouth every 6 (six) hours as needed for anxiety.     Historical Provider, MD  DULoxetine (CYMBALTA) 30 MG capsule Take 1 capsule (30 mg total) by mouth daily. 10/06/15   Richard Hulen Shouts., MD  EPINEPHRINE, ANAPHYLAXIS THERAPY AGENTS, EPIPEN 2-PAK, 0.3MG /0.3ML (Injection Device)  1 Device as directed for 0 days  Quantity: 1;  Refills: 12   Ordered :05-Mar-2012  Janey Greaser ;  Started 05-Mar-2012 Active 03/05/12   Historical Provider, MD  lisinopril (PRINIVIL,ZESTRIL) 40 MG tablet  TAKE 1 BY MOUTH DAILY 01/07/15   Maple Hudson., MD  LORazepam (ATIVAN) 1 MG tablet 1/2 to 1 tablets three times daily as needed 01/05/15   Maple Hudson., MD  LYRICA 150 MG capsule Take 150 mg by mouth 2 (two) times daily. 07/15/15   Historical Provider, MD  meclizine (ANTIVERT) 25 MG tablet Take 1 tablet (25 mg total) by mouth 3 (three) times daily as needed for dizziness. 05/20/15   Richard Hulen Shouts., MD  omeprazole (PRILOSEC) 20 MG capsule Take by mouth. 01/21/13   Historical Provider, MD  oxyCODONE (ROXICODONE) 15 MG immediate release tablet Take 15 mg by mouth every 4 (four) hours as needed for pain.     Historical Provider, MD  traMADol (ULTRAM) 50 MG tablet Take by mouth.    Historical Provider, MD    Patient Active Problem List   Diagnosis Date Noted  . Anxiety and depression 12/27/2014  . Allergic rhinitis 12/27/2014  . Depression, major, single episode, mild (HCC) 12/27/2014  . Essential (primary) hypertension 12/27/2014  . Esophageal reflux 12/27/2014  . 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 Apnea, sleep 12/27/2014  . Avitaminosis D 12/27/2014  . Chest pain, central 12/23/2014    Past  Medical History  Diagnosis Date  . Hypertension   . GERD (gastroesophageal reflux disease)   . Depression   . Sleep apnea     cpap  . Headache(784.0)   . Depressed     Social History   Social History  . Marital Status: Married    Spouse Name: N/A  . Number of Children: N/A  . Years of Education: N/A   Occupational History  . Not on file.   Social History Main Topics  . Smoking status: Former Smoker -- 0.30 packs/day for 3 years    Types: Cigarettes    Quit date: 08/09/1992  . Smokeless tobacco: Never Used  . Alcohol Use: Yes     Comment: occasional  . Drug Use: No  . Sexual Activity: Yes   Other Topics Concern  . Not on file   Social History Narrative    Allergies  Allergen Reactions  . Penicillins Anaphylaxis  . Shellfish  Allergy Anaphylaxis  . Yellow Jacket Venom Shortness Of Breath    Review of Systems  Constitutional: Negative.   Respiratory: Negative.   Cardiovascular: Negative.   Musculoskeletal: Positive for back pain and joint pain.  Psychiatric/Behavioral: Positive for depression. The patient is nervous/anxious.     Immunization History  Administered Date(s) Administered  . Tdap 07/03/2012   Objective:  BP 118/82 mmHg  Pulse 102  Temp(Src) 98.6 F (37 C)  Resp 16  Wt 313 lb (141.976 kg)  Physical Exam  Constitutional: He is oriented to person, place, and time and well-developed, well-nourished, and in no distress.  HENT:  Head: Normocephalic and atraumatic.  Right Ear: External ear normal.  Left Ear: External ear normal.  Nose: Nose normal.  Eyes: Conjunctivae are normal. Pupils are equal, round, and reactive to light.  Neck: Normal range of motion. Neck supple.  Cardiovascular: Normal rate, regular rhythm, normal heart sounds and intact distal pulses.   No murmur heard. Pulmonary/Chest: Effort normal and breath sounds normal. No respiratory distress. He has no wheezes.  Abdominal: Soft.  Neurological: He is alert and oriented to person, place, and time. Gait normal.  Skin: Skin is warm and dry.  Psychiatric: Mood, memory, affect and judgment normal.    Lab Results  Component Value Date   WBC 9.7 03/03/2015   HGB 13.1 12/23/2014   HCT 42.4 03/03/2015   PLT 269 03/03/2015   GLUCOSE 108* 03/03/2015   CHOL 194 03/03/2015   TRIG 169* 03/03/2015   HDL 42 03/03/2015   LDLCALC 118* 03/03/2015   TSH 0.839 03/03/2015   INR 1.07 11/23/2011   HGBA1C 6.3 08/31/2015    CMP     Component Value Date/Time   NA 139 03/03/2015 1514   NA 138 12/23/2014 1632   NA 134* 02/21/2012 0403   K 4.9 03/03/2015 1514   K 4.2 02/21/2012 0403   CL 98 03/03/2015 1514   CL 103 02/21/2012 0403   CO2 25 03/03/2015 1514   CO2 24 02/21/2012 0403   GLUCOSE 108* 03/03/2015 1514   GLUCOSE 114*  12/23/2014 1632   GLUCOSE 230* 02/21/2012 0403   BUN 18 03/03/2015 1514   BUN 15 12/23/2014 1632   BUN 19* 02/21/2012 0403   CREATININE 0.96 03/03/2015 1514   CREATININE 0.9 07/02/2012   CREATININE 1.26 02/21/2012 0403   CALCIUM 9.8 03/03/2015 1514   CALCIUM 9.3 02/21/2012 0403   PROT 7.7 03/03/2015 1514   PROT 7.9 02/21/2012 0403   ALBUMIN 4.4 03/03/2015 1514   ALBUMIN 3.8 02/21/2012  0403   AST 39 03/03/2015 1514   AST 33 02/21/2012 0403   ALT 43 03/03/2015 1514   ALT 71 02/21/2012 0403   ALKPHOS 86 03/03/2015 1514   ALKPHOS 92 02/21/2012 0403   BILITOT 0.4 03/03/2015 1514   BILITOT 0.3 02/21/2012 0403   GFRNONAA 98 03/03/2015 1514   GFRNONAA >60 02/21/2012 0403   GFRAA 113 03/03/2015 1514   GFRAA >60 02/21/2012 0403    Assessment and Plan :  1. Anxiety and depression Better. Once family stressors decrease patient will feel better too I think. Follow.  2. Morbid obesity, unspecified obesity type [facility]) Advised patient he needs to work on habits.  3. Hyperglycemia  4. OSA Patient is using CPAP nightly and is tolerating it well.  5. Morbid obesity  Diet and exercise discussed with patient. I have done the exam and reviewed the above chart and it is accurate to the best of my knowledge.  Patient was seen and examined by Dr. Bosie Clos and note was scribed by Samara Deist, RMA.    Julieanne Manson MD [facility] 10/29/2015 4:38 PM

## 2015-11-09 ENCOUNTER — Emergency Department
Admission: EM | Admit: 2015-11-09 | Discharge: 2015-11-09 | Disposition: A | Discharge status: Home / Self Care | Payer: BLUE CROSS/BLUE SHIELD | Attending: Emergency Medicine | Admitting: Emergency Medicine

## 2015-11-09 DIAGNOSIS — Z7982 Long term (current) use of aspirin: Secondary | ICD-10-CM | POA: Diagnosis not present

## 2015-11-09 DIAGNOSIS — M5441 Lumbago with sciatica, right side: Secondary | ICD-10-CM | POA: Diagnosis not present

## 2015-11-09 DIAGNOSIS — Z87891 Personal history of nicotine dependence: Secondary | ICD-10-CM | POA: Diagnosis not present

## 2015-11-09 DIAGNOSIS — I10 Essential (primary) hypertension: Secondary | ICD-10-CM | POA: Insufficient documentation

## 2015-11-09 DIAGNOSIS — F329 Major depressive disorder, single episode, unspecified: Secondary | ICD-10-CM | POA: Insufficient documentation

## 2015-11-09 DIAGNOSIS — M545 Low back pain: Secondary | ICD-10-CM | POA: Diagnosis present

## 2015-11-09 DIAGNOSIS — M5440 Lumbago with sciatica, unspecified side: Secondary | ICD-10-CM

## 2015-11-09 MED ORDER — HYDROMORPHONE HCL 1 MG/ML IJ SOLN
1.0000 mg | INTRAMUSCULAR | Status: AC
  Administered 2015-11-09: 1 mg via INTRAMUSCULAR

## 2015-11-09 MED ORDER — CYCLOBENZAPRINE HCL 10 MG PO TABS: 10.0000 mg | ORAL_TABLET | Freq: Three times a day (TID) | ORAL | Status: DC | PRN

## 2015-11-09 MED ORDER — HYDROMORPHONE HCL 1 MG/ML IJ SOLN
INTRAMUSCULAR | Status: AC
  Administered 2015-11-09: 1 mg via INTRAMUSCULAR
  Filled 2015-11-09: qty 1

## 2015-11-09 MED ORDER — PREDNISONE 10 MG (21) PO TBPK: ORAL_TABLET | ORAL | Status: DC

## 2015-11-09 MED ORDER — OXYCODONE-ACETAMINOPHEN 5-325 MG PO TABS: 1.0000 | ORAL_TABLET | Freq: Four times a day (QID) | ORAL | Status: DC | PRN

## 2015-11-09 NOTE — Discharge Instructions (Signed)

## 2015-11-09 NOTE — ED Provider Notes (Signed)
[facility] Emergency Department Provider Note ____________________________________________  Time seen: Approximately 10:32 AM  I have reviewed the triage vital signs and the nursing notes.   HISTORY  Chief Complaint No chief complaint on file.    HPI [name] BowlRaymond E Rhatigan Jr. is a 40 y.o. male who presents to the emergency department for evaluation of back pain. He has a history of chronic back pain with history of 2 back surgeries. He is currently taking Lyrica and Tramadol without relief. He states that on Saturday he went to bend over and pain shot down his right leg into his foot and nothing he has tried has relieved any of the pain.   Past Medical History  Diagnosis Date  . Hypertension   . GERD (gastroesophageal reflux disease)   . Depression   . Sleep apnea     cpap  . Headache(784.0)   . Depressed     Patient Active Problem List   Diagnosis Date Noted  . Anxiety and depression 12/27/2014  . Allergic rhinitis 12/27/2014  . Depression, major, single episode, mild (HCC) 12/27/2014  . Essential (primary) hypertension 12/27/2014  . Esophageal reflux 12/27/2014  . 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 Apnea, sleep 12/27/2014  . Avitaminosis D 12/27/2014  . Chest pain, central 12/23/2014    Past Surgical History  Procedure Laterality Date  . Lumbar spine surgery  11/08/2011  . Lumbar disc surgery    . Lumbar wound debridement  11/23/2011    Procedure: LUMBAR WOUND DEBRIDEMENT;  Surgeon: Karn CassisErnesto M Botero, MD;  Location: [facility];  Service: Neurosurgery;  Laterality: N/A;  Incision and drainage of lumbar wound  . Back surgery    . Spine      spine spurs cleaned up and arthritis  . Lumbar disc surgery      bulging disc repaired    Current Outpatient Rx  Name  Route  Sig  Dispense  Refill  . aspirin EC 81 MG EC tablet   Oral   Take 1 tablet (81 mg total) by mouth daily. Patient not taking: Reported on 10/29/2015   30  tablet   0   . buPROPion (WELLBUTRIN SR) 150 MG 12 hr tablet   Oral   Take 1 tablet (150 mg total) by mouth 2 (two) times daily.   60 tablet   3     Changing dose to twice daily instead of 1 daily   . cyclobenzaprine (FLEXERIL) 10 MG tablet   Oral   Take 1 tablet (10 mg total) by mouth 3 (three) times daily as needed for muscle spasms.   30 tablet   0   . diazepam (VALIUM) 10 MG tablet   Oral   Take 10 mg by mouth every 6 (six) hours as needed for anxiety.          . DULoxetine (CYMBALTA) 30 MG capsule   Oral   Take 1 capsule (30 mg total) by mouth daily.   90 capsule   1   . EPINEPHRINE, ANAPHYLAXIS THERAPY AGENTS,      EPIPEN 2-PAK, 0.3MG /0.3ML (Injection Device)  1 Device as directed for 0 days  Quantity: 1;  Refills: 12   Ordered :05-Mar-2012  Janey GreaserDeSanto, Elena ;  Started 05-Mar-2012 Active         . lisinopril (PRINIVIL,ZESTRIL) 40 MG tablet      TAKE 1 BY MOUTH DAILY   90 tablet   3   . LORazepam (ATIVAN) 1 MG tablet  1/2 to 1 tablets three times daily as needed   90 tablet   1   . LYRICA 150 MG capsule   Oral   Take 150 mg by mouth 2 (two) times daily.      2     Dispense as written.   . meclizine (ANTIVERT) 25 MG tablet   Oral   Take 1 tablet (25 mg total) by mouth 3 (three) times daily as needed for dizziness. Patient not taking: Reported on 10/29/2015   90 tablet   1   . oxyCODONE-acetaminophen (ROXICET) 5-325 MG tablet   Oral   Take 1 tablet by mouth every 6 (six) hours as needed.   20 tablet   0   . predniSONE (STERAPRED UNI-PAK 21 TAB) 10 MG (21) TBPK tablet      Take 6 tablets on day 1 Take 5 tablets on day 2 Take 4 tablets on day 3 Take 3 tablets on day 4 Take 2 tablets on day 5 Take 1 tablet on day 6   21 tablet   0     Allergies Penicillins; Shellfish allergy; and Yellow jacket venom  Family History  Problem Relation Age of Onset  . Diabetes Mother   . Hypertension Mother   . Hyperlipidemia Mother   . CAD  Other   . Irregular heart beat Maternal Grandmother   . Heart attack Maternal Grandfather     Social History Social History  Substance Use Topics  . Smoking status: Former Smoker -- 0.30 packs/day for 3 years    Types: Cigarettes    Quit date: 08/09/1992  . Smokeless tobacco: Never Used  . Alcohol Use: Yes     Comment: occasional    Review of Systems Constitutional: No recent illness. Cardiovascular: Denies chest pain or palpitations. Respiratory: Denies shortness of breath. Musculoskeletal: Pain in right lower back with radiation into the right lower extremity. Skin: Negative for rash, wound, lesion. Neurological: Negative for focal weakness or numbness.  ____________________________________________   PHYSICAL EXAM:  VITAL SIGNS: ED Triage Vitals  Enc Vitals Group     BP --      Pulse --      Resp --      Temp --      Temp src --      SpO2 --      Weight --      Height --      Head Cir --      Peak Flow --      Pain Score --      Pain Loc --      Pain Edu? --      Excl. in GC? --     Constitutional: Alert and oriented. Well appearing and in no acute distress. Eyes: Conjunctivae are normal. EOMI. Head: Atraumatic. Neck: No stridor.  Respiratory: Normal respiratory effort.   Musculoskeletal: Pain in the right lower back with radiation into the right foot. Worse with movement. No saddle anesthesia. Neurologic:  Normal speech and language. No gross focal neurologic deficits are appreciated. Speech is normal. No gait instability. Skin:  Skin is warm, dry and intact. Atraumatic. Psychiatric: Mood and affect are normal. Speech and behavior are normal.  ____________________________________________   LABS (all labs ordered are listed, but only abnormal results are displayed)  Labs Reviewed - No data to display ____________________________________________  RADIOLOGY  Not indicated. ____________________________________________   PROCEDURES  Procedure(s)  performed: None   ____________________________________________   INITIAL IMPRESSION / ASSESSMENT AND PLAN /  ED COURSE  Pertinent labs & imaging results that were available during my care of the patient were reviewed by me and considered in my medical decision making (see chart for details).  Patient advised to follow up with his Neurosurgeon. He will be given prescriptions for roxicet, prednisone and flexeril. He was instructed to return to the ER for symptoms that change or worsen. ____________________________________________   FINAL CLINICAL IMPRESSION(S) / ED DIAGNOSES  Final diagnoses:  Back pain of lumbar region with sciatica       Chinita Pester, FNP 11/09/15 1543  Rockne Menghini, MD 11/09/15 1556

## 2015-11-12 DIAGNOSIS — M5416 Radiculopathy, lumbar region: Secondary | ICD-10-CM | POA: Diagnosis not present

## 2015-11-12 DIAGNOSIS — M545 Low back pain: Secondary | ICD-10-CM | POA: Diagnosis not present

## 2015-12-01 DIAGNOSIS — M5416 Radiculopathy, lumbar region: Secondary | ICD-10-CM | POA: Diagnosis not present

## 2015-12-23 ENCOUNTER — Other Ambulatory Visit: Payer: Self-pay | Admitting: Anesthesiology

## 2015-12-23 DIAGNOSIS — M5416 Radiculopathy, lumbar region: Secondary | ICD-10-CM | POA: Diagnosis not present

## 2015-12-23 DIAGNOSIS — Z6841 Body Mass Index (BMI) 40.0 and over, adult: Secondary | ICD-10-CM | POA: Diagnosis not present

## 2016-01-15 ENCOUNTER — Ambulatory Visit
Admission: RE | Admit: 2016-01-15 | Discharge: 2016-01-15 | Disposition: A | Discharge status: Home / Self Care | Payer: BLUE CROSS/BLUE SHIELD | Source: Ambulatory Visit | Attending: Anesthesiology | Admitting: Anesthesiology

## 2016-01-15 DIAGNOSIS — M5126 Other intervertebral disc displacement, lumbar region: Secondary | ICD-10-CM | POA: Diagnosis not present

## 2016-01-15 DIAGNOSIS — M5416 Radiculopathy, lumbar region: Secondary | ICD-10-CM

## 2016-01-22 NOTE — Telephone Encounter (Signed)
error 

## 2016-01-28 ENCOUNTER — Ambulatory Visit: Payer: BLUE CROSS/BLUE SHIELD | Admitting: Family Medicine

## 2016-03-10 ENCOUNTER — Other Ambulatory Visit: Payer: Self-pay | Admitting: Family Medicine

## 2016-03-16 ENCOUNTER — Other Ambulatory Visit: Payer: Self-pay

## 2016-03-23 DIAGNOSIS — M5416 Radiculopathy, lumbar region: Secondary | ICD-10-CM | POA: Diagnosis not present

## 2016-03-23 DIAGNOSIS — M545 Low back pain: Secondary | ICD-10-CM | POA: Diagnosis not present

## 2016-05-17 ENCOUNTER — Encounter: Payer: Self-pay | Admitting: Family Medicine

## 2016-05-17 ENCOUNTER — Ambulatory Visit (INDEPENDENT_AMBULATORY_CARE_PROVIDER_SITE_OTHER): Payer: BLUE CROSS/BLUE SHIELD | Admitting: Family Medicine

## 2016-05-17 VITALS — BP 126/82 | HR 78 | Temp 98.6°F | Resp 16 | Ht 72.0 in | Wt 308.0 lb

## 2016-05-17 DIAGNOSIS — Z125 Encounter for screening for malignant neoplasm of prostate: Secondary | ICD-10-CM

## 2016-05-17 DIAGNOSIS — Z0000 Encounter for general adult medical examination without abnormal findings: Secondary | ICD-10-CM

## 2016-05-17 NOTE — Progress Notes (Signed)
Patient: [name] BowlRaymond E Peretti Jr., Male    DOB: 02-14-1976, 40 y.o.   MRN: [id number] Visit Date: 05/17/2016  Today's Provider: Megan Mansichard Gilbert Jr, MD   Chief Complaint  Patient presents with  . Annual Exam   Subjective:  [name] BowlRaymond E Urbieta Jr. is a 40 y.o. male who presents today for health maintenance and complete physical. He feels well. He reports exercising none. He reports he is sleeping well. He uses his CPAP nightly. He gets no regular exercise. Overall he feels well.  Immunization History  Administered Date(s) Administered  . Tdap 07/03/2012    Colonoscopy- never Immunizations UTD  Review of Systems  Constitutional: Negative.   HENT: Negative.   Eyes: Negative.   Respiratory: Negative.   Cardiovascular: Negative.   Gastrointestinal: Negative.   Endocrine: Negative.   Genitourinary: Negative.   Musculoskeletal: Negative.   Skin: Negative.   Allergic/Immunologic: Positive for food allergies.  Neurological: Negative.   Hematological: Negative.   Psychiatric/Behavioral: The patient is nervous/anxious.     Social History   Social History  . Marital status: Married    Spouse name: N/A  . Number of children: N/A  . Years of education: N/A   Occupational History  . Not on file.   Social History Main Topics  . Smoking status: Former Smoker    Packs/day: 0.30    Years: 3.00    Types: Cigarettes    Quit date: 08/09/1992  . Smokeless tobacco: Current User    Types: Chew  . Alcohol use 1.8 - 2.4 oz/week    3 - 4 Cans of beer per week     Comment: occasional  . Drug use: No  . Sexual activity: Yes   Other Topics Concern  . Not on file   Social History Narrative  . No narrative on file    Patient Active Problem List   Diagnosis Date Noted  . Anxiety and depression 12/27/2014  . Allergic rhinitis 12/27/2014  . Depression, major, single episode, mild (HCC) 12/27/2014  . Essential (primary) hypertension 12/27/2014  . Esophageal reflux 12/27/2014  .  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 Apnea, sleep 12/27/2014  . Avitaminosis D 12/27/2014  . Chest pain, central 12/23/2014    Past Surgical History:  Procedure Laterality Date  . BACK SURGERY    . LUMBAR DISC SURGERY    . LUMBAR DISC SURGERY     bulging disc repaired  . LUMBAR SPINE SURGERY  11/08/2011  . LUMBAR WOUND DEBRIDEMENT  11/23/2011   Procedure: LUMBAR WOUND DEBRIDEMENT;  Surgeon: Karn CassisErnesto M Botero, MD;  Location: [facility];  Service: Neurosurgery;  Laterality: N/A;  Incision and drainage of lumbar wound  . spine     spine spurs cleaned up and arthritis    His family history includes CAD in his other; Diabetes in his mother; Heart attack in his maternal grandfather; Hyperlipidemia in his mother; Hypertension in his mother; Irregular heart beat in his maternal grandmother.     Outpatient Encounter Prescriptions as of 05/17/2016  Medication Sig Note  . aspirin EC 81 MG EC tablet Take 1 tablet (81 mg total) by mouth daily.   . diazepam (VALIUM) 10 MG tablet Take 10 mg by mouth every 6 (six) hours as needed for anxiety.    Marland Kitchen. EPINEPHRINE, ANAPHYLAXIS THERAPY AGENTS, EPIPEN 2-PAK, 0.3MG /0.3ML (Injection Device)  1 Device as directed for 0 days  Quantity: 1;  Refills: 12   Ordered :05-Mar-2012  Janey GreaserDeSanto, Elena ;  Started 05-Mar-2012 Active  12/27/2014: [facility]  . lisinopril (PRINIVIL,ZESTRIL) 40 MG tablet TAKE 1 BY MOUTH DAILY   . LORazepam (ATIVAN) 1 MG tablet 1/2 to 1 tablets three times daily as needed   . LYRICA 150 MG capsule Take 150 mg by mouth 2 (two) times daily. 08/31/2015: Received from: External Pharmacy  . meclizine (ANTIVERT) 25 MG tablet Take 1 tablet (25 mg total) by mouth 3 (three) times daily as needed for dizziness.   . traMADol (ULTRAM) 50 MG tablet Take by mouth every 6 (six) hours as needed.   . [DISCONTINUED] buPROPion (WELLBUTRIN SR) 150 MG 12 hr tablet Take 1 tablet (150 mg total) by mouth 2  (two) times daily.   . [DISCONTINUED] cyclobenzaprine (FLEXERIL) 10 MG tablet Take 1 tablet (10 mg total) by mouth 3 (three) times daily as needed for muscle spasms.   . [DISCONTINUED] DULoxetine (CYMBALTA) 30 MG capsule Take 1 capsule (30 mg total) by mouth daily.   . [DISCONTINUED] oxyCODONE-acetaminophen (ROXICET) 5-325 MG tablet Take 1 tablet by mouth every 6 (six) hours as needed.   . [DISCONTINUED] predniSONE (STERAPRED UNI-PAK 21 TAB) 10 MG (21) TBPK tablet Take 6 tablets on day 1 Take 5 tablets on day 2 Take 4 tablets on day 3 Take 3 tablets on day 4 Take 2 tablets on day 5 Take 1 tablet on day 6    No facility-administered encounter medications on file as of 05/17/2016.     Patient Care Team: Maple Hudsonichard L Gilbert Jr., MD as PCP - General (Unknown Physician Specialty)      Objective:   Vitals:  Vitals:   05/17/16 1359  BP: 126/82  Pulse: 78  Resp: 16  Temp: 98.6 F (37 C)  TempSrc: Oral  Weight: (!) 308 lb (139.7 kg)  Height: 6' (1.829 m)    Physical Exam  Constitutional: He is oriented to person, place, and time. He appears well-developed and well-nourished.  Pleasant, cooperative, morbidly obese white male in no acute distress.  HENT:  Head: Normocephalic and atraumatic.  Right Ear: External ear normal.  Left Ear: External ear normal.  Nose: Nose normal.  Mouth/Throat: Oropharynx is clear and moist.  Eyes: Conjunctivae and EOM are normal. Pupils are equal, round, and reactive to light.  Neck: Normal range of motion. Neck supple.  Cardiovascular: Normal rate, regular rhythm, normal heart sounds and intact distal pulses.   Pulmonary/Chest: Effort normal and breath sounds normal.  Abdominal: Soft. Bowel sounds are normal.  Genitourinary: Rectum normal, prostate normal and penis normal.  Musculoskeletal: Normal range of motion.  Neurological: He is alert and oriented to person, place, and time.  Skin: Skin is warm and dry.  Psychiatric: He has a normal mood and  affect. His behavior is normal. Judgment and thought content normal.     Depression Screen PHQ 2/9 Scores 05/17/2016 08/31/2015 01/05/2015 04/03/2012  PHQ - 2 Score 0 4 1 0  PHQ- 9 Score - 8 8 -      Assessment & Plan:     Routine Health Maintenance and Physical Exam  Exercise Activities and Dietary recommendations Goals    None      Immunization History  Administered Date(s) Administered  . Tdap 07/03/2012    Health Maintenance  Topic Date Due  . HIV Screening  12/07/1988  . INFLUENZA VACCINE  08/30/2016 (Originally 01/19/2016)  . TETANUS/TDAP  07/03/2022    Overall health is good. Regular exercise stressed. Discussed health benefits of physical activity, and encouraged him to engage  in regular exercise appropriate for his age and condition.  Obesity Dietary changes discussed at length. Hypertension Obstructive sleep apnea Continue nightly CPAP Status post lumbar laminectomy  I have done the exam and reviewed the chart and it is accurate to the best of my knowledge. Dentist has been used and  any errors in dictation or transcription are unintentional. Julieanne Manson M.D. [facility]

## 2016-05-18 ENCOUNTER — Telehealth: Payer: Self-pay

## 2016-05-18 LAB — CBC WITH DIFFERENTIAL/PLATELET
BASOS: 0 %
Basophils Absolute: 0 10*3/uL (ref 0.0–0.2)
EOS (ABSOLUTE): 0.1 10*3/uL (ref 0.0–0.4)
EOS: 1 %
Hematocrit: 40.3 % (ref 37.5–51.0)
Hemoglobin: 14.1 g/dL (ref 12.6–17.7)
IMMATURE GRANS (ABS): 0 10*3/uL (ref 0.0–0.1)
IMMATURE GRANULOCYTES: 0 %
LYMPHS: 28 %
Lymphocytes Absolute: 2.5 10*3/uL (ref 0.7–3.1)
MCH: 30.9 pg (ref 26.6–33.0)
MCHC: 35 g/dL (ref 31.5–35.7)
MCV: 88 fL (ref 79–97)
MONOS ABS: 0.5 10*3/uL (ref 0.1–0.9)
Monocytes: 6 %
NEUTROS PCT: 65 %
Neutrophils Absolute: 5.6 10*3/uL (ref 1.4–7.0)
PLATELETS: 270 10*3/uL (ref 150–379)
RBC: 4.56 x10E6/uL (ref 4.14–5.80)
RDW: 14.7 % (ref 12.3–15.4)
WBC: 8.7 10*3/uL (ref 3.4–10.8)

## 2016-05-18 LAB — COMPREHENSIVE METABOLIC PANEL
ALT: 65 IU/L — AB (ref 0–44)
AST: 53 IU/L — AB (ref 0–40)
Albumin/Globulin Ratio: 1.6 (ref 1.2–2.2)
Albumin: 4.5 g/dL (ref 3.5–5.5)
Alkaline Phosphatase: 82 IU/L (ref 39–117)
BUN/Creatinine Ratio: 18 (ref 9–20)
BUN: 16 mg/dL (ref 6–24)
Bilirubin Total: 0.3 mg/dL (ref 0.0–1.2)
CALCIUM: 9.6 mg/dL (ref 8.7–10.2)
CO2: 21 mmol/L (ref 18–29)
CREATININE: 0.88 mg/dL (ref 0.76–1.27)
Chloride: 97 mmol/L (ref 96–106)
GFR, EST AFRICAN AMERICAN: 122 mL/min/{1.73_m2} (ref >59)
GFR, EST NON AFRICAN AMERICAN: 106 mL/min/{1.73_m2} (ref >59)
Globulin, Total: 2.8 g/dL (ref 1.5–4.5)
Glucose: 111 mg/dL — ABNORMAL HIGH (ref 65–99)
Potassium: 4.4 mmol/L (ref 3.5–5.2)
Sodium: 137 mmol/L (ref 134–144)
TOTAL PROTEIN: 7.3 g/dL (ref 6.0–8.5)

## 2016-05-18 LAB — LIPID PANEL WITH LDL/HDL RATIO
Cholesterol, Total: 228 mg/dL — ABNORMAL HIGH (ref 100–199)
HDL: 52 mg/dL (ref >39)
LDL CALC: 155 mg/dL — AB (ref 0–99)
LDL/HDL RATIO: 3 ratio (ref 0.0–3.6)
TRIGLYCERIDES: 104 mg/dL (ref 0–149)
VLDL CHOLESTEROL CAL: 21 mg/dL (ref 5–40)

## 2016-05-18 LAB — TSH: TSH: 0.966 u[IU]/mL (ref 0.450–4.500)

## 2016-05-18 LAB — PSA: Prostate Specific Ag, Serum: 0.7 ng/mL (ref 0.0–4.0)

## 2016-05-18 NOTE — Telephone Encounter (Signed)
-----   Message from Maple Hudsonichard L Gilbert Jr., MD sent at 05/18/2016  8:02 AM EST ----- Prediabetic. Mild fatty liver. Work on diet and exercise as discussed.

## 2016-05-18 NOTE — Telephone Encounter (Signed)
[id number] 

## 2016-05-19 NOTE — Telephone Encounter (Signed)
Patient was advised. KW 

## 2016-05-25 LAB — POCT URINALYSIS DIPSTICK
Bilirubin, UA: NEGATIVE
Glucose, UA: NEGATIVE
KETONES UA: NEGATIVE
Leukocytes, UA: NEGATIVE
Nitrite, UA: NEGATIVE
PH UA: 6.5
RBC UA: NEGATIVE
SPEC GRAV UA: 1.025
UROBILINOGEN UA: NEGATIVE

## 2016-05-31 DIAGNOSIS — M545 Low back pain: Secondary | ICD-10-CM | POA: Diagnosis not present

## 2016-05-31 DIAGNOSIS — I10 Essential (primary) hypertension: Secondary | ICD-10-CM | POA: Diagnosis not present

## 2016-05-31 DIAGNOSIS — M5416 Radiculopathy, lumbar region: Secondary | ICD-10-CM | POA: Diagnosis not present

## 2016-05-31 DIAGNOSIS — Z6841 Body Mass Index (BMI) 40.0 and over, adult: Secondary | ICD-10-CM | POA: Diagnosis not present

## 2016-06-02 DIAGNOSIS — G4733 Obstructive sleep apnea (adult) (pediatric): Secondary | ICD-10-CM | POA: Diagnosis not present

## 2016-06-27 ENCOUNTER — Ambulatory Visit (INDEPENDENT_AMBULATORY_CARE_PROVIDER_SITE_OTHER): Payer: BLUE CROSS/BLUE SHIELD | Admitting: Family Medicine

## 2016-06-27 VITALS — BP 124/90 | HR 92 | Resp 18 | Wt 316.0 lb

## 2016-06-27 DIAGNOSIS — E661 Drug-induced obesity: Secondary | ICD-10-CM

## 2016-06-27 DIAGNOSIS — F339 Major depressive disorder, recurrent, unspecified: Secondary | ICD-10-CM | POA: Diagnosis not present

## 2016-06-27 DIAGNOSIS — F338 Other recurrent depressive disorders: Secondary | ICD-10-CM

## 2016-06-27 DIAGNOSIS — I10 Essential (primary) hypertension: Secondary | ICD-10-CM | POA: Diagnosis not present

## 2016-06-27 DIAGNOSIS — Z6841 Body Mass Index (BMI) 40.0 and over, adult: Secondary | ICD-10-CM | POA: Diagnosis not present

## 2016-06-27 MED ORDER — SERTRALINE HCL 50 MG PO TABS: 50.0000 mg | ORAL_TABLET | Freq: Every day | ORAL | 5 refills | Status: DC

## 2016-06-27 MED ORDER — LITHIUM CARBONATE 150 MG PO CAPS: 150.0000 mg | ORAL_CAPSULE | Freq: Two times a day (BID) | ORAL | 5 refills | Status: DC

## 2016-06-27 NOTE — Progress Notes (Signed)
[name] [name].  MRN: [id number] DOB: 07/17/1975  Subjective:  HPI  Patient has had worsening with anxiety since around Christmas time 2017. No specific events that lead to this. Has noticed when he gets really worked up over something he develops chest pains. Does not think this cardiac related, he has had stress test and other tests to evaluate his heart before and everything was ok. He has been taking Lorazepam off and on but has not noticed much of a relief with the medication overall. Depression screen PHQ 2/9 06/27/2016  Decreased Interest 1  Down, Depressed, Hopeless 1  PHQ - 2 Score 2  Altered sleeping 2  Tired, decreased energy 2  Change in appetite 1  Feeling bad or failure about yourself  1  Trouble concentrating 1  Moving slowly or fidgety/restless 0  Suicidal thoughts 0  PHQ-9 Score 9  Difficult doing work/chores Very difficult   His past medications for this symptom include Lexapro, Bupropion and Cymbalta-ineffective were main issues with those and he would wean himself off them by himself. Patient Active Problem List   Diagnosis Date Noted  . Anxiety and depression 12/27/2014  . Allergic rhinitis 12/27/2014  . Depression, major, single episode, mild (HCC) 12/27/2014  . Essential (primary) hypertension 12/27/2014  . Esophageal reflux 12/27/2014  . 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 Apnea, sleep 12/27/2014  . Avitaminosis D 12/27/2014  . Chest pain, central 12/23/2014    Past Medical History:  Diagnosis Date  . Depressed   . Depression   . GERD (gastroesophageal reflux disease)   . Headache(784.0)   . Hypertension   . Sleep apnea    cpap    Social History   Social History  . Marital status: Married    Spouse name: N/A  . Number of children: N/A  . Years of education: N/A   Occupational History  . Not on file.   Social History Main Topics  . Smoking status: Former Smoker    Packs/day: 0.30    Years: 3.00   Types: Cigarettes    Quit date: 08/09/1992  . Smokeless tobacco: Current User    Types: Chew  . Alcohol use 1.8 - 2.4 oz/week    3 - 4 Cans of beer per week     Comment: occasional  . Drug use: No  . Sexual activity: Yes   Other Topics Concern  . Not on file   Social History Narrative  . No narrative on file    Outpatient Encounter Prescriptions as of 06/27/2016  Medication Sig Note  . aspirin EC 81 MG EC tablet Take 1 tablet (81 mg total) by mouth daily.   . diazepam (VALIUM) 10 MG tablet Take 10 mg by mouth every 6 (six) hours as needed for anxiety.    Marland Kitchen EPINEPHRINE, ANAPHYLAXIS THERAPY AGENTS, EPIPEN 2-PAK, 0.3MG /0.3ML (Injection Device)  1 Device as directed for 0 days  Quantity: 1;  Refills: 12   Ordered :05-Mar-2012  Janey Greaser ;  Started 05-Mar-2012 Active 12/27/2014: Received from: [facility]  . lisinopril (PRINIVIL,ZESTRIL) 40 MG tablet TAKE 1 BY MOUTH DAILY   . LORazepam (ATIVAN) 1 MG tablet 1/2 to 1 tablets three times daily as needed   . LYRICA 150 MG capsule Take 150 mg by mouth 2 (two) times daily. 08/31/2015: Received from: External Pharmacy  . meclizine (ANTIVERT) 25 MG tablet Take 1 tablet (25 mg total) by mouth 3 (three) times daily as needed for dizziness.   Marland Kitchen  traMADol (ULTRAM) 50 MG tablet Take by mouth every 6 (six) hours as needed.    No facility-administered encounter medications on file as of 06/27/2016.     Allergies  Allergen Reactions  . Penicillins Anaphylaxis  . Shellfish Allergy Anaphylaxis  . Yellow Jacket Venom Shortness Of Breath    Review of Systems  Constitutional: Positive for malaise/fatigue.       Over eating  Respiratory: Negative.   Cardiovascular: Negative.   Musculoskeletal: Negative.   Psychiatric/Behavioral: The patient is nervous/anxious and has insomnia.     Objective:  BP 124/90   Pulse 92   Resp 18   Wt (!) 316 lb (143.3 kg)   BMI 42.86 kg/m   Physical Exam  Constitutional: He is oriented to  person, place, and time and well-developed, well-nourished, and in no distress.  HENT:  Head: Normocephalic and atraumatic.  Eyes: Conjunctivae are normal. Pupils are equal, round, and reactive to light.  Neck: Normal range of motion. Neck supple.  Cardiovascular: Normal rate, regular rhythm, normal heart sounds and intact distal pulses.   No murmur heard. Pulmonary/Chest: Effort normal and breath sounds normal. No respiratory distress. He has no wheezes.  Musculoskeletal: He exhibits no edema or tenderness.  Neurological: He is alert and oriented to person, place, and time.    Assessment and Plan :  1. Seasonal affective disorder (HCC) I think this is what patient has. Try Zoloft and due to mood up and down add Lithium also. Re check in 1 month. PHQ9 score today 9.  2. Essential (primary) hypertension Stable.  3. 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 Depression/mood disorder May need Abilify but I'm reluctant to start this and the patient at his size. May also need psychiatry referral. He is not suicidal. HPI, Exam and A&P transcribed under direction and in the presence of Julieanne Mansonichard Chrisie Jankovich, MD. I have done the exam and reviewed the chart and it is accurate to the best of my knowledge. DentistDragon  technology has been used and  any errors in dictation or transcription are unintentional. Julieanne Mansonichard Noemi Ishmael M.D. [facility]

## 2016-07-25 ENCOUNTER — Ambulatory Visit (INDEPENDENT_AMBULATORY_CARE_PROVIDER_SITE_OTHER): Payer: BLUE CROSS/BLUE SHIELD | Admitting: Family Medicine

## 2016-07-25 VITALS — BP 114/80 | HR 98 | Temp 99.7°F | Resp 16 | Wt 312.0 lb

## 2016-07-25 DIAGNOSIS — F320 Major depressive disorder, single episode, mild: Secondary | ICD-10-CM

## 2016-07-25 DIAGNOSIS — G4733 Obstructive sleep apnea (adult) (pediatric): Secondary | ICD-10-CM | POA: Diagnosis not present

## 2016-07-25 DIAGNOSIS — I10 Essential (primary) hypertension: Secondary | ICD-10-CM | POA: Diagnosis not present

## 2016-07-25 NOTE — Progress Notes (Signed)
[name] [name].  MRN: [id number] DOB: 01-18-1976  Subjective:  HPI  Patient is here for 1 month follow up. Last visit was on 06/27/16. Sertraline and Lithium was started. He feels like he is about 50%better. He has noticed since starting medications he has had diarrhea, not severe but noticeable. Today has not felt well, fatigue. Denies any pain anywhere.  Patient Active Problem List   Diagnosis Date Noted  . Seasonal affective disorder (HCC) 06/27/2016  . Anxiety and depression 12/27/2014  . Allergic rhinitis 12/27/2014  . Depression, major, single episode, mild (HCC) 12/27/2014  . Essential (primary) hypertension 12/27/2014  . Esophageal reflux 12/27/2014  . 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 Apnea, sleep 12/27/2014  . Avitaminosis D 12/27/2014  . Chest pain, central 12/23/2014    Past Medical History:  Diagnosis Date  . Depressed   . Depression   . GERD (gastroesophageal reflux disease)   . Headache(784.0)   . Hypertension   . Sleep apnea    cpap    Social History   Social History  . Marital status: Married    Spouse name: N/A  . Number of children: N/A  . Years of education: N/A   Occupational History  . Not on file.   Social History Main Topics  . Smoking status: Former Smoker    Packs/day: 0.30    Years: 3.00    Types: Cigarettes    Quit date: 08/09/1992  . Smokeless tobacco: Current User    Types: Chew  . Alcohol use 1.8 - 2.4 oz/week    3 - 4 Cans of beer per week     Comment: occasional  . Drug use: No  . Sexual activity: Yes   Other Topics Concern  . Not on file   Social History Narrative  . No narrative on file    Outpatient Encounter Prescriptions as of 07/25/2016  Medication Sig Note  . aspirin EC 81 MG EC tablet Take 1 tablet (81 mg total) by mouth daily.   . diazepam (VALIUM) 10 MG tablet Take 10 mg by mouth every 6 (six) hours as needed for anxiety.    Marland Kitchen EPINEPHRINE, ANAPHYLAXIS THERAPY AGENTS, EPIPEN  2-PAK, 0.3MG /0.3ML (Injection Device)  1 Device as directed for 0 days  Quantity: 1;  Refills: 12   Ordered :05-Mar-2012  Janey Greaser ;  Started 05-Mar-2012 Active 12/27/2014: Received from: [facility]  . lisinopril (PRINIVIL,ZESTRIL) 40 MG tablet TAKE 1 BY MOUTH DAILY   . lithium carbonate 150 MG capsule Take 1 capsule (150 mg total) by mouth 2 (two) times daily with a meal.   . LORazepam (ATIVAN) 1 MG tablet 1/2 to 1 tablets three times daily as needed   . LYRICA 150 MG capsule Take 150 mg by mouth 2 (two) times daily. 08/31/2015: Received from: External Pharmacy  . meclizine (ANTIVERT) 25 MG tablet Take 1 tablet (25 mg total) by mouth 3 (three) times daily as needed for dizziness.   . sertraline (ZOLOFT) 50 MG tablet Take 1 tablet (50 mg total) by mouth daily.   . traMADol (ULTRAM) 50 MG tablet Take by mouth every 6 (six) hours as needed.    No facility-administered encounter medications on file as of 07/25/2016.     Allergies  Allergen Reactions  . Penicillins Anaphylaxis  . Shellfish Allergy Anaphylaxis  . Yellow Jacket Venom Shortness Of Breath    Review of Systems  Constitutional: Positive for malaise/fatigue.  Respiratory: Negative.   Cardiovascular: Negative.  Gastrointestinal: Positive for diarrhea.  Musculoskeletal: Negative.   Psychiatric/Behavioral: Positive for depression. The patient is nervous/anxious.     Objective:  BP 114/80   Pulse 98   Temp 99.7 F (37.6 C)   Resp 16   Wt (!) 312 lb (141.5 kg)   BMI 42.31 kg/m   Physical Exam  Constitutional: He is oriented to person, place, and time and well-developed, well-nourished, and in no distress.  HENT:  Head: Normocephalic and atraumatic.  Right Ear: External ear normal.  Left Ear: External ear normal.  Nose: Nose normal.  Eyes: Conjunctivae are normal. No scleral icterus.  Neck: Neck supple. No thyromegaly present.  Cardiovascular: Normal rate, regular rhythm and normal heart  sounds.   Pulmonary/Chest: Effort normal and breath sounds normal.  Abdominal: Soft.  Neurological: He is alert and oriented to person, place, and time.  Skin: Skin is warm and dry.  Psychiatric: Mood, memory, affect and judgment normal.    Assessment and Plan :  Depression Improved. Obesity HTN RTC 3-4 months. I have done the exam and reviewed the chart and it is accurate to the best of my knowledge. DentistDragon  technology has been used and  any errors in dictation or transcription are unintentional. Julieanne Mansonichard Choua Chalker M.D. [facility]

## 2016-10-27 ENCOUNTER — Ambulatory Visit (INDEPENDENT_AMBULATORY_CARE_PROVIDER_SITE_OTHER): Payer: BLUE CROSS/BLUE SHIELD | Admitting: Family Medicine

## 2016-10-27 VITALS — BP 124/82 | HR 100 | Temp 98.8°F | Resp 16 | Wt 280.0 lb

## 2016-10-27 DIAGNOSIS — F320 Major depressive disorder, single episode, mild: Secondary | ICD-10-CM | POA: Diagnosis not present

## 2016-10-27 DIAGNOSIS — I10 Essential (primary) hypertension: Secondary | ICD-10-CM

## 2016-10-27 DIAGNOSIS — R739 Hyperglycemia, unspecified: Secondary | ICD-10-CM

## 2016-10-27 DIAGNOSIS — E669 Obesity, unspecified: Secondary | ICD-10-CM

## 2016-10-27 LAB — POCT GLYCOSYLATED HEMOGLOBIN (HGB A1C): Hemoglobin A1C: 5.7

## 2016-10-27 MED ORDER — LISINOPRIL 20 MG PO TABS: 20.0000 mg | ORAL_TABLET | Freq: Every day | ORAL | 3 refills | Status: DC

## 2016-10-27 NOTE — Patient Instructions (Signed)
You are doing a great job! Continue great work.

## 2016-10-27 NOTE — Progress Notes (Signed)
[name] [name].  MRN: [id number] DOB: 14-Jun-1976  Subjective:  HPI  Patient is here for follow up. Last office visit was on 07/25/16. Diabetes: patient is not checking his sugar at home. He has been working out and cutting out carbs and has lost 32 lbs since his last visit in February.  Lab Results  Component Value Date   HGBA1C 6.3 08/31/2015   B/P: patient is not checking his b/p. No cardiac symptoms. He has noticed that he gets spells of lightheadedness that last for about 30 minutes when he is extra active and on the go. No other symptoms present when this happens. He wonders if it could be due to weight loss and b/p dropping maybe.  BP Readings from Last 3 Encounters:  10/27/16 124/82  07/25/16 114/80  06/27/16 124/90   Wt Readings from Last 3 Encounters:  10/27/16 280 lb (127 kg)  07/25/16 (!) 312 lb (141.5 kg)  06/27/16 (!) 316 lb (143.3 kg)   Depression: patient feels like he is doing pretty good about 50% better like last time. He is taking Lithium and Zoloft. Patient has a lot going on right now.  Depression screen [facility] 2/9 10/27/2016 06/27/2016 05/17/2016  Decreased Interest 0 1 0  Down, Depressed, Hopeless 0 1 0  PHQ - 2 Score 0 2 0  Altered sleeping 0 2 -  Tired, decreased energy 0 2 -  Change in appetite 0 1 -  Feeling bad or failure about yourself  0 1 -  Trouble concentrating 0 1 -  Moving slowly or fidgety/restless 0 0 -  Suicidal thoughts 0 0 -  PHQ-9 Score 0 9 -  Difficult doing work/chores - Very difficult -    Patient Active Problem List   Diagnosis Date Noted  . Seasonal affective disorder (HCC) 06/27/2016  . Anxiety and depression 12/27/2014  . Allergic rhinitis 12/27/2014  . Depression, major, single episode, mild (HCC) 12/27/2014  . Essential (primary) hypertension 12/27/2014  . Esophageal reflux 12/27/2014  . 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 Apnea, sleep 12/27/2014  . Avitaminosis D 12/27/2014  . Chest pain,  central 12/23/2014    Past Medical History:  Diagnosis Date  . Depressed   . Depression   . GERD (gastroesophageal reflux disease)   . Headache(784.0)   . Hypertension   . Sleep apnea    cpap    Social History   Social History  . Marital status: Married    Spouse name: N/A  . Number of children: N/A  . Years of education: N/A   Occupational History  . Not on file.   Social History Main Topics  . Smoking status: Former Smoker    Packs/day: 0.30    Years: 3.00    Types: Cigarettes    Quit date: 08/09/1992  . Smokeless tobacco: Current User    Types: Chew  . Alcohol use 1.8 - 2.4 oz/week    3 - 4 Cans of beer per week     Comment: occasional  . Drug use: No  . Sexual activity: Yes   Other Topics Concern  . Not on file   Social History Narrative  . No narrative on file    Outpatient Encounter Prescriptions as of 10/27/2016  Medication Sig Note  . diazepam (VALIUM) 10 MG tablet Take 10 mg by mouth every 6 (six) hours as needed for anxiety.    Marland Kitchen EPINEPHRINE, ANAPHYLAXIS THERAPY AGENTS, EPIPEN 2-PAK, 0.3MG /0.3ML (Injection Device)  1 Device  as directed for 0 days  Quantity: 1;  Refills: 12   Ordered :05-Mar-2012  Janey GreaserDeSanto, Elena ;  Started 05-Mar-2012 Active 12/27/2014: [facility]  . lisinopril (PRINIVIL,ZESTRIL) 40 MG tablet TAKE 1 BY MOUTH DAILY   . lithium carbonate 150 MG capsule Take 1 capsule (150 mg total) by mouth 2 (two) times daily with a meal.   . LORazepam (ATIVAN) 1 MG tablet 1/2 to 1 tablets three times daily as needed   . sertraline (ZOLOFT) 50 MG tablet Take 1 tablet (50 mg total) by mouth daily.   Marland Kitchen. aspirin EC 81 MG EC tablet Take 1 tablet (81 mg total) by mouth daily. (Patient not taking: Reported on 10/27/2016)   . LYRICA 150 MG capsule Take 150 mg by mouth 2 (two) times daily. 08/31/2015: Received from: External Pharmacy  . meclizine (ANTIVERT) 25 MG tablet Take 1 tablet (25 mg total) by mouth 3 (three) times daily  as needed for dizziness. (Patient not taking: Reported on 10/27/2016)   . traMADol (ULTRAM) 50 MG tablet Take by mouth every 6 (six) hours as needed.    No facility-administered encounter medications on file as of 10/27/2016.     Allergies  Allergen Reactions  . Penicillins Anaphylaxis  . Shellfish Allergy Anaphylaxis  . Yellow Jacket Venom Shortness Of Breath    Review of Systems  Constitutional: Negative.   Respiratory: Negative.   Cardiovascular: Negative.   Musculoskeletal: Positive for back pain (better with weight loss).  Neurological: Negative.   Psychiatric/Behavioral:       Stable on the medication.    Objective:  BP 124/82   Pulse 100   Temp 98.8 F (37.1 C)   Resp 16   Wt 280 lb (127 kg)   BMI 37.97 kg/m   Physical Exam  Constitutional: He is oriented to person, place, and time and well-developed, well-nourished, and in no distress.  HENT:  Head: Normocephalic and atraumatic.  Eyes: Conjunctivae are normal. Pupils are equal, round, and reactive to light.  Neck: Normal range of motion. Neck supple.  Cardiovascular: Normal rate, regular rhythm, normal heart sounds and intact distal pulses.  Exam reveals no gallop.   No murmur heard. Pulmonary/Chest: Effort normal and breath sounds normal. No respiratory distress. He has no wheezes.  Musculoskeletal: He exhibits no edema or tenderness.  Neurological: He is alert and oriented to person, place, and time.  Psychiatric: Mood, memory, affect and judgment normal.   Assessment and Plan :  1. Depression, major, single episode, mild (HCC) Better/stable. Continue working on habits.  2. Essential (primary) hypertension Stable. With so much weight loss-32 lbs and symptoms patient is having will decrease lisinopril to 20 mg daily and will follow up on this in 3 months.  3. Hyperglycemia A1C 5.7. Much better. Continue current regimen. - POCT HgB A1C  4. Obesity (BMI 35.0-39.9 without comorbidity) Continue working on  habits. Patient lost 2 lbs intentionally since February 2018. Very proud of this patient.  HPI, Exam and A&P transcribed by Samara DeistAnastasiya Aleksandrova, RMA under direction and in the presence of Julieanne Mansonichard Obdulio Mash, MD. I have done the exam and reviewed the chart and it is accurate to the best of my knowledge. [employer]ist[employer]agon  technology has been used and  any errors in dictation or transcription are unintentional. Julieanne Mansonichard Keino Placencia M.D. [facility]

## 2016-11-15 ENCOUNTER — Ambulatory Visit: Payer: BLUE CROSS/BLUE SHIELD | Admitting: Family Medicine

## 2017-01-02 ENCOUNTER — Ambulatory Visit: Payer: Self-pay | Admitting: Medical

## 2017-01-02 ENCOUNTER — Ambulatory Visit
Admission: RE | Admit: 2017-01-02 | Discharge: 2017-01-02 | Disposition: A | Discharge status: Home / Self Care | Payer: BLUE CROSS/BLUE SHIELD | Source: Ambulatory Visit | Attending: Medical | Admitting: Medical

## 2017-01-02 ENCOUNTER — Encounter: Payer: Self-pay | Admitting: Medical

## 2017-01-02 VITALS — BP 128/82 | HR 80 | Temp 98.1°F | Wt 262.8 lb

## 2017-01-02 DIAGNOSIS — M79671 Pain in right foot: Secondary | ICD-10-CM | POA: Insufficient documentation

## 2017-01-02 DIAGNOSIS — R7301 Impaired fasting glucose: Secondary | ICD-10-CM

## 2017-01-02 MED ORDER — PREDNISONE 10 MG (21) PO TBPK: ORAL_TABLET | ORAL | 0 refills | Status: DC

## 2017-01-02 NOTE — Progress Notes (Signed)
   Subjective:    Patient ID: [name] [name]., male    DOB: 11-26-1975, 41 y.o.   MRN: [id number]  HPI  41 yo male  Exercising yesterday with jogging about one mile and then did pool exercising  4 laps. Rested inside about one hour and  46 minutes got back up and then had sharp pain. Pain in medial side of ankle and heel, throbbing, pain no numbness or tingling. No prior injury. Took  Ibuprofen '400mg'$   At  7am, No relief.  If elevated feels better if shoe feels better. Lost on exercising 50lbs.   Review of Systems  Constitutional: Negative for chills and fever.  HENT: Negative.   Eyes: Negative.   Respiratory: Negative for cough and shortness of breath.   Cardiovascular: Negative for chest pain.  Gastrointestinal: Negative for abdominal pain.  Genitourinary: Negative for dysuria and hematuria.  Musculoskeletal: Positive for joint swelling.  Skin: Negative for color change, pallor, rash and wound.  Allergic/Immunologic: Positive for environmental allergies.       Objective:   Physical Exam  Constitutional: He is oriented to person, place, and time. He appears well-developed and well-nourished.  HENT:  Head: Normocephalic and atraumatic.  Eyes: Pupils are equal, round, and reactive to light. Conjunctivae and EOM are normal.  Neck: Normal range of motion.  Musculoskeletal:       Feet:  Neurological: He is alert and oriented to person, place, and time.  Skin: Skin is warm and dry.  Psychiatric: He has a normal mood and affect. His behavior is normal. Judgment and thought content normal.  Nursing note and vitals reviewed.    Tender along the medial side of foot to heel and non tender at medial malleolus.     Assessment & Plan:  Right foot pain  plantar fasciitis  Vs Gout vs strain. History of plantar fasciitis in the left foot years ago.  CBC and Met C and uric acide will also check his  A1C has not had it checked since March.  Patient called and reviewed x-ray no  fracture or dislocation.  Patient he says he left work about  2 pm secondary to pain. He was offered crutches but decline while in clinic.  Still pending blood work will placed patient on Prednisone taper 10 mg take 6 tablets by mouth today then  5 tablets tomorrow, then one tablet less each day there after, to take with food #21 no refills. Use OTC Tylenol take as directed for pain. Offered work note for a couple of days if he needs it. He will contact me if he needs it. Return to clinic as needed. Will contact patient with blood work results. Does not want epipen due to cost.

## 2017-01-03 ENCOUNTER — Telehealth: Payer: Self-pay | Admitting: Medical

## 2017-01-03 LAB — COMPREHENSIVE METABOLIC PANEL
ALK PHOS: 92 IU/L (ref 39–117)
ALT: 27 IU/L (ref 0–44)
AST: 28 IU/L (ref 0–40)
Albumin/Globulin Ratio: 1.7 (ref 1.2–2.2)
Albumin: 4.8 g/dL (ref 3.5–5.5)
BUN/Creatinine Ratio: 23 — ABNORMAL HIGH (ref 9–20)
BUN: 20 mg/dL (ref 6–24)
Bilirubin Total: 0.3 mg/dL (ref 0.0–1.2)
CALCIUM: 9.1 mg/dL (ref 8.7–10.2)
CO2: 23 mmol/L (ref 20–29)
CREATININE: 0.87 mg/dL (ref 0.76–1.27)
Chloride: 99 mmol/L (ref 96–106)
GFR calc Af Amer: 122 mL/min/{1.73_m2} (ref >59)
GFR, EST NON AFRICAN AMERICAN: 106 mL/min/{1.73_m2} (ref >59)
GLOBULIN, TOTAL: 2.8 g/dL (ref 1.5–4.5)
GLUCOSE: 96 mg/dL (ref 65–99)
Potassium: 4.7 mmol/L (ref 3.5–5.2)
SODIUM: 138 mmol/L (ref 134–144)
Total Protein: 7.6 g/dL (ref 6.0–8.5)

## 2017-01-03 LAB — CBC WITH DIFFERENTIAL/PLATELET
BASOS ABS: 0 10*3/uL (ref 0.0–0.2)
Basos: 0 %
EOS (ABSOLUTE): 0.2 10*3/uL (ref 0.0–0.4)
EOS: 3 %
Hematocrit: 43.8 % (ref 37.5–51.0)
Hemoglobin: 15 g/dL (ref 13.0–17.7)
IMMATURE GRANULOCYTES: 0 %
Immature Grans (Abs): 0 10*3/uL (ref 0.0–0.1)
LYMPHS ABS: 2.3 10*3/uL (ref 0.7–3.1)
Lymphs: 34 %
MCH: 31.6 pg (ref 26.6–33.0)
MCHC: 34.2 g/dL (ref 31.5–35.7)
MCV: 92 fL (ref 79–97)
MONOS ABS: 0.5 10*3/uL (ref 0.1–0.9)
Monocytes: 7 %
NEUTROS PCT: 56 %
Neutrophils Absolute: 3.7 10*3/uL (ref 1.4–7.0)
PLATELETS: 273 10*3/uL (ref 150–379)
RBC: 4.74 x10E6/uL (ref 4.14–5.80)
RDW: 14.8 % (ref 12.3–15.4)
WBC: 6.7 10*3/uL (ref 3.4–10.8)

## 2017-01-03 LAB — HGB A1C W/O EAG: HEMOGLOBIN A1C: 5.7 % — AB (ref 4.8–5.6)

## 2017-01-03 LAB — URIC ACID: URIC ACID: 4.6 mg/dL (ref 3.7–8.6)

## 2017-01-03 NOTE — Telephone Encounter (Signed)
Called patient and reviewed his labs , Uric acid within normal limits.  Most likely plantar fasciitis. Currently taking prednisone taper, he feels his foot (right) is doing better today. He took off work today to rest the foot.  To return to clinic in 3 -5 days fif not continuing to improve for referral to podiatry. He did have plantar fasciitis in his left food years ago, got a cortisone injection which then resolved his pain. Seen by Dr. Maud Deed Hyatt.

## 2017-01-30 ENCOUNTER — Ambulatory Visit (INDEPENDENT_AMBULATORY_CARE_PROVIDER_SITE_OTHER): Payer: BLUE CROSS/BLUE SHIELD | Admitting: Family Medicine

## 2017-01-30 ENCOUNTER — Encounter: Payer: Self-pay | Admitting: Family Medicine

## 2017-01-30 VITALS — BP 138/82 | HR 84 | Temp 98.8°F | Resp 16 | Wt 259.0 lb

## 2017-01-30 DIAGNOSIS — L237 Allergic contact dermatitis due to plants, except food: Secondary | ICD-10-CM

## 2017-01-30 DIAGNOSIS — G4733 Obstructive sleep apnea (adult) (pediatric): Secondary | ICD-10-CM

## 2017-01-30 DIAGNOSIS — I10 Essential (primary) hypertension: Secondary | ICD-10-CM | POA: Diagnosis not present

## 2017-01-30 DIAGNOSIS — F320 Major depressive disorder, single episode, mild: Secondary | ICD-10-CM | POA: Diagnosis not present

## 2017-01-30 DIAGNOSIS — L255 Unspecified contact dermatitis due to plants, except food: Secondary | ICD-10-CM

## 2017-01-30 NOTE — Progress Notes (Signed)
Patient: [name] [name]. Male    DOB: 11/10/1975   41 y.o.   MRN: [id number] Visit Date: 01/30/2017  Today's Provider: Megan Mansichard Gilbert Jr, MD   Chief Complaint  Patient presents with  . Depression  . Hypertension  . Rash   Subjective:    Rash    Patient comes in office today with concerns of poison ivy/oak exposure to his hands and feet for the the past two weeks. Patient reports that he has been applying calamine lotion with no relief.   Depression Patient returns for three month follow up, last office visit was 5/10//18 and condition was stable. Patient reports good compliance and tolerance on medication.     Hypertension, follow-up:  BP Readings from Last 3 Encounters:  01/30/17 138/82  01/02/17 128/82  10/27/16 124/82    He was last seen for hypertension 3 months ago.  BP at that visit was  124/82. Management changes since that visit include discontinuing Lisinopril 20mg  qd. He reports poor compliance with treatment. He is having side effects, patient reported feeling weak and noticing more frequent drops in his blood pressure readings.  He is exercising. He is adherent to low salt diet.   Outside blood pressures are systolic in 130s and diastolic readings in 80s. He is experiencing fatigue.  Patient denies chest pain, chest pressure/discomfort, claudication, dyspnea, exertional chest pressure/discomfort, irregular heart beat, lower extremity edema, near-syncope, orthopnea, palpitations, paroxysmal nocturnal dyspnea, syncope and tachypnea.   Cardiovascular risk factors include hypertension and male gender.  Use of agents associated with hypertension: none.     Weight trend: decreasing steadily Wt Readings from Last 3 Encounters:  01/30/17 259 lb (117.5 kg)  01/02/17 262 lb 12.8 oz (119.2 kg)  10/27/16 280 lb (127 kg)    Current diet: well balanced  Allergies  Allergen Reactions  . Penicillins Anaphylaxis  . Shellfish Allergy Anaphylaxis  .  Yellow Jacket Venom Shortness Of Breath     Current Outpatient Prescriptions:  .  aspirin EC 81 MG EC tablet, Take 1 tablet (81 mg total) by mouth daily., Disp: 30 tablet, Rfl: 0 .  lithium carbonate 150 MG capsule, Take 1 capsule (150 mg total) by mouth 2 (two) times daily with a meal., Disp: 60 capsule, Rfl: 5 .  sertraline (ZOLOFT) 50 MG tablet, Take 1 tablet (50 mg total) by mouth daily., Disp: 30 tablet, Rfl: 5 .  diazepam (VALIUM) 10 MG tablet, Take 10 mg by mouth every 6 (six) hours as needed for anxiety. , Disp: , Rfl:  .  EPINEPHRINE, ANAPHYLAXIS THERAPY AGENTS,, EPIPEN 2-PAK, 0.3MG /0.3ML (Injection Device)  1 Device as directed for 0 days  Quantity: 1;  Refills: 12   Ordered :05-Mar-2012  DeSanto, Michelle Nasutilena ;  Started 05-Mar-2012 Active, Disp: , Rfl:  .  LORazepam (ATIVAN) 1 MG tablet, 1/2 to 1 tablets three times daily as needed (Patient not taking: Reported on 01/30/2017), Disp: 90 tablet, Rfl: 1 .  LYRICA 150 MG capsule, Take 150 mg by mouth 2 (two) times daily., Disp: , Rfl: 2  Review of Systems  Constitutional: Positive for fatigue. Negative for activity change, appetite change, chills, diaphoresis, fever and unexpected weight change.  HENT: Negative.   Eyes: Negative.   Respiratory: Negative.   Cardiovascular: Negative.   Gastrointestinal: Negative.   Endocrine: Negative.   Genitourinary: Negative.   Musculoskeletal: Positive for back pain. Negative for arthralgias, gait problem, joint swelling, myalgias, neck pain and neck stiffness.  Skin: Positive  for rash. Negative for color change, pallor and wound.  Allergic/Immunologic: Negative.   Neurological: Negative.   Hematological: Negative.   Psychiatric/Behavioral: Negative.     Social History  Substance Use Topics  . Smoking status: Former Smoker    Packs/day: 0.30    Years: 3.00    Types: Cigarettes    Quit date: 08/09/1992  . Smokeless tobacco: Current User    Types: Chew  . Alcohol use 1.8 - 2.4 oz/week    3 - 4  Cans of beer per week     Comment: occasional   Objective:   BP 138/82   Pulse 84   Temp 98.8 F (37.1 C) (Oral)   Resp 16   Wt 259 lb (117.5 kg)   SpO2 94%   BMI 35.13 kg/m  Vitals:   01/30/17 1625  BP: 138/82  Pulse: 84  Resp: 16  Temp: 98.8 F (37.1 C)  TempSrc: Oral  SpO2: 94%  Weight: 259 lb (117.5 kg)     Physical Exam  Constitutional: He is oriented to person, place, and time. He appears well-developed and well-nourished.  HENT:  Head: Normocephalic and atraumatic.  Right Ear: External ear normal.  Left Ear: External ear normal.  Nose: Nose normal.  Eyes: Conjunctivae are normal. No scleral icterus.  Neck: No thyromegaly present.  Cardiovascular: Normal rate, regular rhythm and normal heart sounds.   Pulmonary/Chest: Effort normal and breath sounds normal.  Abdominal: Soft.  Neurological: He is alert and oriented to person, place, and time.  Skin: Skin is warm and dry. Rash noted.  Mild rhus dermatitis on hands ,arms.  Psychiatric: He has a normal mood and affect. His behavior is normal. Judgment and thought content normal.        Assessment & Plan:     Rhus Dermatisis Rx if it gets worse--mild now. Obesity Pt has lost more than 50 lbs with diet and some exercise. MDD--in Remission D/c Lithium. HTN  I have done the exam and reviewed the chart and it is accurate to the best of my knowledge. Dentist has been used and  any errors in dictation or transcription are unintentional. Julieanne Manson M.D. [facility]       [facility]

## 2017-01-30 NOTE — Patient Instructions (Signed)
Appt scheduled for 06/01/17 at 4PM.

## 2017-03-02 ENCOUNTER — Ambulatory Visit (INDEPENDENT_AMBULATORY_CARE_PROVIDER_SITE_OTHER): Payer: BLUE CROSS/BLUE SHIELD | Admitting: Family Medicine

## 2017-03-02 VITALS — BP 122/90 | HR 80 | Temp 98.9°F | Resp 18 | Wt 263.0 lb

## 2017-03-02 DIAGNOSIS — L03213 Periorbital cellulitis: Secondary | ICD-10-CM

## 2017-03-02 DIAGNOSIS — H1031 Unspecified acute conjunctivitis, right eye: Secondary | ICD-10-CM

## 2017-03-02 MED ORDER — LEVOFLOXACIN 500 MG PO TABS: 500.0000 mg | ORAL_TABLET | Freq: Every day | ORAL | 1 refills | Status: DC

## 2017-03-02 MED ORDER — CIPROFLOXACIN HCL 0.3 % OP SOLN: 2.0000 [drp] | OPHTHALMIC | 0 refills | Status: DC

## 2017-03-02 NOTE — Progress Notes (Signed)
[name] [name].  MRN: [id number] DOB: March 02, 1976  Subjective:  HPI  Patient states that yesterday -03/01/17- at lunch time his right eye started to itch really bad and then steadily got worse and this morning right eye pain/burning/itching present, blurred vision, right side of the head is hurting, right eye swollen, red, and draining mainly clear drainage. Runny nose is present but no fever that he knows of and no chills. He tried red eye drops yesterday and took Benadryl this morning and this dis not help. No sting/bite that he knows of. Has not been around any chemicals that he knows of.  Patient Active Problem List   Diagnosis Date Noted  . Seasonal affective disorder (HCC) 06/27/2016  . Anxiety and depression 12/27/2014  . Allergic rhinitis 12/27/2014  . Depression, major, single episode, mild (HCC) 12/27/2014  . Essential (primary) hypertension 12/27/2014  . Esophageal reflux 12/27/2014  . 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 Apnea, sleep 12/27/2014  . Avitaminosis D 12/27/2014  . Chest pain, central 12/23/2014    Past Medical History:  Diagnosis Date  . Depressed   . Depression   . GERD (gastroesophageal reflux disease)   . Headache(784.0)   . Hypertension   . Sleep apnea    cpap    Social History   Social History  . Marital status: Married    Spouse name: N/A  . Number of children: N/A  . Years of education: N/A   Occupational History  . Not on file.   Social History Main Topics  . Smoking status: Former Smoker    Packs/day: 0.30    Years: 3.00    Types: Cigarettes    Quit date: 08/09/1992  . Smokeless tobacco: Current User    Types: Chew  . Alcohol use 1.8 - 2.4 oz/week    3 - 4 Cans of beer per week     Comment: occasional  . Drug use: No  . Sexual activity: Yes   Other Topics Concern  . Not on file   Social History Narrative  . No narrative on file    Outpatient Encounter Prescriptions as of 03/02/2017    Medication Sig Note  . aspirin EC 81 MG EC tablet Take 1 tablet (81 mg total) by mouth daily.   . diazepam (VALIUM) 10 MG tablet Take 10 mg by mouth every 6 (six) hours as needed for anxiety.    Marland Kitchen EPINEPHRINE, ANAPHYLAXIS THERAPY AGENTS, EPIPEN 2-PAK, 0.3MG /0.3ML (Injection Device)  1 Device as directed for 0 days  Quantity: 1;  Refills: 12   Ordered :05-Mar-2012  Janey Greaser ;  Started 05-Mar-2012 Active 12/27/2014: Received from: [facility]  . lithium carbonate 150 MG capsule Take 1 capsule (150 mg total) by mouth 2 (two) times daily with a meal.   . LYRICA 150 MG capsule Take 150 mg by mouth 2 (two) times daily. 08/31/2015: Received from: External Pharmacy  . sertraline (ZOLOFT) 50 MG tablet Take 1 tablet (50 mg total) by mouth daily.   Marland Kitchen LORazepam (ATIVAN) 1 MG tablet 1/2 to 1 tablets three times daily as needed (Patient not taking: Reported on 03/02/2017)    No facility-administered encounter medications on file as of 03/02/2017.     Allergies  Allergen Reactions  . Penicillins Anaphylaxis  . Shellfish Allergy Anaphylaxis  . Yellow Jacket Venom Shortness Of Breath    Review of Systems  Constitutional: Negative.   HENT: Negative.        Runny nose  Eyes: Positive for blurred vision, pain, discharge and redness.  Respiratory: Negative.   Cardiovascular: Negative.   Musculoskeletal: Negative.   Neurological: Positive for headaches.    Objective:  BP 122/90   Pulse 80   Temp 98.9 F (37.2 C)   Resp 18   Wt 263 lb (119.3 kg)   BMI 35.67 kg/m   Physical Exam  Constitutional: He is well-developed, well-nourished, and in no distress.  HENT:  Head: Normocephalic and atraumatic.  Eyes: Pupils are equal, round, and reactive to light.  conjuctavus injected right eye, swelling and tenderness around upper and lower right eye lid.  Cardiovascular: Normal rate, regular rhythm, normal heart sounds and intact distal pulses.  Exam reveals no gallop.   No  murmur heard. Pulmonary/Chest: Effort normal and breath sounds normal. No respiratory distress. He has no wheezes.   Assessment and Plan :  1. Periorbital cellulitis of right eye Treat as a bacterial infection. Will also refer to [facility] for tomorrow 03/03/17 if possible. - levofloxacin (LEVAQUIN) 500 MG tablet; Take 1 tablet (500 mg total) by mouth daily.  Dispense: 5 tablet; Refill: 1 - Ambulatory referral to Ophthalmology - ciprofloxacin (CILOXAN) 0.3 % ophthalmic solution; Place 2 drops into the right eye every 4 (four) hours while awake.  Dispense: 5 mL; Refill: 0 Pt allergic to PCN. 2. Acute bacterial conjunctivitis of right eye - levofloxacin (LEVAQUIN) 500 MG tablet; Take 1 tablet (500 mg total) by mouth daily.  Dispense: 5 tablet; Refill: 1 - Ambulatory referral to Ophthalmology - ciprofloxacin (CILOXAN) 0.3 % ophthalmic solution; Place 2 drops into the right eye every 4 (four) hours while awake.  Dispense: 5 mL; Refill: 0  HPI, Exam and A&P transcribed by Domingo CockingAnastasiya Hopkins, RMA under direction and in the presence of Julieanne Mansonichard Annastasia Haskins, MD. I have done the exam and reviewed the chart and it is accurate to the best of my knowledge. Dentist[employer]agon  technology has been used and  any errors in dictation or transcription are unintentional. Julieanne Mansonichard Boyde Grieco M.D. [facility]

## 2017-03-27 ENCOUNTER — Other Ambulatory Visit: Payer: Self-pay | Admitting: Family Medicine

## 2017-04-12 ENCOUNTER — Other Ambulatory Visit: Payer: Self-pay | Admitting: Family Medicine

## 2017-04-17 ENCOUNTER — Encounter: Payer: Self-pay | Admitting: Podiatry

## 2017-04-17 ENCOUNTER — Ambulatory Visit (INDEPENDENT_AMBULATORY_CARE_PROVIDER_SITE_OTHER): Payer: BLUE CROSS/BLUE SHIELD | Admitting: Podiatry

## 2017-04-17 ENCOUNTER — Ambulatory Visit (INDEPENDENT_AMBULATORY_CARE_PROVIDER_SITE_OTHER): Payer: BLUE CROSS/BLUE SHIELD

## 2017-04-17 VITALS — BP 150/95 | HR 77 | Temp 98.0°F | Resp 16

## 2017-04-17 DIAGNOSIS — M84374A Stress fracture, right foot, initial encounter for fracture: Secondary | ICD-10-CM

## 2017-04-17 NOTE — Progress Notes (Signed)
   Subjective:    Patient ID: [name] Bowlaymond E Cantera Jr., male    DOB: December 09, 1975, 41 y.o.   MRN: [id number]  HPI this patient presents the office with chief complaint severe pain noted on the top of his right foot.  He says the pain has been so severe over the weekend it has even drawn 2 tears.  He says when he walks, its like driving a nail through his foot.  He says he has been experiencing pain in the right foot. on and off and has [facility] [employer] [employer].  He says he has been increasing his activity level over the last 6 months and has lost over 60 pounds by walking. He states he has been taking ibuprofen as well as prednisone for his foot and has also received a brace.  He says the foot has continued to worsen until this weekend when it became severe.  He presents the office today for an evaluation and treatment of this right foot.    Review of Systems  Musculoskeletal: Positive for arthralgias and back pain.  Allergic/Immunologic: Positive for food allergies.  All other systems reviewed and are negative.      Objective:   Physical Exam General Appearance  Alert, conversant and in no acute stress.  Vascular  Dorsalis pedis and posterior pulses are palpable  bilaterally.  Capillary return is within normal limits  Bilaterally. Temperature is within normal limits  Bilaterally.  Hair present on feet.  Neurologic  Senn-Weinstein monofilament wire test within normal limits  bilaterally. Muscle power  Within normal limits bilaterally.  Nails Normal nails tith no evidence of bacterial or fungal infection.  Orthopedic  No limitations of motion of motion feet bilaterally.  No crepitus or effusions noted.  No bony pathology or digital deformities noted. Severe palpable pain third metatarsal right foot.  Swelling noted under metatarsal heads.  No redness or swelling noted dorsally.  Skin  normotropic skin with no porokeratosis noted bilaterally.  No signs of infections or ulcers noted.            Assessment & Plan:  Closed metatarsal fracture right foot. Stress fracture third metatarsal right.  Stress reaction, third metatarsal right  IE  Xrays reveal cortical break bone lateral midshaft third metatarsal right foot.Patient was dispensed a cam walker for him to walk with.  Patient to call the office for any problems occur.  Return to clinic in 3 weeks for follow-up x-ray and further evaluation of his foot   Helane GuntherGregory Siddhanth Denk DPM

## 2017-05-08 ENCOUNTER — Telehealth: Payer: Self-pay | Admitting: Family Medicine

## 2017-05-08 ENCOUNTER — Ambulatory Visit (INDEPENDENT_AMBULATORY_CARE_PROVIDER_SITE_OTHER): Payer: BLUE CROSS/BLUE SHIELD

## 2017-05-08 ENCOUNTER — Ambulatory Visit: Payer: BLUE CROSS/BLUE SHIELD | Admitting: Podiatry

## 2017-05-08 ENCOUNTER — Encounter: Payer: Self-pay | Admitting: Podiatry

## 2017-05-08 DIAGNOSIS — M84374A Stress fracture, right foot, initial encounter for fracture: Secondary | ICD-10-CM

## 2017-05-08 DIAGNOSIS — M84374D Stress fracture, right foot, subsequent encounter for fracture with routine healing: Secondary | ICD-10-CM

## 2017-05-08 NOTE — Telephone Encounter (Signed)
[facility] [address]  faxed refill request for following medications sertraline (ZOLOFT) 50 MG tablet    Please advise,Thanks Leola

## 2017-05-08 NOTE — Progress Notes (Signed)
This patient presents to the office  for continued evaluation of his painful right foot.  He was diagnosed as having a stress fracture third metatarsal right foot last visit and treated with a Cam Walker.  He says he is not having any pain or discomfort when he walks, but he does experience throbbing pain noted at night, especially when he goes to sleep.  He says that he feels pressure in his heel walking with the Cam Walker.  He presents the office today for continued evaluation and treatment of his feet.  General Appearance  Alert, conversant and in no acute stress.  Vascular  Dorsalis pedis and posterior pulses are palpable  bilaterally.  Capillary return is within normal limits  Bilaterally. Temperature is within normal limits  Bilaterally  Neurologic  Senn-Weinstein monofilament wire test within normal limits  bilaterally. Muscle power  Within normal limits bilaterally.  Nails Thick disfigured discolored nails with subungual debride bilaterally from hallux to fifth toes bilaterally. No evidence of bacterial infection or drainage bilaterally.  Orthopedic  No limitations of motion of motion feet bilaterally.  No crepitus or effusions noted.  No bony pathology or digital deformities noted. Mild palpable pain noted midshaft third metatarsal right foot.  No evidence  noted on the dorsum of the third metatarsal right foot.  Skin  normotropic skin with no porokeratosis noted bilaterally.  No signs of infections or ulcers noted.    Status post metatarsal fracture third metatarsal right.  Return office visit.  Examination of his foot and his x-rays reveal healing noted at the painful area.  He was dispensed an anklet as well as a surgical shoe to wear.  He was instructed to wear his Cam Walker at work and to use his surgical shoe Out of work until the pain subsides.  He was also told to take ibuprofen after work and prior to sleep.  Return to clinic in 4 weeks for further evaluation and  treatment.   Helane GuntherGregory Sadee Osland DPM

## 2017-05-08 NOTE — Telephone Encounter (Signed)
Med was sent in on 03/27/2017 with 5 refills.

## 2017-05-10 ENCOUNTER — Other Ambulatory Visit: Payer: Self-pay | Admitting: Family Medicine

## 2017-05-10 MED ORDER — SERTRALINE HCL 50 MG PO TABS: 50.0000 mg | ORAL_TABLET | Freq: Every day | ORAL | 1 refills | Status: DC

## 2017-05-10 NOTE — Telephone Encounter (Signed)
[facility] faxed a request for a 90-days supply for the following medication. Thanks CC  sertraline (ZOLOFT) 50 MG tablet

## 2017-05-29 ENCOUNTER — Ambulatory Visit: Payer: BLUE CROSS/BLUE SHIELD | Admitting: Podiatry

## 2017-06-01 ENCOUNTER — Ambulatory Visit: Payer: Self-pay | Admitting: Family Medicine

## 2017-06-08 ENCOUNTER — Ambulatory Visit: Payer: BLUE CROSS/BLUE SHIELD | Admitting: Podiatry

## 2017-06-08 ENCOUNTER — Ambulatory Visit: Payer: Self-pay

## 2017-06-08 ENCOUNTER — Encounter: Payer: Self-pay | Admitting: Podiatry

## 2017-06-08 DIAGNOSIS — S93601D Unspecified sprain of right foot, subsequent encounter: Secondary | ICD-10-CM | POA: Diagnosis not present

## 2017-06-08 DIAGNOSIS — R52 Pain, unspecified: Secondary | ICD-10-CM

## 2017-06-08 DIAGNOSIS — M84374A Stress fracture, right foot, initial encounter for fracture: Secondary | ICD-10-CM | POA: Diagnosis not present

## 2017-06-08 NOTE — Progress Notes (Signed)
This patient returns to the office for continued evaluation of his painful right foot.  He says that his foot had  not  been painful for  time. following him walking with his cam walker.  He says that approximately 3 days ago the pain in his right foot became extremely and painful and has continued to be painful at this visit.  He has significant pain and discomfort walking and wearing his shoes.  He says the throbbing is very severe and presents the office today for continued evaluation and treatment of his right foot.   General Appearance  Alert, conversant and in no acute stress.  Vascular  Dorsalis pedis and posterior pulses are palpable  bilaterally.  Capillary return is within normal limits  Bilaterally. Temperature is within normal limits  Bilaterally  Neurologic  Senn-Weinstein monofilament wire test within normal limits  bilaterally. Muscle power  Within normal limits bilaterally.  Nails Thick disfigured discolored nails with subungual debride bilaterally from hallux to fifth toes bilaterally. No evidence of bacterial infection or drainage bilaterally.  Orthopedic  No limitations of motion of motion feet bilaterally.  No crepitus or effusions noted.  Significant palpable pain noted to the second, third and fourth metatarsals on the right foot.  Minimal redness and swelling noted.  Skin  normotropic skin with no porokeratosis noted bilaterally.  No signs of infections or ulcers noted.    Foot Sprain right.     ROV  After examination of this patient his right foot had an x-ray taken.  There were no changes to the bone of the right forefoot compared to the previous x-rays.  This patient presents the office with seemingly pain out of proportion to what is seen clinically.  He does not give a history of any trauma which could cause an RSD to occur.  Want to acquire an MRI of his right foot He also had [place] which she says have gone away  since his lost 50 pounds.  Therefore, I told this patient. I want to order an MRI right foot.  He was told to return to his Cam Walker and to make an appointment with this office once the MRI has been taken. Patient was not interested in any pain medicines.   Helane GuntherGregory Rieley Khalsa DPM

## 2017-06-12 ENCOUNTER — Ambulatory Visit
Admission: RE | Admit: 2017-06-12 | Discharge: 2017-06-12 | Disposition: A | Discharge status: Home / Self Care | Payer: BLUE CROSS/BLUE SHIELD | Source: Ambulatory Visit | Attending: Podiatry | Admitting: Podiatry

## 2017-06-12 DIAGNOSIS — M7989 Other specified soft tissue disorders: Secondary | ICD-10-CM | POA: Diagnosis not present

## 2017-06-12 DIAGNOSIS — M25474 Effusion, right foot: Secondary | ICD-10-CM | POA: Diagnosis not present

## 2017-06-12 DIAGNOSIS — X58XXXA Exposure to other specified factors, initial encounter: Secondary | ICD-10-CM | POA: Diagnosis not present

## 2017-06-12 DIAGNOSIS — M84374A Stress fracture, right foot, initial encounter for fracture: Secondary | ICD-10-CM

## 2017-06-16 ENCOUNTER — Telehealth: Payer: Self-pay | Admitting: *Deleted

## 2017-06-16 NOTE — Telephone Encounter (Signed)
Pt states had MRI on Christmas Eve and saw results were in on Christmas, and wanted to know if results had been reviewed. I told pt I would have someone contact him for an appt to discuss the results.

## 2017-06-22 ENCOUNTER — Ambulatory Visit: Payer: BLUE CROSS/BLUE SHIELD | Admitting: Podiatry

## 2017-06-22 ENCOUNTER — Encounter: Payer: Self-pay | Admitting: Podiatry

## 2017-06-22 ENCOUNTER — Telehealth: Payer: Self-pay | Admitting: *Deleted

## 2017-06-22 DIAGNOSIS — S93601D Unspecified sprain of right foot, subsequent encounter: Secondary | ICD-10-CM

## 2017-06-22 MED ORDER — METHYLPREDNISOLONE 4 MG PO TABS: ORAL_TABLET | ORAL | 0 refills | Status: DC

## 2017-06-22 NOTE — Progress Notes (Signed)
This patient returns to the office follow-up for pain in his right foot.  He relates pain is still present on the bottom ball of his right foot.  He also says he has developed a cyst on the top of the outside of his right foot.  E was seen last on December 20 at which time he was told to ambulate with the Cam Walker and an MRI of his right foot was performed. The MRI did reveal a probable ganglion cyst noted in his right dorsal lateral aspect of the right foot.  The MRI also said there was no evidence of a stress fracture on his right forefoot.  The MRI did reveal microscopic metallic particles noted on the plantar aspect of the cutaneous tissues of the foot.  This patient states he still is in severe pain and discomfort especially when he tries to sleep at night.  He presents the office today for continued evaluation and treatment of his right foot.  He says he is not been able to wear his Cam Walker due to hip aggravation.  He presents the office today for an evaluation and treatment of right foot.  General Appearance  Alert, conversant and in no acute stress.  Vascular  Dorsalis pedis and posterior pulses are palpable  bilaterally.  Capillary return is within normal limits  bilaterally. Temperature is within normal limits  Bilaterally.  Neurologic  Senn-Weinstein monofilament wire test within normal limits  bilaterally. Muscle power within normal limits bilaterally.  Nails normal nails noted with no evidence of bacterial or fungal infection.  Orthopedic  No limitations of motion of motion feet bilaterally.  No crepitus or effusions noted.  No bony pathology or digital deformities noted. Palpable pain noted through the second, third and fourth metatarsals extending to the MPJ of all these metatarsals.  Minimal redness and swelling noted.  Patient does have a very prominent ganglion on noted on the dorsal lateral aspect of his right foot.  There is localized swelling under the second, third and fourth MPJ  plantarly.  Skin  normotropic skin with no porokeratosis noted bilaterally.  No signs of infections or ulcers noted.    Foot Sprain    ROV.  Discussed the findings of the MRI with this patient.  Patient was told about the microscopic medical that is present in his foot.  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  After this conversation, I decided to have the [facility].  In the meantime, I prescribed him a Medrol Dosepak and told him to return to the office in one week for continued evaluation of his foot.   Helane GuntherGregory Easton Fetty DPM

## 2017-06-22 NOTE — Telephone Encounter (Signed)
[name] [name]. Venable asked if I would order MRI disc copy to be [facility] to [facility]. [facility].

## 2017-06-27 NOTE — Telephone Encounter (Signed)
Mailed copy of MRI disc to [facility]. 

## 2017-06-29 ENCOUNTER — Ambulatory Visit: Payer: BLUE CROSS/BLUE SHIELD | Admitting: Podiatry

## 2017-07-05 ENCOUNTER — Encounter: Payer: Self-pay | Admitting: Podiatry

## 2017-07-14 ENCOUNTER — Telehealth: Payer: Self-pay | Admitting: Podiatry

## 2017-07-14 NOTE — Telephone Encounter (Signed)
I informed pt of Dr. Stacie AcresMayer review of results and recommendations. Pt states understanding and would like copy of the over read that was not viewable on MyChart.

## 2017-07-14 NOTE — Telephone Encounter (Signed)
Dr. Stacie AcresMayer states the MRI over read does not show abnormal findings, pt may want to be referred to Dr. Logan BoresEvans in our office or possibly a neurology.

## 2017-07-14 NOTE — Telephone Encounter (Signed)
Mailed copy of [facility] report to pt's new home address.

## 2017-07-25 DIAGNOSIS — D481 Neoplasm of uncertain behavior of connective and other soft tissue: Secondary | ICD-10-CM | POA: Diagnosis not present

## 2017-07-25 DIAGNOSIS — M79671 Pain in right foot: Secondary | ICD-10-CM | POA: Diagnosis not present

## 2017-07-25 DIAGNOSIS — G5761 Lesion of plantar nerve, right lower limb: Secondary | ICD-10-CM | POA: Diagnosis not present

## 2017-07-25 DIAGNOSIS — R2241 Localized swelling, mass and lump, right lower limb: Secondary | ICD-10-CM | POA: Diagnosis not present

## 2017-11-30 DIAGNOSIS — M67479 Ganglion, unspecified ankle and foot: Secondary | ICD-10-CM | POA: Diagnosis not present

## 2018-04-10 ENCOUNTER — Encounter: Payer: Self-pay | Admitting: Family Medicine

## 2018-04-10 ENCOUNTER — Ambulatory Visit: Payer: BLUE CROSS/BLUE SHIELD | Admitting: Family Medicine

## 2018-04-10 VITALS — BP 126/86 | HR 88 | Temp 98.5°F | Resp 16 | Wt 282.0 lb

## 2018-04-10 DIAGNOSIS — F320 Major depressive disorder, single episode, mild: Secondary | ICD-10-CM | POA: Diagnosis not present

## 2018-04-10 DIAGNOSIS — F419 Anxiety disorder, unspecified: Secondary | ICD-10-CM | POA: Diagnosis not present

## 2018-04-10 DIAGNOSIS — F329 Major depressive disorder, single episode, unspecified: Secondary | ICD-10-CM

## 2018-04-10 DIAGNOSIS — I10 Essential (primary) hypertension: Secondary | ICD-10-CM

## 2018-04-10 DIAGNOSIS — Z23 Encounter for immunization: Secondary | ICD-10-CM

## 2018-04-10 DIAGNOSIS — G4733 Obstructive sleep apnea (adult) (pediatric): Secondary | ICD-10-CM

## 2018-04-10 DIAGNOSIS — F32A Depression, unspecified: Secondary | ICD-10-CM

## 2018-04-10 MED ORDER — SERTRALINE HCL 100 MG PO TABS: 100.0000 mg | ORAL_TABLET | Freq: Every day | ORAL | 3 refills | Status: DC

## 2018-04-10 NOTE — Progress Notes (Signed)
Patient: [name] [name]. Male    DOB: 1975-07-08   42 y.o.   MRN: [id number] Visit Date: 04/10/2018  Today's Provider: [name], MD   Chief Complaint  Patient presents with  . Apnea    History of Sleep Apnea   Subjective:    HPI   Fatigue: Patient complains of fatigue. Symptoms began about a month ago. Sentinal symptom the patient feels fatigue began with: none. Symptoms of his fatigue have been anxiousness, feelings of depression, general malaise and lack of interest in usual activities. Patient describes the following psychologic symptoms: depression.  Patient denies unusual rashes, cold intolerance, constipation and change in hair texture. and GI blood loss. Symptoms have gradually worsened. Severity has been symptoms bothersome, but easily able to carry out all usual work/school/family activities. Previous visits for this problem: are in chart.  He has OSA.Marland Kitchen He wears his CPAP. He takes his meds.       Allergies  Allergen Reactions  . Penicillins Anaphylaxis  . Shellfish Allergy Anaphylaxis  . Yellow Jacket Venom Shortness Of Breath     Current Outpatient Medications:  .  sertraline (ZOLOFT) 50 MG tablet, Take 1 tablet (50 mg total) by mouth daily., Disp: 90 tablet, Rfl: 1 .  ciprofloxacin (CILOXAN) 0.3 % ophthalmic solution, PLACE 2 DROPS INTO THE RIGHT EYE EVERY 4 (FOUR) HOURS WHILE AWAKE., Disp: , Rfl: 0 .  diazepam (VALIUM) 10 MG tablet, Take 10 mg by mouth every 6 (six) hours as needed for anxiety., Disp: , Rfl:  .  EPINEPHRINE, ANAPHYLAXIS THERAPY AGENTS,, EPIPEN 2-PAK, 0.3MG /0.3ML (Injection Device)  1 Device as directed for 0 days  Quantity: 1;  Refills: 12   Ordered :05-Mar-2012  Janey Greaser ;  Started 05-Mar-2012 Active, Disp: , Rfl:  .  methylPREDNISolone (MEDROL) 4 MG tablet, Take as directed (Patient not taking: Reported on 04/10/2018), Disp: 21 tablet, Rfl: 0  Review of Systems  Constitutional: Positive for fatigue. Negative for  activity change, appetite change, chills, diaphoresis, fever and unexpected weight change.  HENT: Negative.   Eyes: Negative.   Respiratory: Positive for apnea. Negative for cough, choking, chest tightness, shortness of breath, wheezing and stridor.   Cardiovascular: Negative.   Gastrointestinal: Negative.   Endocrine: Negative.   Genitourinary: Negative.   Allergic/Immunologic: Negative.   Psychiatric/Behavioral: Positive for agitation, dysphoric mood and sleep disturbance. Negative for behavioral problems, confusion, decreased concentration, hallucinations, self-injury and suicidal ideas. The patient is nervous/anxious. The patient is not hyperactive.     Social History   Tobacco Use  . Smoking status: Former Smoker    Packs/day: 0.30    Years: 3.00    Pack years: 0.90    Types: Cigarettes    Last attempt to quit: 08/09/1992    Years since quitting: 25.6  . Smokeless tobacco: Current User    Types: Chew  Substance Use Topics  . Alcohol use: Yes    Alcohol/week: 3.0 - 4.0 standard drinks    Types: 3 - 4 Cans of beer per week    Comment: occasional   Objective:   BP 126/86 (BP Location: Left Arm, Patient Position: Sitting, Cuff Size: Large)   Pulse 88   Temp 98.5 F (36.9 C) (Oral)   Resp 16   Wt 282 lb (127.9 kg)   BMI 38.25 kg/m  Vitals:   04/10/18 1622  BP: 126/86  Pulse: 88  Resp: 16  Temp: 98.5 F (36.9 C)  TempSrc: Oral  Weight: 282  lb (127.9 kg)     Physical Exam  Constitutional: He is oriented to person, place, and time. He appears well-developed and well-nourished.  HENT:  Head: Normocephalic and atraumatic.  Right Ear: External ear normal.  Left Ear: External ear normal.  Nose: Nose normal.  Eyes: Conjunctivae are normal. No scleral icterus.  Neck: No thyromegaly present.  Cardiovascular: Normal rate, regular rhythm and normal heart sounds.  Pulmonary/Chest: Effort normal and breath sounds normal.  Abdominal: Soft.  Musculoskeletal: He exhibits  no edema.  Neurological: He is alert and oriented to person, place, and time.  Skin: Skin is warm and dry.  Psychiatric: He has a normal mood and affect. His behavior is normal. Judgment and thought content normal.        Assessment & Plan:     1. Need for influenza vaccination  - Flu Vaccine QUAD 6+ mos PF IM (Fluarix Quad PF)  2. Essential (primary) hypertension  - CBC with Differential/Platelet - Comprehensive metabolic panel - TSH - Lipid panel  3. Obstructive sleep apnea syndrome Using CPAP.  4. Anxiety and depression  - CBC with Differential/Platelet - TSH - sertraline (ZOLOFT) 100 MG tablet; Take 1 tablet (100 mg total) by mouth daily.  Dispense: 30 tablet; Refill: 3 - diazepam (VALIUM) 5 MG tablet; Take 1 tablet (5 mg total) by mouth every 12 (twelve) hours as needed for anxiety.  Dispense: 30 tablet; Refill: 1  5. Depression, major, single episode, mild (HCC) Increase Zoloft from 50 to 100mg  . - TSH - sertraline (ZOLOFT) 100 MG tablet; Take 1 tablet (100 mg total) by mouth daily.  Dispense: 30 tablet; Refill: 3  I have done the exam and reviewed the chart and it is accurate to the best of my knowledge. Dentist has been used and  any errors in dictation or transcription are unintentional. Julieanne Manson M.D. [facility]       [name], MD  [facility]

## 2018-04-11 ENCOUNTER — Telehealth: Payer: Self-pay | Admitting: Family Medicine

## 2018-04-11 NOTE — Telephone Encounter (Signed)
Called saying he was in yesterday and thought Dr. Sullivan Lone was going to send to medications to the pharmacy.  He got the Zoloft but the other medication to help him to sleep and for anxiety was not sent in.  To take prn  Pt's CB#  [phone]  [facility]  [address]  Thanks teri

## 2018-04-12 MED ORDER — DIAZEPAM 5 MG PO TABS: 5.0000 mg | ORAL_TABLET | Freq: Two times a day (BID) | ORAL | 1 refills | Status: DC | PRN

## 2018-04-12 NOTE — Telephone Encounter (Signed)
Done!  signed

## 2018-05-02 ENCOUNTER — Other Ambulatory Visit: Payer: Self-pay | Admitting: Family Medicine

## 2018-05-02 DIAGNOSIS — F419 Anxiety disorder, unspecified: Principal | ICD-10-CM

## 2018-05-02 DIAGNOSIS — F32A Depression, unspecified: Secondary | ICD-10-CM

## 2018-05-02 DIAGNOSIS — F329 Major depressive disorder, single episode, unspecified: Secondary | ICD-10-CM

## 2018-05-02 DIAGNOSIS — F320 Major depressive disorder, single episode, mild: Secondary | ICD-10-CM

## 2018-05-09 ENCOUNTER — Other Ambulatory Visit: Payer: Self-pay

## 2018-05-09 DIAGNOSIS — I10 Essential (primary) hypertension: Secondary | ICD-10-CM

## 2018-05-10 ENCOUNTER — Other Ambulatory Visit: Payer: BLUE CROSS/BLUE SHIELD

## 2018-05-10 DIAGNOSIS — I10 Essential (primary) hypertension: Secondary | ICD-10-CM

## 2018-05-11 LAB — LIPID PANEL
CHOLESTEROL TOTAL: 215 mg/dL — AB (ref 100–199)
Chol/HDL Ratio: 4.2 ratio (ref 0.0–5.0)
HDL: 51 mg/dL (ref >39)
LDL Calculated: 145 mg/dL — ABNORMAL HIGH (ref 0–99)
TRIGLYCERIDES: 97 mg/dL (ref 0–149)
VLDL Cholesterol Cal: 19 mg/dL (ref 5–40)

## 2018-05-11 LAB — CBC WITH DIFFERENTIAL/PLATELET
Basophils Absolute: 0.1 10*3/uL (ref 0.0–0.2)
Basos: 1 %
EOS (ABSOLUTE): 0.2 10*3/uL (ref 0.0–0.4)
EOS: 3 %
HEMATOCRIT: 44.4 % (ref 37.5–51.0)
HEMOGLOBIN: 14.9 g/dL (ref 13.0–17.7)
Immature Grans (Abs): 0 10*3/uL (ref 0.0–0.1)
Immature Granulocytes: 0 %
Lymphocytes Absolute: 2.1 10*3/uL (ref 0.7–3.1)
Lymphs: 32 %
MCH: 30.2 pg (ref 26.6–33.0)
MCHC: 33.6 g/dL (ref 31.5–35.7)
MCV: 90 fL (ref 79–97)
MONOCYTES: 9 %
Monocytes Absolute: 0.6 10*3/uL (ref 0.1–0.9)
NEUTROS ABS: 3.6 10*3/uL (ref 1.4–7.0)
Neutrophils: 55 %
Platelets: 303 10*3/uL (ref 150–450)
RBC: 4.93 x10E6/uL (ref 4.14–5.80)
RDW: 13.4 % (ref 12.3–15.4)
WBC: 6.5 10*3/uL (ref 3.4–10.8)

## 2018-05-11 LAB — COMPREHENSIVE METABOLIC PANEL
ALBUMIN: 4.4 g/dL (ref 3.5–5.5)
ALT: 30 IU/L (ref 0–44)
AST: 24 IU/L (ref 0–40)
Albumin/Globulin Ratio: 1.4 (ref 1.2–2.2)
Alkaline Phosphatase: 80 IU/L (ref 39–117)
BUN / CREAT RATIO: 20 (ref 9–20)
BUN: 21 mg/dL (ref 6–24)
Bilirubin Total: 0.4 mg/dL (ref 0.0–1.2)
CO2: 21 mmol/L (ref 20–29)
Calcium: 9.6 mg/dL (ref 8.7–10.2)
Chloride: 104 mmol/L (ref 96–106)
Creatinine, Ser: 1.06 mg/dL (ref 0.76–1.27)
GFR calc non Af Amer: 85 mL/min/{1.73_m2} (ref >59)
GFR, EST AFRICAN AMERICAN: 98 mL/min/{1.73_m2} (ref >59)
GLOBULIN, TOTAL: 3.1 g/dL (ref 1.5–4.5)
Glucose: 113 mg/dL — ABNORMAL HIGH (ref 65–99)
Potassium: 4.7 mmol/L (ref 3.5–5.2)
Sodium: 141 mmol/L (ref 134–144)
TOTAL PROTEIN: 7.5 g/dL (ref 6.0–8.5)

## 2018-05-11 LAB — TSH: TSH: 0.93 u[IU]/mL (ref 0.450–4.500)

## 2018-05-21 ENCOUNTER — Ambulatory Visit: Payer: BLUE CROSS/BLUE SHIELD | Admitting: Family Medicine

## 2018-05-22 ENCOUNTER — Telehealth: Payer: Self-pay | Admitting: Family Medicine

## 2018-05-22 DIAGNOSIS — G4733 Obstructive sleep apnea (adult) (pediatric): Secondary | ICD-10-CM

## 2018-05-22 NOTE — Telephone Encounter (Signed)
Pt got yesterday's appointment mixed up.  He missed it thinking it was today.  He wanted to let Dr. Sullivan LoneGilbert know he is doing good with the medication increase.  He is still interested in the sleep study that was discussed at last visit.  Please advise.  Thanks, [employer]

## 2018-05-22 NOTE — Telephone Encounter (Signed)
Please review. Thanks!  

## 2018-05-22 NOTE — Telephone Encounter (Signed)
°  Opened in error - [facility] °

## 2018-05-23 NOTE — Telephone Encounter (Signed)
Sleep study referral was placed.

## 2018-05-23 NOTE — Telephone Encounter (Signed)
Please make sure sleep study order is in the chart.  Thank you

## 2018-06-19 ENCOUNTER — Ambulatory Visit: Payer: BLUE CROSS/BLUE SHIELD | Admitting: Adult Health

## 2018-06-19 ENCOUNTER — Encounter: Payer: Self-pay | Admitting: Adult Health

## 2018-06-19 VITALS — BP 133/80 | HR 77 | Temp 98.2°F | Wt 281.0 lb

## 2018-06-19 DIAGNOSIS — R6889 Other general symptoms and signs: Secondary | ICD-10-CM

## 2018-06-19 DIAGNOSIS — H6502 Acute serous otitis media, left ear: Secondary | ICD-10-CM

## 2018-06-19 DIAGNOSIS — J069 Acute upper respiratory infection, unspecified: Secondary | ICD-10-CM

## 2018-06-19 LAB — POCT INFLUENZA A/B
Influenza A, POC: NEGATIVE
Influenza B, POC: NEGATIVE

## 2018-06-19 MED ORDER — AZITHROMYCIN 250 MG PO TABS: ORAL_TABLET | ORAL | 0 refills | Status: DC

## 2018-06-19 NOTE — Patient Instructions (Signed)
Azithromycin tablets What is this medicine? AZITHROMYCIN (az ith roe MYE sin) is a macrolide antibiotic. It is used to treat or prevent certain kinds of bacterial infections. It will not work for colds, flu, or other viral infections. This medicine may be used for other purposes; ask your health care provider or pharmacist if you have questions. COMMON BRAND NAME(S): Zithromax, Zithromax Tri-Pak, Zithromax Z-Pak What should I tell my health care provider before I take this medicine? They need to know if you have any of these conditions: -history of blood diseases, like leukemia -history of irregular heartbeat -kidney disease -liver disease -myasthenia gravis -an unusual or allergic reaction to azithromycin, erythromycin, other macrolide antibiotics, foods, dyes, or preservatives -pregnant or trying to get pregnant -breast-feeding How should I use this medicine? Take this medicine by mouth with a full glass of water. Follow the directions on the prescription label. The tablets can be taken with food or on an empty stomach. If the medicine upsets your stomach, take it with food. Take your medicine at regular intervals. Do not take your medicine more often than directed. Take all of your medicine as directed even if you think your are better. Do not skip doses or stop your medicine early. Talk to your pediatrician regarding the use of this medicine in children. While this drug may be prescribed for children as young as 6 months for selected conditions, precautions do apply. Overdosage: If you think you have taken too much of this medicine contact a poison control center or emergency room at once. NOTE: This medicine is only for you. Do not share this medicine with others. What if I miss a dose? If you miss a dose, take it as soon as you can. If it is almost time for your next dose, take only that dose. Do not take double or extra doses. What may interact with this medicine? Do not take this  medicine with any of the following medications: -cisapride -dronedarone -pimozide -thioridazine This medicine may also interact with the following medications: -antacids that contain aluminum or magnesium -birth control pills -certain medicines for irregular heart beat like amiodarone, dofetilide, encainide, flecainide, propafenone, quinidine -colchicine -cyclosporine -digoxin -nelfinavir -phenytoin -warfarin This list may not describe all possible interactions. Give your health care provider a list of all the medicines, herbs, non-prescription drugs, or dietary supplements you use. Also tell them if you smoke, drink alcohol, or use illegal drugs. Some items may interact with your medicine. What should I watch for while using this medicine? Tell your doctor or healthcare professional if your symptoms do not start to get better or if they get worse. Do not treat diarrhea with over the counter products. Contact your doctor if you have diarrhea that lasts more than 2 days or if it is severe and watery. This medicine can make you more sensitive to the sun. Keep out of the sun. If you cannot avoid being in the sun, wear protective clothing and use sunscreen. Do not use sun lamps or tanning beds/booths. What side effects may I notice from receiving this medicine? Side effects that you should report to your doctor or health care professional as soon as possible: -allergic reactions like skin rash, itching or hives, swelling of the face, lips, or tongue -bloody or watery diarrhea -breathing problems -chest pain -fast, irregular heartbeat -muscle weakness -redness, blistering, peeling or loosening of the skin, including inside the mouth -signs and symptoms of liver injury like dark yellow or brown urine; general ill feeling or  flu-like symptoms; light-colored stools; loss of appetite; nausea; right upper belly pain; unusually weak or tired; yellowing of the eyes or skin -white patches or sores  in the mouth -unusually weak or tired Side effects that usually do not require medical attention (report to your doctor or health care professional if they continue or are bothersome): -diarrhea -nausea -stomach pain -vomiting This list may not describe all possible side effects. Call your doctor for medical advice about side effects. You may report side effects to FDA at 1-800-FDA-1088. Where should I keep my medicine? Keep out of the reach of children. Store at room temperature between 15 and 30 degrees C (59 and 86 degrees F). Throw away any unused medicine after the expiration date. NOTE: This sheet is a summary. It may not cover all possible information. If you have questions about this medicine, talk to your doctor, pharmacist, or health care provider.  2019 Elsevier/Gold Standard (2017-10-13 15:21:46) Otitis Media, Adult  Otitis media means that the middle ear is red and swollen (inflamed) and full of fluid. The condition usually goes away on its own. Follow these instructions at home:  Take over-the-counter and prescription medicines only as told by your doctor.  If you were prescribed an antibiotic medicine, take it as told by your doctor. Do not stop taking the antibiotic even if you start to feel better.  Keep all follow-up visits as told by your doctor. This is important. Contact a doctor if:  You have bleeding from your nose.  There is a lump on your neck.  You are not getting better in 5 days.  You feel worse instead of better. Get help right away if:  You have pain that is not helped with medicine.  You have swelling, redness, or pain around your ear.  You get a stiff neck.  You cannot move part of your face (paralyzed).  You notice that the bone behind your ear hurts when you touch it.  You get a very bad headache. Summary  Otitis media means that the middle ear is red, swollen, and full of fluid.  This condition usually goes away on its own. In some  cases, treatment may be needed.  If you were prescribed an antibiotic medicine, take it as told by your doctor. This information is not intended to replace advice given to you by your health care provider. Make sure you discuss any questions you have with your health care provider. Document Released: 11/23/2007 Document Revised: 06/27/2016 Document Reviewed: 06/27/2016 Elsevier Interactive Patient Education  2019 Elsevier Inc. Upper Respiratory Infection, Adult An upper respiratory infection (URI) affects the nose, throat, and upper air passages. URIs are caused by germs (viruses). The most common type of URI is often called "the common cold." Medicines cannot cure URIs, but you can do things at home to relieve your symptoms. URIs usually get better within 7-10 days. Follow these instructions at home: Activity  Rest as needed.  If you have a fever, stay home from work or school until your fever is gone, or until your doctor says you may return to work or school. ? You should stay home until you cannot spread the infection anymore (you are not contagious). ? Your doctor may have you wear a face mask so you have less risk of spreading the infection. Relieving symptoms  Gargle with a salt-water mixture 3-4 times a day or as needed. To make a salt-water mixture, completely dissolve -1 tsp of salt in 1 cup of warm water.  Use a cool-mist humidifier to add moisture to the air. This can help you breathe more easily. Eating and drinking   Drink enough fluid to keep your pee (urine) pale yellow.  Eat soups and other clear broths. General instructions   Take over-the-counter and prescription medicines only as told by your doctor. These include cold medicines, fever reducers, and cough suppressants.  Do not use any products that contain nicotine or tobacco. These include cigarettes and e-cigarettes. If you need help quitting, ask your doctor.  Avoid being where people are smoking (avoid  secondhand smoke).  Make sure you get regular shots and get the flu shot every year.  Keep all follow-up visits as told by your doctor. This is important. How to avoid spreading infection to others   Wash your hands often with soap and water. If you do not have soap and water, use hand sanitizer.  Avoid touching your mouth, face, eyes, or nose.  Cough or sneeze into a tissue or your sleeve or elbow. Do not cough or sneeze into your hand or into the air. Contact a doctor if:  You are getting worse, not better.  You have any of these: ? A fever. ? Chills. ? Brown or red mucus in your nose. ? Yellow or brown fluid (discharge)coming from your nose. ? Pain in your face, especially when you bend forward. ? Swollen neck glands. ? Pain with swallowing. ? White areas in the back of your throat. Get help right away if:  You have shortness of breath that gets worse.  You have very bad or constant: ? Headache. ? Ear pain. ? Pain in your forehead, behind your eyes, and over your cheekbones (sinus pain). ? Chest pain.  You have long-lasting (chronic) lung disease along with any of these: ? Wheezing. ? Long-lasting cough. ? Coughing up blood. ? A change in your usual mucus.  You have a stiff neck.  You have changes in your: ? Vision. ? Hearing. ? Thinking. ? Mood. Summary  An upper respiratory infection (URI) is caused by a germ called a virus. The most common type of URI is often called "the common cold."  URIs usually get better within 7-10 days.  Take over-the-counter and prescription medicines only as told by your doctor. This information is not intended to replace advice given to you by your health care provider. Make sure you discuss any questions you have with your health care provider. Document Released: 11/23/2007 Document Revised: 01/27/2017 Document Reviewed: 01/27/2017 Elsevier Interactive Patient Education  2019 [employer]

## 2018-06-19 NOTE — Progress Notes (Signed)
Subjective:     Patient ID: [name] Bowlaymond E Hillmer Jr., male   DOB: Oct 18, 1975, 42 y.o.   MRN: [id number]  HPI   Blood pressure 133/80, pulse 77, temperature 98.2 F (36.8 C), weight 281 lb (127.5 kg), SpO2 97 %.  Patient is a 42 year old male in no acute distress who comes to the clinic for complaints of nasal congestion, scratch throat, cough, headache x 3 days.  He has not taken his temperature. Mild fatigue and body aches.  Chest congestion.   He has taken Nighttime flu capsules and took daytime this morning.  Flu exposure over Christmas.   Allergies  Allergen Reactions  . Penicillins Anaphylaxis  . Shellfish Allergy Anaphylaxis  . Yellow Jacket Venom Shortness Of Breath   Patient  denies any  rash, chest pain, shortness of breath, nausea, vomiting, or diarrhea.    Review of Systems  Constitutional: Positive for chills and fatigue. Negative for activity change, appetite change, diaphoresis, fever (has nor taken temperature has been hot and cold past 2 days. ) and unexpected weight change.  HENT: Positive for congestion, postnasal drip, rhinorrhea, sinus pressure, sneezing and sore throat. Negative for dental problem, drooling, ear discharge, ear pain, facial swelling, hearing loss, mouth sores, nosebleeds, sinus pain, tinnitus, trouble swallowing and voice change.   Respiratory: Positive for cough. Negative for apnea, choking, shortness of breath, wheezing and stridor.   Cardiovascular: Negative.   Gastrointestinal: Negative.   Genitourinary: Negative.   Musculoskeletal: Negative.   Skin: Negative.   Neurological: Negative.   Hematological: Negative.   Psychiatric/Behavioral: Negative.        Objective:   Physical Exam Vitals signs reviewed.  Constitutional:      General: He is not in acute distress.    Appearance: Normal appearance. He is well-groomed. He is not ill-appearing, toxic-appearing or diaphoretic.     Comments: Patient is alert and oriented and responsive to  questions Engages in eye contact with provider. Speaks in full sentences without any pauses without any shortness of breath or distress.    HENT:     Head: Normocephalic and atraumatic.     Right Ear: Hearing, tympanic membrane and ear canal normal. No drainage or tenderness. No middle ear effusion. Tympanic membrane is not erythematous or bulging.     Left Ear: Hearing, ear canal and external ear normal. No drainage or tenderness. A middle ear effusion (yellow fluid behind intact dull appearing tympanic membrane ) is present. Tympanic membrane is erythematous and bulging (mild ).     Nose: Congestion and rhinorrhea present.     Right Sinus: No maxillary sinus tenderness or frontal sinus tenderness.     Left Sinus: No maxillary sinus tenderness or frontal sinus tenderness.     Mouth/Throat:     Lips: Pink.     Mouth: Mucous membranes are moist.     Pharynx: Oropharynx is clear. Uvula midline.     Tonsils: Swelling: 0 on the right. 0 on the left.  Eyes:     General: No scleral icterus.       Right eye: No discharge.        Left eye: No discharge.     Extraocular Movements: Extraocular movements intact.     Conjunctiva/sclera: Conjunctivae normal.     Pupils: Pupils are equal, round, and reactive to light.  Neck:     Musculoskeletal: Full passive range of motion without pain, normal range of motion and neck supple.     Trachea: Trachea and phonation  normal.     Meningeal: Brudzinski's sign absent.  Cardiovascular:     Rate and Rhythm: Normal rate and regular rhythm.     Pulses: Normal pulses.     Heart sounds: Normal heart sounds. No murmur. No friction rub. No gallop.   Pulmonary:     Effort: Pulmonary effort is normal.     Breath sounds: Normal breath sounds.  Musculoskeletal: Normal range of motion.  Lymphadenopathy:     Head:     Right side of head: No submental, submandibular, tonsillar, preauricular, posterior auricular or occipital adenopathy.     Left side of head: No  submental, submandibular, tonsillar, preauricular, posterior auricular or occipital adenopathy.     Cervical: No cervical adenopathy.  Skin:    General: Skin is warm and dry.     Capillary Refill: Capillary refill takes less than 2 seconds.     Nails: There is no clubbing.   Neurological:     Mental Status: He is alert and oriented to person, place, and time.  Psychiatric:        Mood and Affect: Mood normal.        Behavior: Behavior normal. Behavior is cooperative.        Thought Content: Thought content normal.        Judgment: Judgment normal.        Assessment:    Non-recurrent acute serous otitis media of left ear  Upper respiratory tract infection, unspecified type  Flu-like symptoms - Plan: POCT Influenza A/B  Flu test POCT negative. Results for orders placed or performed in visit on 06/19/18 (from the past 24 hour(s))  POCT Influenza A/B     Status: Normal   Collection Time: 06/19/18 10:08 AM  Result Value Ref Range   Influenza A, POC Negative Negative   Influenza B, POC Negative Negative       Plan:    For otitis media left ear take prescribed medicine as below.( allergy to penicillins)  Meds ordered this encounter  Medications  . azithromycin (ZITHROMAX) 250 MG tablet    Sig: By mouth Take 2 tablets day 1 (500mg  total) and 1 tablet ( 250 mg ) on days 2,3,4,5.    Dispense:  6 tablet    Refill:  0   Discussed Flu test negative and rationale could be false negative versus likely viral upper respiratory illness. Treat with symptom management discussed over the counter options per package instructions.  Nasal saline.  Salt water throat gargles.  Hydration.   Past 48 hours exposure to flu no Tamiflu prescribed.   Discussed course of viral upper respiratory illness and when to return to the office.   Gave and reviewed After Visit Summary(AVS) with patient.  Advised patient call the office or your primary care doctor for an appointment if no improvement within  72 hours or if any symptoms change or worsen at any time  Advised ER or urgent Care if after hours or on weekend. Call 911 for emergency symptoms at any time.Patinet verbalized understanding of all instructions given/reviewed and treatment plan and has no further questions or concerns at this time.

## 2018-07-05 ENCOUNTER — Telehealth: Payer: Self-pay | Admitting: Family Medicine

## 2018-07-05 NOTE — Telephone Encounter (Signed)
A message was sent on 05/23/18 to try to find out what type of sleep study needs to be ordered. I have not heard back.Last message was sent under staff message,maybe Dr Sullivan LoneGilbert did not receive.

## 2018-07-06 NOTE — Telephone Encounter (Signed)
Dr Reece Agar can you let [name] [name] know what kind of sleep study you want on him? Thanks

## 2018-08-09 NOTE — Telephone Encounter (Signed)
Sorry for the delay.  I was hoping all the stuff with the sleep studies and getting referrals will work out but there just seems to be massive confusion over this.  At random so smoothly when you were here.  We missed you. This patient has CPAP at home and probably just needs an auto titrating CPAP machine.  However my guess is that insurance will make him do a split-night sleep study.  Acute

## 2018-08-13 ENCOUNTER — Ambulatory Visit: Payer: BLUE CROSS/BLUE SHIELD | Admitting: Family Medicine

## 2018-08-13 ENCOUNTER — Encounter: Payer: Self-pay | Admitting: Family Medicine

## 2018-08-13 VITALS — BP 120/80 | HR 69 | Temp 98.2°F | Resp 16 | Wt 284.0 lb

## 2018-08-13 DIAGNOSIS — Z9889 Other specified postprocedural states: Secondary | ICD-10-CM

## 2018-08-13 DIAGNOSIS — M5441 Lumbago with sciatica, right side: Secondary | ICD-10-CM | POA: Diagnosis not present

## 2018-08-13 MED ORDER — PREDNISONE 10 MG PO TABS: 10.0000 mg | ORAL_TABLET | Freq: Every day | ORAL | 0 refills | Status: DC

## 2018-08-13 MED ORDER — METHOCARBAMOL 750 MG PO TABS: 750.0000 mg | ORAL_TABLET | Freq: Four times a day (QID) | ORAL | 1 refills | Status: DC

## 2018-08-13 NOTE — Progress Notes (Signed)
Patient: [name] [name]. Male    DOB: 1975/10/13   43 y.o.   MRN: [id number] Visit Date: 08/13/2018  Today's Provider: Dortha Kern, PA   Chief Complaint  Patient presents with  . Back Pain   Subjective:     Back Pain  This is a chronic problem. The current episode started in the past 7 days (2 days). The problem occurs constantly. The pain is present in the lumbar spine. The quality of the pain is described as stabbing, shooting, cramping, aching and burning. The pain radiates to the right thigh, right foot and right knee. The pain is at a severity of 8/10. The pain is moderate. The pain is the same all the time. The symptoms are aggravated by sitting, twisting, standing, position and bending. Associated symptoms include leg pain. Pertinent negatives include no abdominal pain, bladder incontinence, bowel incontinence, chest pain, dysuria, fever, headaches, numbness, paresis, paresthesias, pelvic pain, perianal numbness, tingling, weakness or weight loss.   Patient states he was chopping wood Saturday and pulled a muscle in his right lower back. Patient states pain is constant and radiates down his right leg to the right foot.   Past Medical History:  Diagnosis Date  . Depressed   . Depression   . GERD (gastroesophageal reflux disease)   . Headache(784.0)   . Hypertension   . Sleep apnea    cpap   Patient Active Problem List   Diagnosis Date Noted  . Seasonal affective disorder (HCC) 06/27/2016  . Anxiety and depression 12/27/2014  . Allergic rhinitis 12/27/2014  . Depression, major, single episode, mild (HCC) 12/27/2014  . Essential (primary) hypertension 12/27/2014  . Esophageal reflux 12/27/2014  . 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 Apnea, sleep 12/27/2014  . Avitaminosis D 12/27/2014  . Chest pain, central 12/23/2014   Past Surgical History:  Procedure Laterality Date  . BACK SURGERY    . LUMBAR DISC SURGERY    . LUMBAR DISC  SURGERY     bulging disc repaired  . LUMBAR SPINE SURGERY  11/08/2011  . LUMBAR WOUND DEBRIDEMENT  11/23/2011   Procedure: LUMBAR WOUND DEBRIDEMENT;  Surgeon: Karn Cassis, MD;  Location: [facility];  Service: Neurosurgery;  Laterality: N/A;  Incision and drainage of lumbar wound  . spine     spine spurs cleaned up and arthritis   Family History  Problem Relation Age of Onset  . Diabetes Mother   . Hypertension Mother   . Hyperlipidemia Mother   . CAD Other   . Irregular heart beat Maternal Grandmother   . Heart attack Maternal Grandfather    Allergies  Allergen Reactions  . Penicillin G Anaphylaxis  . Penicillins Anaphylaxis  . Shellfish Allergy Anaphylaxis  . Yellow Jacket Venom Shortness Of Breath    Current Outpatient Medications:  .  diazepam (VALIUM) 5 MG tablet, Take 1 tablet (5 mg total) by mouth every 12 (twelve) hours as needed for anxiety., Disp: 30 tablet, Rfl: 1 .  sertraline (ZOLOFT) 100 MG tablet, TAKE 1 TABLET BY MOUTH EVERY DAY, Disp: 90 tablet, Rfl: 2 .  azithromycin (ZITHROMAX) 250 MG tablet, By mouth Take 2 tablets day 1 (500mg  total) and 1 tablet ( 250 mg ) on days 2,3,4,5. (Patient not taking: Reported on 08/13/2018), Disp: 6 tablet, Rfl: 0 .  ciprofloxacin (CILOXAN) 0.3 % ophthalmic solution, PLACE 2 DROPS INTO THE RIGHT EYE EVERY 4 (FOUR) HOURS WHILE AWAKE., Disp: , Rfl: 0 .  EPINEPHRINE, ANAPHYLAXIS THERAPY AGENTS,, EPIPEN 2-PAK, 0.3MG /0.3ML (Injection Device)  1 Device as directed for 0 days  Quantity: 1;  Refills: 12   Ordered :05-Mar-2012  Janey Greaser ;  Started 05-Mar-2012 Active, Disp: , Rfl:  .  methylPREDNISolone (MEDROL) 4 MG tablet, Take as directed (Patient not taking: Reported on 04/10/2018), Disp: 21 tablet, Rfl: 0  Review of Systems  Constitutional: Negative for appetite change, chills, fever and weight loss.  Respiratory: Negative for chest tightness, shortness of breath and wheezing.   Cardiovascular: Negative for chest pain and  palpitations.  Gastrointestinal: Negative for abdominal pain, bowel incontinence, nausea and vomiting.  Genitourinary: Negative for bladder incontinence, dysuria and pelvic pain.  Musculoskeletal: Positive for back pain.  Neurological: Negative for tingling, weakness, numbness, headaches and paresthesias.   Social History   Tobacco Use  . Smoking status: Former Smoker    Packs/day: 0.30    Years: 3.00    Pack years: 0.90    Types: Cigarettes    Last attempt to quit: 08/09/1992    Years since quitting: 26.0  . Smokeless tobacco: Current User    Types: Chew  Substance Use Topics  . Alcohol use: Yes    Alcohol/week: 3.0 - 4.0 standard drinks    Types: 3 - 4 Cans of beer per week    Comment: occasional     Objective:   BP 120/80 (BP Location: Right Arm, Patient Position: Sitting, Cuff Size: Large)   Pulse 69   Temp 98.2 F (36.8 C) (Oral)   Resp 16   Wt 284 lb (128.8 kg)   SpO2 99%   BMI 38.52 kg/m  Vitals:   08/13/18 0937  BP: 120/80  Pulse: 69  Resp: 16  Temp: 98.2 F (36.8 C)  TempSrc: Oral  SpO2: 99%  Weight: 284 lb (128.8 kg)   Physical Exam Constitutional:      General: He is not in acute distress.    Appearance: He is well-developed.  HENT:     Head: Normocephalic and atraumatic.     Right Ear: Hearing normal.     Left Ear: Hearing normal.     Nose: Nose normal.  Eyes:     General: Lids are normal. No scleral icterus.       Right eye: No discharge.        Left eye: No discharge.     Conjunctiva/sclera: Conjunctivae normal.  Pulmonary:     Effort: Pulmonary effort is normal. No respiratory distress.  Musculoskeletal:        General: Tenderness present.     Comments: Tender in the right lower lumbar region to palpate and pins & needles sensation in the right foot to test SLR's to 90 degrees. DTR's symmetric and active in LE. Stiffness to test ROM and sharp pains to bend over.  Skin:    Findings: No lesion or rash.  Neurological:     Mental Status:  He is alert and oriented to person, place, and time.     Deep Tendon Reflexes: Reflexes normal.  Psychiatric:        Speech: Speech normal.        Behavior: Behavior normal.        Thought Content: Thought content normal.       Assessment & Plan    1. Acute right-sided back pain with sciatica Pain and tingling in the right foot after splitting some wood 2 days ago. Will treat with prednisone taper and Robaxin for pain and spasm. No relief  from Ibuprofen yesterday. Prefers not to use narcotics at the present. May proceed with ice and heat applications. Recheck if no better in 7-10 days. - methocarbamol (ROBAXIN) 750 MG tablet; Take 1 tablet (750 mg total) by mouth 4 (four) times daily.  Dispense: 40 tablet; Refill: 1 - predniSONE (DELTASONE) 10 MG tablet; Take 1 tablet (10 mg total) by mouth daily with breakfast. Taper dose down by one tablet daily (6,5,4,3,2,1)  Dispense: 21 tablet; Refill: 0  2. History of lumbar laminectomy for spinal cord decompression Multiple lower lumbar surgeries since 2007. Had post op infection in 2013 and had to have surgery 2 weeks later to clean up surgical area in the lumbar spine. Prefers not to see neurosurgeon again unless absolutely necessary.     Dortha Kern, PA  [facility]

## 2018-08-26 ENCOUNTER — Other Ambulatory Visit: Payer: Self-pay | Admitting: Family Medicine

## 2018-08-26 DIAGNOSIS — F419 Anxiety disorder, unspecified: Principal | ICD-10-CM

## 2018-08-26 DIAGNOSIS — F32A Depression, unspecified: Secondary | ICD-10-CM

## 2018-08-26 DIAGNOSIS — F329 Major depressive disorder, single episode, unspecified: Secondary | ICD-10-CM

## 2018-12-07 ENCOUNTER — Ambulatory Visit: Payer: BC Managed Care – PPO | Attending: Otolaryngology

## 2018-12-07 DIAGNOSIS — F5101 Primary insomnia: Secondary | ICD-10-CM | POA: Diagnosis not present

## 2018-12-07 DIAGNOSIS — G4733 Obstructive sleep apnea (adult) (pediatric): Secondary | ICD-10-CM | POA: Diagnosis not present

## 2018-12-10 ENCOUNTER — Other Ambulatory Visit: Payer: Self-pay

## 2018-12-18 ENCOUNTER — Telehealth: Payer: Self-pay | Admitting: *Deleted

## 2018-12-18 ENCOUNTER — Emergency Department: Payer: BC Managed Care – PPO

## 2018-12-18 ENCOUNTER — Emergency Department
Admission: EM | Admit: 2018-12-18 | Discharge: 2018-12-18 | Disposition: A | Discharge status: Home / Self Care | Payer: BC Managed Care – PPO | Attending: Emergency Medicine | Admitting: Emergency Medicine

## 2018-12-18 ENCOUNTER — Other Ambulatory Visit: Payer: Self-pay

## 2018-12-18 DIAGNOSIS — Z87891 Personal history of nicotine dependence: Secondary | ICD-10-CM | POA: Insufficient documentation

## 2018-12-18 DIAGNOSIS — R0789 Other chest pain: Secondary | ICD-10-CM | POA: Diagnosis not present

## 2018-12-18 DIAGNOSIS — R079 Chest pain, unspecified: Secondary | ICD-10-CM | POA: Diagnosis not present

## 2018-12-18 DIAGNOSIS — I10 Essential (primary) hypertension: Secondary | ICD-10-CM | POA: Insufficient documentation

## 2018-12-18 DIAGNOSIS — Z79899 Other long term (current) drug therapy: Secondary | ICD-10-CM | POA: Diagnosis not present

## 2018-12-18 LAB — CBC
HCT: 43.5 % (ref 39.0–52.0)
Hemoglobin: 15 g/dL (ref 13.0–17.0)
MCH: 31.1 pg (ref 26.0–34.0)
MCHC: 34.5 g/dL (ref 30.0–36.0)
MCV: 90.1 fL (ref 80.0–100.0)
Platelets: 281 10*3/uL (ref 150–400)
RBC: 4.83 MIL/uL (ref 4.22–5.81)
RDW: 13.2 % (ref 11.5–15.5)
WBC: 7.2 10*3/uL (ref 4.0–10.5)
nRBC: 0 % (ref 0.0–0.2)

## 2018-12-18 LAB — BASIC METABOLIC PANEL
Anion gap: 9 (ref 5–15)
BUN: 19 mg/dL (ref 6–20)
CO2: 23 mmol/L (ref 22–32)
Calcium: 9 mg/dL (ref 8.9–10.3)
Chloride: 104 mmol/L (ref 98–111)
Creatinine, Ser: 0.87 mg/dL (ref 0.61–1.24)
GFR calc Af Amer: >60 mL/min (ref >60)
GFR calc non Af Amer: >60 mL/min (ref >60)
Glucose, Bld: 101 mg/dL — ABNORMAL HIGH (ref 70–99)
Potassium: 3.9 mmol/L (ref 3.5–5.1)
Sodium: 136 mmol/L (ref 135–145)

## 2018-12-18 LAB — TROPONIN I (HIGH SENSITIVITY): Troponin I (High Sensitivity): <2 ng/L (ref <18)

## 2018-12-18 MED ORDER — KETOROLAC TROMETHAMINE 30 MG/ML IJ SOLN
30.0000 mg | Freq: Once | INTRAMUSCULAR | Status: AC
  Administered 2018-12-18: 30 mg via INTRAVENOUS
  Filled 2018-12-18: qty 1

## 2018-12-18 MED ORDER — SODIUM CHLORIDE 0.9% FLUSH
3.0000 mL | Freq: Once | INTRAVENOUS | Status: AC
  Administered 2018-12-18: 3 mL via INTRAVENOUS

## 2018-12-18 NOTE — ED Notes (Signed)
AAOx3.  Skin warm and dry.  NAD 

## 2018-12-18 NOTE — Telephone Encounter (Signed)
Please review

## 2018-12-18 NOTE — Telephone Encounter (Signed)
Patient called office with complaints of left arm pain and tightness in his chest. Patient stated pain in his left arm starts at the bend of his elbow and radiates to his neck. Patient stated he had same symptoms a few days ago, but they went away. No sob, dizziness,or headache. Advised pt to go to ER for evaluation. Patient stated he will go today.

## 2018-12-18 NOTE — ED Triage Notes (Signed)
Pt comes via POV from home with c/o left sided chest pain that radiates to his left arm. Pt states this has been on and off since last Thursday. Pt states little bit SOB since wearing the mask.  Pt denies any N/V.

## 2018-12-18 NOTE — ED Provider Notes (Signed)
[facility] Emergency Department Provider Note   ____________________________________________    I have reviewed the triage vital signs and the nursing notes.   HISTORY  Chief Complaint Chest Pain     HPI [name] [name]. is a 43 y.o. male with a history of anxiety, hypertension, sleep apnea who presents today with complaints of left lateral chest discomfort and occasional left elbow/arm pain as well.  He reports this is not exertional and seems to come and go.  Became concerned when this started this morning about 3 to 4 hours prior to arrival and has not improved significantly.  However after sitting in the room for some time he is feeling better.  Denies shortness of breath.  No pleurisy.  No nausea vomiting or diaphoresis  Past Medical History:  Diagnosis Date  . Depressed   . Depression   . GERD (gastroesophageal reflux disease)   . Headache(784.0)   . Hypertension   . Sleep apnea    cpap    Patient Active Problem List   Diagnosis Date Noted  . Seasonal affective disorder ([place]) 06/27/2016  . Anxiety and depression 12/27/2014  . Allergic rhinitis 12/27/2014  . Depression, major, single episode, mild ([place]) 12/27/2014  . Essential (primary) hypertension 12/27/2014  . Esophageal reflux 12/27/2014  . 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 Apnea, sleep 12/27/2014  . Avitaminosis D 12/27/2014  . Chest pain, central 12/23/2014    Past Surgical History:  Procedure Laterality Date  . BACK SURGERY    . LUMBAR DISC SURGERY    . LUMBAR DISC SURGERY     bulging disc repaired  . LUMBAR SPINE SURGERY  11/08/2011  . LUMBAR WOUND DEBRIDEMENT  11/23/2011   Procedure: LUMBAR WOUND DEBRIDEMENT;  Surgeon: Floyce Stakes, MD;  Location: [facility];  Service: Neurosurgery;  Laterality: N/A;  Incision and drainage of lumbar wound  . spine     spine spurs cleaned up and arthritis    Prior to Admission medications    Medication Sig Start Date End Date Taking? Authorizing Provider  diazepam (VALIUM) 5 MG tablet TAKE 1 TABLET (5 MG TOTAL) BY MOUTH EVERY 12 (TWELVE) HOURS AS NEEDED FOR ANXIETY. 08/29/18   Jerrol Banana., MD  EPINEPHRINE, ANAPHYLAXIS THERAPY AGENTS, EPIPEN 2-PAK, 0.3MG /0.3ML (Injection Device)  1 Device as directed for 0 days  Quantity: 1;  Refills: 12   Ordered :05-Mar-2012  Althea Charon ;  Started 05-Mar-2012 Active 03/05/12   [provider]  methocarbamol (ROBAXIN) 750 MG tablet Take 1 tablet (750 mg total) by mouth 4 (four) times daily. 08/13/18   Chrismon, Vickki Muff, PA  predniSONE (DELTASONE) 10 MG tablet Take 1 tablet (10 mg total) by mouth daily with breakfast. Taper dose down by one tablet daily (6,5,4,3,2,1) 08/13/18   Chrismon, Vickki Muff, PA  sertraline (ZOLOFT) 100 MG tablet TAKE 1 TABLET BY MOUTH EVERY DAY 05/02/18   Jerrol Banana., MD     Allergies Penicillin g, Penicillins, Shellfish allergy, and Yellow jacket venom  Family History  Problem Relation Age of Onset  . Diabetes Mother   . Hypertension Mother   . Hyperlipidemia Mother   . CAD Other   . Irregular heart beat Maternal Grandmother   . Heart attack Maternal Grandfather     Social History Social History   Tobacco Use  . Smoking status: Former Smoker    Packs/day: 0.30    Years: 3.00    Pack years:  0.90    Types: Cigarettes    Quit date: 08/09/1992    Years since quitting: 26.3  . Smokeless tobacco: Current User    Types: Chew  Substance Use Topics  . Alcohol use: Yes    Alcohol/week: 3.0 - 4.0 standard drinks    Types: 3 - 4 Cans of beer per week    Comment: occasional  . Drug use: No    Review of Systems  Constitutional: No fever/chills Eyes: No visual changes.  ENT: No sore throat. Cardiovascular: "Achy chest pain " Respiratory: Denies shortness of breath. Gastrointestinal: No abdominal pain.  Genitourinary: Negative for dysuria. Musculoskeletal: Negative for back  pain. Skin: Negative for rash. Neurological: Negative for headaches or weakness   ____________________________________________   PHYSICAL EXAM:  VITAL SIGNS: ED Triage Vitals  Enc Vitals Group     BP 12/18/18 1152 131/86     Pulse Rate 12/18/18 1152 81     Resp 12/18/18 1152 18     Temp 12/18/18 1152 99.2 F (37.3 C)     Temp Source 12/18/18 1152 Oral     SpO2 12/18/18 1152 98 %     Weight 12/18/18 1147 127 kg (280 lb)     Height 12/18/18 1147 1.829 m (6')     Head Circumference --      Peak Flow --      Pain Score 12/18/18 1147 5     Pain Loc --      Pain Edu? --      Excl. in GC? --     Constitutional: Alert and oriented. No acute distress  Mouth/Throat: Mucous membranes are moist.   Neck:  Painless ROM Cardiovascular: Normal rate, regular rhythm. Grossly normal heart sounds.  Good peripheral circulation. Respiratory: Normal respiratory effort.  No retractions. Lungs CTAB. Gastrointestinal: Soft and nontender. No distention.  No CVA tenderness. Genitourinary: deferred Musculoskeletal: No lower extremity tenderness nor edema.  Warm and well perfused Neurologic:  Normal speech and language. No gross focal neurologic deficits are appreciated.  Skin:  Skin is warm, dry and intact. No rash noted. Psychiatric: Mood and affect are normal. Speech and behavior are normal.  ____________________________________________   LABS (all labs ordered are listed, but only abnormal results are displayed)  Labs Reviewed  BASIC METABOLIC PANEL - Abnormal; Notable for the following components:      Result Value   Glucose, Bld 101 (*)    All other components within normal limits  CBC  TROPONIN I (HIGH SENSITIVITY)   ____________________________________________  EKG  ED ECG REPORT I, Jene Everyobert Yamaris Cummings, the attending physician, personally viewed and interpreted this ECG.  Date: 12/18/2018  Rhythm: normal sinus rhythm QRS Axis: normal Intervals: normal ST/T Wave abnormalities:  normal Narrative Interpretation: no evidence of acute ischemia  ____________________________________________  RADIOLOGY  Chest x-ray unremarkable ____________________________________________   PROCEDURES  Procedure(s) performed: No  Procedures   Critical Care performed: No ____________________________________________   INITIAL IMPRESSION / ASSESSMENT AND PLAN / ED COURSE  Pertinent labs & imaging results that were available during my care of the patient were reviewed by me and considered in my medical decision making (see chart for details).  Patient well-appearing and in no acute distress.  Symptoms seem to be resolving here in the emergency department.  EKG is quite reassuring.  High sensitive troponin is less than 2, lab work otherwise unremarkable, chest x-ray is benign.  Given his age and family history and hypertension he will require follow-up with cardiology for further evaluation, strict  return precautions discussed    ____________________________________________   FINAL CLINICAL IMPRESSION(S) / ED DIAGNOSES  Final diagnoses:  Atypical chest pain        Note:  This document was prepared using Dragon voice recognition software and may include unintentional dictation errors.   Jene EveryKinner, Andreyah Natividad, MD 12/18/18 [phone]

## 2018-12-27 ENCOUNTER — Telehealth: Payer: Self-pay | Admitting: Family Medicine

## 2018-12-27 NOTE — Telephone Encounter (Signed)
Trying to locate sleep study. Was sent back earlier this week to be scanned.

## 2018-12-27 NOTE — Telephone Encounter (Signed)
Patient called in regards to his sleep study on 12/07/2018. Patient states that he hasn't heard anything about the results.   Please advise  Thank you, [facility]

## 2018-12-28 DIAGNOSIS — I208 Other forms of angina pectoris: Secondary | ICD-10-CM | POA: Diagnosis not present

## 2018-12-28 DIAGNOSIS — E782 Mixed hyperlipidemia: Secondary | ICD-10-CM | POA: Diagnosis not present

## 2018-12-28 DIAGNOSIS — I10 Essential (primary) hypertension: Secondary | ICD-10-CM | POA: Diagnosis not present

## 2018-12-31 ENCOUNTER — Encounter: Payer: Self-pay | Admitting: Family Medicine

## 2018-12-31 NOTE — Telephone Encounter (Signed)
Dr. Rosanna Randy, can you please review this? What is the next step for this patient?

## 2019-01-09 ENCOUNTER — Encounter: Payer: Self-pay | Admitting: Family Medicine

## 2019-01-10 DIAGNOSIS — I208 Other forms of angina pectoris: Secondary | ICD-10-CM | POA: Diagnosis not present

## 2019-01-10 DIAGNOSIS — I10 Essential (primary) hypertension: Secondary | ICD-10-CM | POA: Diagnosis not present

## 2019-01-10 DIAGNOSIS — E782 Mixed hyperlipidemia: Secondary | ICD-10-CM | POA: Diagnosis not present

## 2019-01-10 DIAGNOSIS — R0789 Other chest pain: Secondary | ICD-10-CM | POA: Diagnosis not present

## 2019-01-28 ENCOUNTER — Encounter: Payer: Self-pay | Admitting: Family Medicine

## 2019-02-08 NOTE — Telephone Encounter (Signed)
Pt has still not heard anything regarding his new c-pap machine that was supposed to be sent to him  CB#  [phone]  Con Memos

## 2019-02-11 ENCOUNTER — Other Ambulatory Visit: Payer: Self-pay

## 2019-02-11 DIAGNOSIS — G4733 Obstructive sleep apnea (adult) (pediatric): Secondary | ICD-10-CM

## 2019-02-11 NOTE — Progress Notes (Signed)
Order placed last sleep study done on 12/07/2018.

## 2019-03-13 ENCOUNTER — Encounter: Payer: Self-pay | Admitting: Family Medicine

## 2019-03-14 DIAGNOSIS — G4733 Obstructive sleep apnea (adult) (pediatric): Secondary | ICD-10-CM | POA: Diagnosis not present

## 2019-03-18 ENCOUNTER — Other Ambulatory Visit: Payer: Self-pay

## 2019-03-18 ENCOUNTER — Ambulatory Visit (INDEPENDENT_AMBULATORY_CARE_PROVIDER_SITE_OTHER): Payer: BC Managed Care – PPO | Admitting: Family Medicine

## 2019-03-18 DIAGNOSIS — Z20822 Contact with and (suspected) exposure to covid-19: Secondary | ICD-10-CM

## 2019-03-18 DIAGNOSIS — R51 Headache: Secondary | ICD-10-CM

## 2019-03-18 DIAGNOSIS — R059 Cough, unspecified: Secondary | ICD-10-CM

## 2019-03-18 DIAGNOSIS — R0981 Nasal congestion: Secondary | ICD-10-CM | POA: Diagnosis not present

## 2019-03-18 DIAGNOSIS — M791 Myalgia, unspecified site: Secondary | ICD-10-CM | POA: Diagnosis not present

## 2019-03-18 DIAGNOSIS — R05 Cough: Secondary | ICD-10-CM

## 2019-03-18 DIAGNOSIS — R519 Headache, unspecified: Secondary | ICD-10-CM

## 2019-03-18 NOTE — Progress Notes (Signed)
Patient: [name] [name]. Male    DOB: 07-13-1975   43 y.o.   MRN: [id number] Visit Date: 03/18/2019  Today's Provider: Lavon Paganini, MD   Chief Complaint  Patient presents with  . URI   Subjective:    Virtual Visit via Telephone Note  I connected with [name] [name]. on 03/18/19 at  8:40 AM EDT by telephone and verified that I am speaking with the correct person using two identifiers.   Patient location: home Provider location: [place] involved in the visit: patient, provider    I discussed the limitations, risks, security and privacy concerns of performing an evaluation and management service by telephone and the availability of in person appointments. I also discussed with the patient that there may be a patient responsible charge related to this service. The patient expressed understanding and agreed to proceed.  URI  This is a new problem. The current episode started yesterday. The problem has been unchanged. There has been no fever. Associated symptoms include congestion, coughing and headaches. Associated symptoms comments: Fatigue . He has tried nothing for the symptoms.  Patient is requesting a COVID test. He also has myalgias.  He thinks this is allergy related, but works for [employer] and can not go back to work without a negative COVID test.  Allergies  Allergen Reactions  . Penicillin G Anaphylaxis  . Penicillins Anaphylaxis  . Shellfish Allergy Anaphylaxis  . Yellow Jacket Venom Shortness Of Breath     Current Outpatient Medications:  .  diazepam (VALIUM) 5 MG tablet, TAKE 1 TABLET (5 MG TOTAL) BY MOUTH EVERY 12 (TWELVE) HOURS AS NEEDED FOR ANXIETY., Disp: 30 tablet, Rfl: 1 .  EPINEPHRINE, ANAPHYLAXIS THERAPY AGENTS,, EPIPEN 2-PAK, 0.3MG /0.3ML (Injection Device)  1 Device as directed for 0 days  Quantity: 1;  Refills: 12   Ordered :05-Mar-2012  Althea Charon ;  Started 05-Mar-2012 Active, Disp: , Rfl:  .  methocarbamol  (ROBAXIN) 750 MG tablet, Take 1 tablet (750 mg total) by mouth 4 (four) times daily., Disp: 40 tablet, Rfl: 1 .  sertraline (ZOLOFT) 100 MG tablet, TAKE 1 TABLET BY MOUTH EVERY DAY, Disp: 90 tablet, Rfl: 2  Review of Systems  Constitutional: Negative.   HENT: Positive for congestion.   Respiratory: Positive for cough.   Cardiovascular: Negative.   Musculoskeletal: Negative.   Neurological: Positive for headaches.    Social History   Tobacco Use  . Smoking status: Former Smoker    Packs/day: 0.30    Years: 3.00    Pack years: 0.90    Types: Cigarettes    Quit date: 08/09/1992    Years since quitting: 26.6  . Smokeless tobacco: Current User    Types: Chew  Substance Use Topics  . Alcohol use: Yes    Alcohol/week: 3.0 - 4.0 standard drinks    Types: 3 - 4 Cans of beer per week    Comment: occasional      Objective:   There were no vitals taken for this visit. There were no vitals filed for this visit.There is no height or weight on file to calculate BMI.   Physical Exam No respiratory distress Speaks in full sentences   No results found for any visits on 03/18/19.     Assessment & Plan    I discussed the assessment and treatment plan with the patient. The patient was provided an opportunity to ask questions and all were answered. The patient agreed with the plan  and demonstrated an understanding of the instructions.   The patient was advised to call back or seek an in-person evaluation if the symptoms worsen or if the condition fails to improve as anticipated.  1. Cough 2. Acute nonintractable headache, unspecified headache type 3. Myalgia 4. Sinus congestion - new symptoms x1d - symptoms most likely c/w viral URI vs allergic rhinitis - discussed possible risk of COVID19 infection - will send for outpatient COVID testing - discussed possibility of false negative and advised self-isolation for 10 days from start of symptoms and for 3 days fever free - trial of  antihistamines and flonase for symptoms as these are consistent with his allergies he typically gets around thi time of year - discussed symptomatic management, natural course, and return precautions - Novel Coronavirus, NAA ([facility])    Return if symptoms worsen or fail to improve.   The entirety of the information documented in the History of Present Illness, Review of Systems and Physical Exam were personally obtained by me. Portions of this information were initially documented by Presley Raddle, CMA and reviewed by me for thoroughness and accuracy.    Kristen Fromm, Marzella Schlein, MD MPH [facility]

## 2019-03-18 NOTE — Patient Instructions (Signed)
COVID-19 Frequently Asked Questions °COVID-19 (coronavirus disease) is an infection that is caused by a large family of viruses. Some viruses cause illness in people and others cause illness in animals like camels, cats, and bats. In some cases, the viruses that cause illness in animals can spread to humans. °Where did the coronavirus come from? °In December 2019, [place] told the World Health Organization (WHO) of several cases of lung disease (human respiratory illness). These cases were linked to an open seafood and livestock market in the city of [place]. The link to the seafood and livestock market suggests that the virus may have spread from animals to humans. However, since that first outbreak in December, the virus has also been shown to spread from person to person. °What is the name of the disease and the virus? °Disease name °Early on, this disease was called novel coronavirus. This is because scientists determined that the disease was caused by a new (novel) respiratory virus. The World Health Organization (WHO) has now named the disease COVID-19, or coronavirus disease. °Virus name °The virus that causes the disease is called severe acute respiratory syndrome coronavirus 2 (SARS-CoV-2). °More information on disease and virus naming °World Health Organization (WHO): www.who.int/emergencies/diseases/novel-coronavirus-2019/technical-guidance/naming-the-coronavirus-disease-(covid-2019)-and-the-virus-that-causes-it °Who is at risk for complications from coronavirus disease? °Some people may be at higher risk for complications from coronavirus disease. This includes older adults and people who have chronic diseases, such as heart disease, diabetes, and lung disease. °If you are at higher risk for complications, take these extra precautions: °· Avoid close contact with people who are sick or have a fever or cough. Stay at least 3-6 ft (1-2 m) away from them, if possible. °· Wash your hands often with soap and  water for at least 20 seconds. °· Avoid touching your face, mouth, nose, or eyes. °· Keep supplies on hand at home, such as food, medicine, and cleaning supplies. °· Stay home as much as possible. °· Avoid social gatherings and travel. °How does coronavirus disease spread? °The virus that causes coronavirus disease spreads easily from person to person (is contagious). There are also cases of community-spread disease. This means the disease has spread to: °· People who have no known contact with other infected people. °· People who have not traveled to areas where there are known cases. °It appears to spread from one person to another through droplets from coughing or sneezing. °Can I get the virus from touching surfaces or objects? °There is still a lot that we do not know about the virus that causes coronavirus disease. Scientists are basing a lot of information on what they know about similar viruses, such as: °· Viruses cannot generally survive on surfaces for long. They need a human body (host) to survive. °· It is more likely that the virus is spread by close contact with people who are sick (direct contact), such as through: °? Shaking hands or hugging. °? Breathing in respiratory droplets that travel through the air. This can happen when an infected person coughs or sneezes on or near other people. °· It is less likely that the virus is spread when a person touches a surface or object that has the virus on it (indirect contact). The virus may be able to enter the body if the person touches a surface or object and then touches his or her face, eyes, nose, or mouth. °Can a person spread the virus without having symptoms of the disease? °It may be possible for the virus to spread before a person   has symptoms of the disease, but this is most likely not the main way the virus is spreading. It is more likely for the virus to spread by being in close contact with people who are sick and breathing in the respiratory  droplets of a sick person's cough or sneeze. °What are the symptoms of coronavirus disease? °Symptoms vary from person to person and can range from mild to severe. Symptoms may include: °· Fever. °· Cough. °· Tiredness, weakness, or fatigue. °· Fast breathing or feeling short of breath. °These symptoms can appear anywhere from 2 to 14 days after you have been exposed to the virus. If you develop symptoms, call your health care provider. People with severe symptoms may need hospital care. °If I am exposed to the virus, how long does it take before symptoms start? °Symptoms of coronavirus disease may appear anywhere from 2 to 14 days after a person has been exposed to the virus. If you develop symptoms, call your health care provider. °Should I be tested for this virus? °Your health care provider will decide whether to test you based on your symptoms, history of exposure, and your risk factors. °How does a health care provider test for this virus? °Health care providers will collect samples to send for testing. Samples may include: °· Taking a swab of fluid from the nose. °· Taking fluid from the lungs by having you cough up mucus (sputum) into a sterile cup. °· Taking a blood sample. °· Taking a stool or urine sample. °Is there a treatment or vaccine for this virus? °Currently, there is no vaccine to prevent coronavirus disease. Also, there are no medicines like antibiotics or antivirals to treat the virus. A person who becomes sick is given supportive care, which means rest and fluids. A person may also relieve his or her symptoms by using over-the-counter medicines that treat sneezing, coughing, and runny nose. These are the same medicines that a person takes for the common cold. °If you develop symptoms, call your health care provider. People with severe symptoms may need hospital care. °What can I do to protect myself and my family from this virus? ° °  ° °You can protect yourself and your family by taking the  same actions that you would take to prevent the spread of other viruses. Take the following actions: °· Wash your hands often with soap and water for at least 20 seconds. If soap and water are not available, use alcohol-based hand sanitizer. °· Avoid touching your face, mouth, nose, or eyes. °· Cough or sneeze into a tissue, sleeve, or elbow. Do not cough or sneeze into your hand or the air. °? If you cough or sneeze into a tissue, throw it away immediately and wash your hands. °· Disinfect objects and surfaces that you frequently touch every day. °· Avoid close contact with people who are sick or have a fever or cough. Stay at least 3-6 ft (1-2 m) away from them, if possible. °· Stay home if you are sick, except to get medical care. Call your health care provider before you get medical care. °· Make sure your vaccines are up to date. Ask your health care provider what vaccines you need. °What should I do if I need to travel? °Follow travel recommendations from your local health authority, the CDC, and WHO. °Travel information and advice °· Centers for Disease Control and Prevention (CDC): www.cdc.gov/coronavirus/2019-ncov/travelers/index.html °· World Health Organization (WHO): www.who.int/emergencies/diseases/novel-coronavirus-2019/travel-advice °Know the risks and take action to protect your health °·   You are at higher risk of getting coronavirus disease if you are traveling to areas with an outbreak or if you are exposed to travelers from areas with an outbreak. °· Wash your hands often and practice good hygiene to lower the risk of catching or spreading the virus. °What should I do if I am sick? °General instructions to stop the spread of infection °· Wash your hands often with soap and water for at least 20 seconds. If soap and water are not available, use alcohol-based hand sanitizer. °· Cough or sneeze into a tissue, sleeve, or elbow. Do not cough or sneeze into your hand or the air. °· If you cough or  sneeze into a tissue, throw it away immediately and wash your hands. °· Stay home unless you must get medical care. Call your health care provider or local health authority before you get medical care. °· Avoid public areas. Do not take public transportation, if possible. °· If you can, wear a mask if you must go out of the house or if you are in close contact with someone who is not sick. °Keep your home clean °· Disinfect objects and surfaces that are frequently touched every day. This may include: °? Counters and tables. °? Doorknobs and light switches. °? Sinks and faucets. °? Electronics such as phones, remote controls, keyboards, computers, and tablets. °· Wash dishes in hot, soapy water or use a dishwasher. Air-dry your dishes. °· Wash laundry in hot water. °Prevent infecting other household members °· Let healthy household members care for children and pets, if possible. If you have to care for children or pets, wash your hands often and wear a mask. °· Sleep in a different bedroom or bed, if possible. °· Do not share personal items, such as razors, toothbrushes, deodorant, combs, brushes, towels, and washcloths. °Where to find more information °Centers for Disease Control and Prevention (CDC) °· Information and news updates: www.cdc.gov/coronavirus/2019-ncov °World Health Organization (WHO) °· Information and news updates: www.who.int/emergencies/diseases/novel-coronavirus-2019 °· Coronavirus health topic: www.who.int/health-topics/coronavirus °· Questions and answers on COVID-19: www.who.int/news-room/q-a-detail/q-a-coronaviruses °· Global tracker: who.sprinklr.com °American Academy of Pediatrics (AAP) °· Information for families: www.healthychildren.org/English/health-issues/conditions/chest-lungs/Pages/2019-Novel-Coronavirus.aspx °The coronavirus situation is changing rapidly. Check your local health authority website or the CDC and WHO websites for updates and news. °When should I contact a health care  provider? °· Contact your health care provider if you have symptoms of an infection, such as fever or cough, and you: °? Have been near anyone who is known to have coronavirus disease. °? Have come into contact with a person who is suspected to have coronavirus disease. °? Have traveled outside of the country. °When should I get emergency medical care? °· Get help right away by calling your local emergency services (911 in the [place] if you have: °? Trouble breathing. °? Pain or pressure in your chest. °? Confusion. °? Blue-tinged lips and fingernails. °? Difficulty waking from sleep. °? Symptoms that get worse. °Let the emergency medical personnel know if you think you have coronavirus disease. °Summary °· A new respiratory virus is spreading from person to person and causing COVID-19 (coronavirus disease). °· The virus that causes COVID-19 appears to spread easily. It spreads from one person to another through droplets from coughing or sneezing. °· Older adults and those with chronic diseases are at higher risk of disease. If you are at higher risk for complications, take extra precautions. °· There is currently no vaccine to prevent coronavirus disease. There are no medicines, such as antibiotics or   antivirals, to treat the virus. °· You can protect yourself and your family by washing your hands often, avoiding touching your face, and covering your coughs and sneezes. °This information is not intended to replace advice given to you by your health care provider. Make sure you discuss any questions you have with your health care provider. °Document Released: 10/02/2018 Document Revised: 10/02/2018 Document Reviewed: 10/02/2018 °Elsevier Patient Education © 2020 [employer] ° °

## 2019-03-19 ENCOUNTER — Encounter: Payer: Self-pay | Admitting: Family Medicine

## 2019-03-19 LAB — SPECIMEN STATUS REPORT

## 2019-03-19 LAB — NOVEL CORONAVIRUS, NAA: SARS-CoV-2, NAA: NOT DETECTED

## 2019-03-26 ENCOUNTER — Encounter: Payer: Self-pay | Admitting: Family Medicine

## 2019-03-26 ENCOUNTER — Telehealth: Payer: Self-pay

## 2019-03-26 NOTE — Telephone Encounter (Signed)
Pt stated that he had a virtual visit with Dr. Jacinto Reap on 03/18/2019 and was advised to stay out of work 10 days from onset of symptoms and yesterday was the 10 day. Pt stated that he returned to work today but work is requesting a return to work from our office. Pt would like the return to work note be put in his MyChart that he can return today. Please advise. Thanks TNP

## 2019-03-27 ENCOUNTER — Encounter: Payer: Self-pay | Admitting: Family Medicine

## 2019-03-27 ENCOUNTER — Telehealth: Payer: Self-pay | Admitting: Family Medicine

## 2019-03-27 NOTE — Telephone Encounter (Signed)
See other phone message and Mychart message. Was completed this morning and sent in Mychart.

## 2019-03-27 NOTE — Telephone Encounter (Signed)
Letter sent.

## 2019-03-27 NOTE — Telephone Encounter (Signed)
Pt needing a note to return to work.  He called yesterday but didn't get anything. Please sent a note in [place].  Thanks, [employer]

## 2019-04-02 ENCOUNTER — Ambulatory Visit: Payer: Self-pay

## 2019-04-02 ENCOUNTER — Other Ambulatory Visit: Payer: Self-pay

## 2019-04-02 DIAGNOSIS — Z23 Encounter for immunization: Secondary | ICD-10-CM

## 2019-04-06 IMAGING — CR DG FOOT COMPLETE 3+V*R*
1 series · 3 of 3 positions shown · non-contrast
Comparison: No recent prior .

CLINICAL DATA: Pain medial side of foot and heel.

EXAM:
RIGHT FOOT COMPLETE - 3+ VIEW

[Series 1: dg foot complete right · 0.14mm/px · 3 of 3 slices shown]
[im 1/3]
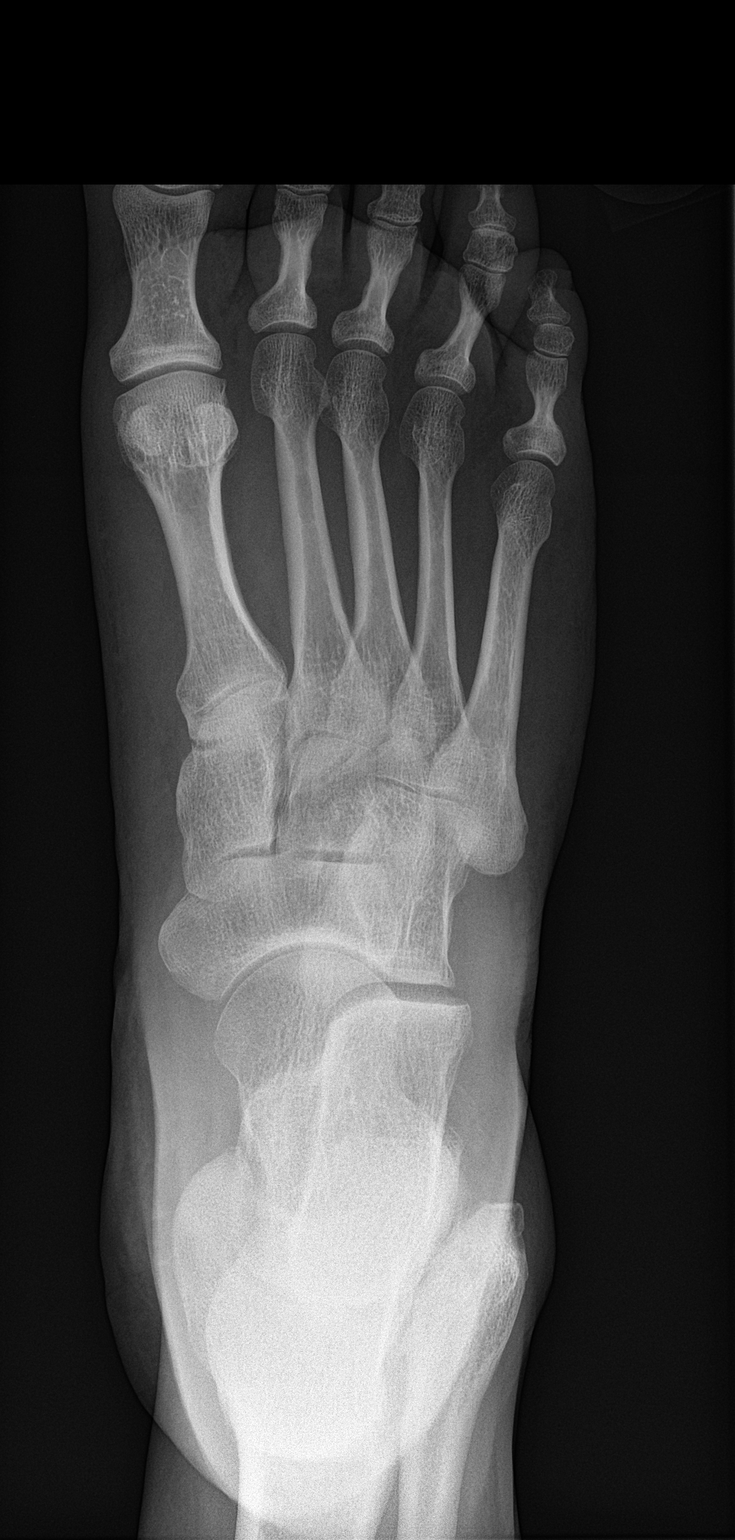
[im 2/3]
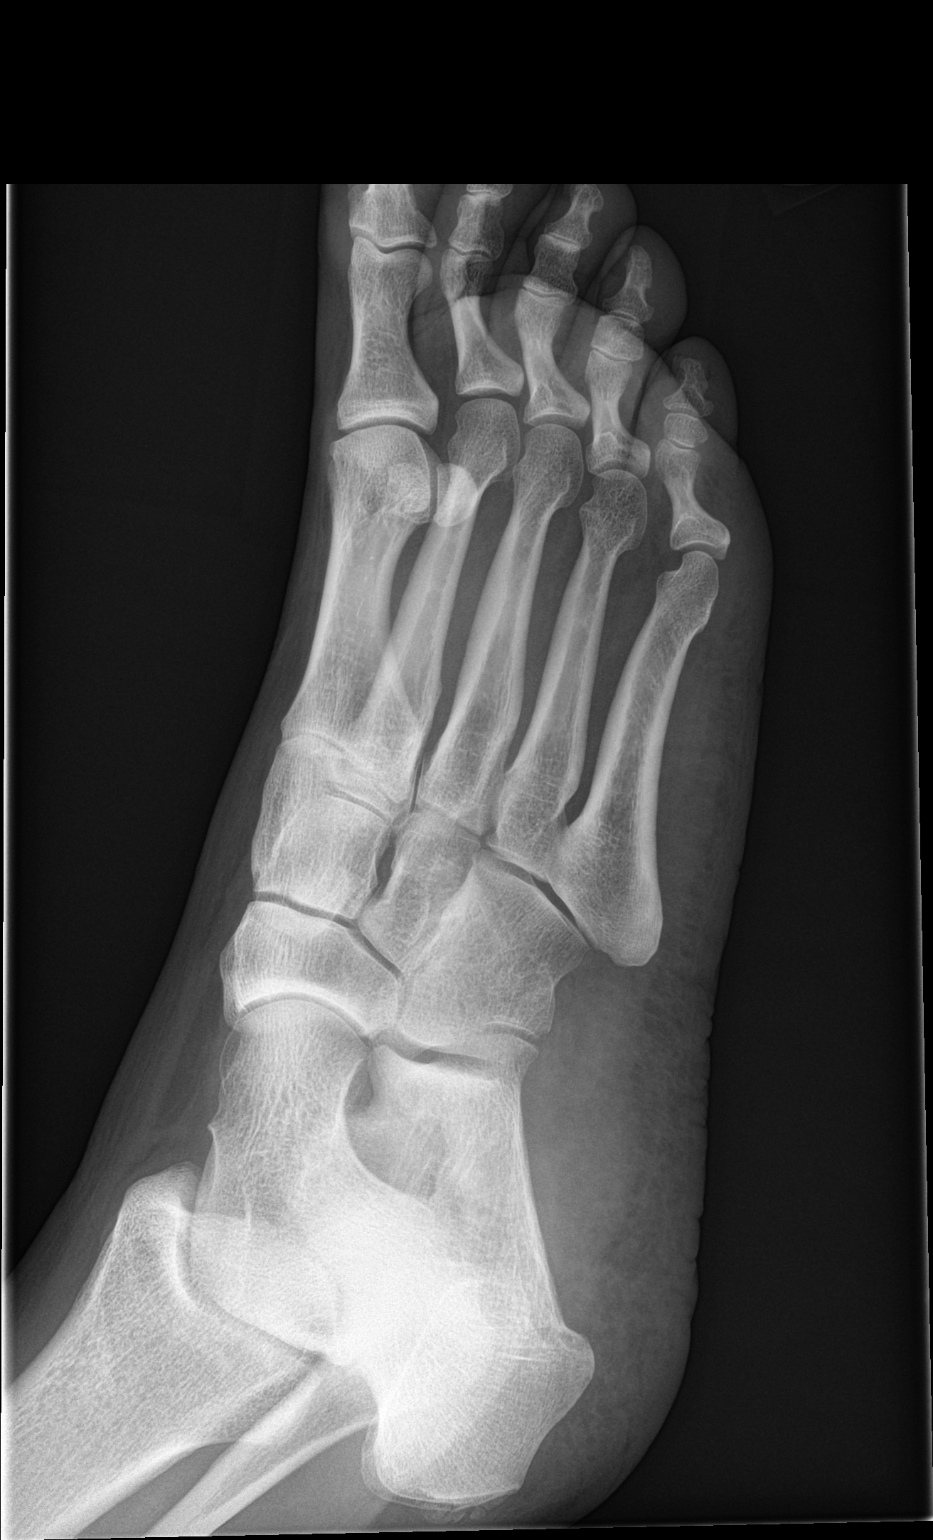
[im 3/3]
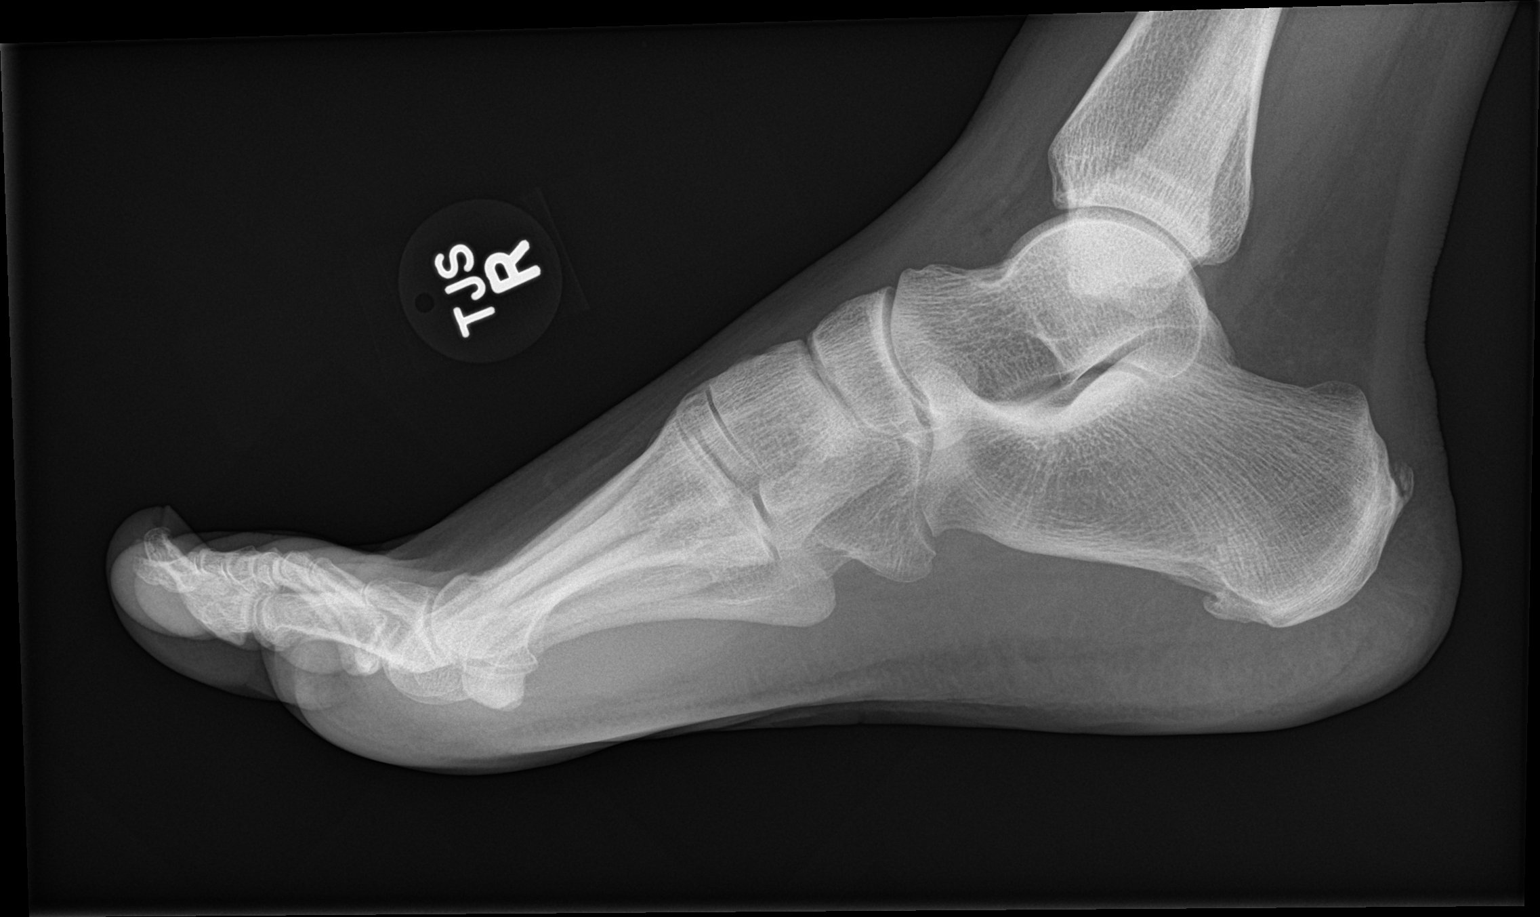

[3 of 3 positions shown; findings below may reference images not displayed]

FINDINGS: No acute bony or joint abnormality identified. No evidence of
fracture dislocation.
IMPRESSION: Negative.

## 2019-04-13 DIAGNOSIS — G4733 Obstructive sleep apnea (adult) (pediatric): Secondary | ICD-10-CM | POA: Diagnosis not present

## 2019-04-28 DIAGNOSIS — Z20828 Contact with and (suspected) exposure to other viral communicable diseases: Secondary | ICD-10-CM | POA: Diagnosis not present

## 2019-05-10 ENCOUNTER — Ambulatory Visit: Payer: Self-pay | Admitting: *Deleted

## 2019-05-10 ENCOUNTER — Encounter: Payer: Self-pay | Admitting: Family Medicine

## 2019-05-10 ENCOUNTER — Telehealth: Payer: Self-pay | Admitting: *Deleted

## 2019-05-10 NOTE — Telephone Encounter (Signed)
FYI from [name] 

## 2019-05-10 NOTE — Telephone Encounter (Signed)
Need telephone or video visit to document and be sure he does or does not need testing also.

## 2019-05-10 NOTE — Telephone Encounter (Signed)
Please advise work letter for pt quarantine?

## 2019-05-10 NOTE — Telephone Encounter (Signed)
Pt states his mother tested +COVID on 05/06/2019; he would like to know what if he can work if he is asymptomatic; explained that there are community testing sites that do not require MD's order; also explained that he needs to speak with his employer regarding working even though he has no symptoms; recommendations made per nurse triage protocol; instructions also given criteria to stop self quarantine: Non-Test Criteria for Ending Self-Isolation All persons with fever and respiratory symptoms should isolate themselves until ALL conditions listed below are met: - at least 10 days since symptoms onset - AND 3 consecutive days fever free without antipyretics (acetaminophen [Tylenol] or ibuprofen [Advil]) - AND improvement in respiratory symptoms He verbalized understanding; hhe pt sees Dr Rosanna Randy, [facility]; will route for notification.  Reason for Disposition . [1] COVID-19 EXPOSURE (Close Contact) AND [2] within last 14 days BUT [3] NO symptoms  Answer Assessment - Initial Assessment Questions 1. CLOSE CONTACT: "Who is the person with the confirmed or suspected COVID-19 infection that you were exposed to?"     Pt's mother 2. PLACE of CONTACT: "Where were you when you were exposed to COVID-19?" (e.g., home, school, medical waiting room; which city?)    [place], [place] 3. TYPE of CONTACT: "How much contact was there?" (e.g., sitting next to, live in same house, work in same office, same building)   Lives in same ome 4. DURATION of CONTACT: "How long were you in contact with the COVID-19 patient?" (e.g., a few seconds, passed by person, a few minutes, live with the patient)     days 5. DATE of CONTACT: "When did you have contact with a COVID-19 patient?" (e.g., how many days ago)     Lives in same home 6. TRAVEL: "Have you traveled out of the country recently?" If so, "When and where?"     * Also ask about out-of-state travel, since the CDC has identified some high-risk cities for  community spread in the [place].     * Note: Travel becomes less relevant if there is widespread community transmission where the patient lives.   no 7. COMMUNITY SPREAD: "Are there lots of cases of COVID-19 (community spread) where you live?" (See public health department website, if unsure)      [place] 8. SYMPTOMS: "Do you have any symptoms?" (e.g., fever, cough, breathing difficulty)     no 9. PREGNANCY OR POSTPARTUM: "Is there any chance you are pregnant?" "When was your last menstrual period?" "Did you deliver in the last 2 weeks?"    n/a 10. HIGH RISK: "Do you have any heart or lung problems? Do you have a weak immune system?" (e.g., CHF, COPD, asthma, HIV positive, chemotherapy, renal failure, diabetes mellitus, sickle cell anemia)      no  Protocols used: CORONAVIRUS (COVID-19) EXPOSURE-A-AH

## 2019-05-10 NOTE — Telephone Encounter (Signed)
Summary: covid    Pt called and stated that his mother lives with him and she tested positive for covid and would like to know what to do. Please advise      Attempted to call patient- left message to call back

## 2019-05-10 NOTE — Telephone Encounter (Signed)
Source Subject Topic  Tavares, Levinson. "[name] [name]" (Patient) Granlund, Mayer Masker. "[name] [name]" (Patient) General - Other  Summary: questions regarding quarentine  Reason for CRM: pt called saying his mom tested positive for Covid on 05/06/19. He wants to know what should he do for quarantine. His employer needs to know what his medical provider suggest that he should do and he will need this is writing what heshould do.     Best Call back [phone]

## 2019-05-13 DIAGNOSIS — Z20828 Contact with and (suspected) exposure to other viral communicable diseases: Secondary | ICD-10-CM | POA: Diagnosis not present

## 2019-05-13 NOTE — Telephone Encounter (Signed)
Patient was advised. Patient stated he does not need letter now because the [employer] is giving him a letter for work.

## 2019-05-14 DIAGNOSIS — G4733 Obstructive sleep apnea (adult) (pediatric): Secondary | ICD-10-CM | POA: Diagnosis not present

## 2019-05-15 ENCOUNTER — Telehealth: Payer: Self-pay | Admitting: *Deleted

## 2019-05-15 NOTE — Telephone Encounter (Signed)
Source Subject Topic  Moret, Da E Jr. "Ray" (Patient) Coward, Aaro E Jr. "Ray" (Patient) General - Other  Summary: questions regarding quarentine  Reason for CRM: pt called saying his mom tested positive for Covid on 05/06/19. He wants to know what should he do for quarantine. His employer needs to know what his medical provider suggest that he should do and he will need this is writing what heshould do.     Best Call back [phone]    

## 2019-05-15 NOTE — Telephone Encounter (Signed)
This message has been completed. See previous message.

## 2019-05-27 ENCOUNTER — Emergency Department: Payer: BC Managed Care – PPO

## 2019-05-27 ENCOUNTER — Emergency Department
Admission: EM | Admit: 2019-05-27 | Discharge: 2019-05-27 | Disposition: A | Discharge status: Home / Self Care | Payer: BC Managed Care – PPO | Attending: Emergency Medicine | Admitting: Emergency Medicine

## 2019-05-27 ENCOUNTER — Other Ambulatory Visit: Payer: Self-pay

## 2019-05-27 DIAGNOSIS — F17220 Nicotine dependence, chewing tobacco, uncomplicated: Secondary | ICD-10-CM | POA: Diagnosis not present

## 2019-05-27 DIAGNOSIS — U071 COVID-19: Secondary | ICD-10-CM | POA: Diagnosis not present

## 2019-05-27 DIAGNOSIS — R079 Chest pain, unspecified: Secondary | ICD-10-CM | POA: Diagnosis not present

## 2019-05-27 DIAGNOSIS — I10 Essential (primary) hypertension: Secondary | ICD-10-CM | POA: Insufficient documentation

## 2019-05-27 DIAGNOSIS — R0602 Shortness of breath: Secondary | ICD-10-CM | POA: Diagnosis not present

## 2019-05-27 LAB — CBC
HCT: 43 % (ref 39.0–52.0)
Hemoglobin: 14.7 g/dL (ref 13.0–17.0)
MCH: 30.5 pg (ref 26.0–34.0)
MCHC: 34.2 g/dL (ref 30.0–36.0)
MCV: 89.2 fL (ref 80.0–100.0)
Platelets: 300 10*3/uL (ref 150–400)
RBC: 4.82 MIL/uL (ref 4.22–5.81)
RDW: 12.7 % (ref 11.5–15.5)
WBC: 7.2 10*3/uL (ref 4.0–10.5)
nRBC: 0 % (ref 0.0–0.2)

## 2019-05-27 LAB — BASIC METABOLIC PANEL
Anion gap: 7 (ref 5–15)
BUN: 19 mg/dL (ref 6–20)
CO2: 26 mmol/L (ref 22–32)
Calcium: 8.8 mg/dL — ABNORMAL LOW (ref 8.9–10.3)
Chloride: 106 mmol/L (ref 98–111)
Creatinine, Ser: 0.85 mg/dL (ref 0.61–1.24)
GFR calc Af Amer: >60 mL/min (ref >60)
GFR calc non Af Amer: >60 mL/min (ref >60)
Glucose, Bld: 129 mg/dL — ABNORMAL HIGH (ref 70–99)
Potassium: 4.1 mmol/L (ref 3.5–5.1)
Sodium: 139 mmol/L (ref 135–145)

## 2019-05-27 LAB — TROPONIN I (HIGH SENSITIVITY): Troponin I (High Sensitivity): <2 ng/L (ref <18)

## 2019-05-27 MED ORDER — HYDROCOD POLST-CPM POLST ER 10-8 MG/5ML PO SUER
5.0000 mL | Freq: Once | ORAL | Status: AC
  Administered 2019-05-27: 5 mL via ORAL
  Filled 2019-05-27: qty 5

## 2019-05-27 MED ORDER — GUAIFENESIN-CODEINE 100-10 MG/5ML PO SOLN: 5.0000 mL | Freq: Four times a day (QID) | ORAL | 0 refills | Status: DC | PRN

## 2019-05-27 NOTE — ED Triage Notes (Signed)
Pt states has covid and is shob. Pt states does have chest pain, cough noted. Pt was diagnosed on 05/15/2019. No resp distress noted.

## 2019-05-27 NOTE — ED Provider Notes (Signed)
[facility] Emergency Department Provider Note  Time seen: 8:48 AM  I have reviewed the triage vital signs and the nursing notes.   HISTORY  Chief Complaint Shortness of Breath   HPI [name] Fan. is a 43 y.o. male with a past medical history of depression, gastric reflux, hypertension, presents to the emergency department for cough and shortness of breath.  According to the patient he tested positive for Covid 05/15/2019.  States he is feeling better but continues to have shortness of breath with exertion continues to have occasional cough with chest discomfort with cough.  Denies any pleuritic pain.  No fevers recently.  Patient states when he is resting he feels normal however with minimal exertion he begins feeling short of breath once again.   Past Medical History:  Diagnosis Date  . Depressed   . Depression   . GERD (gastroesophageal reflux disease)   . Headache(784.0)   . Hypertension   . Sleep apnea    cpap    Patient Active Problem List   Diagnosis Date Noted  . Seasonal affective disorder (HCC) 06/27/2016  . Anxiety and depression 12/27/2014  . Allergic rhinitis 12/27/2014  . Depression, major, single episode, mild (HCC) 12/27/2014  . Essential (primary) hypertension 12/27/2014  . Esophageal reflux 12/27/2014  . 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 Apnea, sleep 12/27/2014  . Avitaminosis D 12/27/2014  . Chest pain, central 12/23/2014    Past Surgical History:  Procedure Laterality Date  . BACK SURGERY    . LUMBAR DISC SURGERY    . LUMBAR DISC SURGERY     bulging disc repaired  . LUMBAR SPINE SURGERY  11/08/2011  . LUMBAR WOUND DEBRIDEMENT  11/23/2011   Procedure: LUMBAR WOUND DEBRIDEMENT;  Surgeon: Karn Cassis, MD;  Location: [facility];  Service: Neurosurgery;  Laterality: N/A;  Incision and drainage of lumbar wound  . spine     spine spurs cleaned up and arthritis    Prior to Admission  medications   Medication Sig Start Date End Date Taking? Authorizing Provider  diazepam (VALIUM) 5 MG tablet TAKE 1 TABLET (5 MG TOTAL) BY MOUTH EVERY 12 (TWELVE) HOURS AS NEEDED FOR ANXIETY. 08/29/18   Maple Hudson., MD  EPINEPHRINE, ANAPHYLAXIS THERAPY AGENTS, EPIPEN 2-PAK, 0.3MG /0.3ML (Injection Device)  1 Device as directed for 0 days  Quantity: 1;  Refills: 12   Ordered :05-Mar-2012  Janey Greaser ;  Started 05-Mar-2012 Active 03/05/12   [provider]  methocarbamol (ROBAXIN) 750 MG tablet Take 1 tablet (750 mg total) by mouth 4 (four) times daily. 08/13/18   Chrismon, Jodell Cipro, PA  sertraline (ZOLOFT) 100 MG tablet TAKE 1 TABLET BY MOUTH EVERY DAY 05/02/18   Maple Hudson., MD    Allergies  Allergen Reactions  . Penicillin G Anaphylaxis  . Penicillins Anaphylaxis  . Shellfish Allergy Anaphylaxis  . Yellow Jacket Venom Shortness Of Breath    Family History  Problem Relation Age of Onset  . Diabetes Mother   . Hypertension Mother   . Hyperlipidemia Mother   . CAD Other   . Irregular heart beat Maternal Grandmother   . Heart attack Maternal Grandfather     Social History Social History   Tobacco Use  . Smoking status: Former Smoker    Packs/day: 0.30    Years: 3.00    Pack years: 0.90    Types: Cigarettes    Quit date: 08/09/1992    Years  since quitting: 26.8  . Smokeless tobacco: Current User    Types: Chew  Substance Use Topics  . Alcohol use: Yes    Alcohol/week: 3.0 - 4.0 standard drinks    Types: 3 - 4 Cans of beer per week    Comment: occasional  . Drug use: No    Review of Systems Constitutional: Negative for fever. Cardiovascular: Minimal chest discomfort with coughing Respiratory: Shortness of breath with exertion only Gastrointestinal: Negative for abdominal pain Musculoskeletal: Negative for musculoskeletal complaints Neurological: Negative for headache All other ROS  negative  ____________________________________________   PHYSICAL EXAM:  VITAL SIGNS: ED Triage Vitals  Enc Vitals Group     BP 05/27/19 0708 (!) 145/86     Pulse Rate 05/27/19 0708 68     Resp 05/27/19 0708 20     Temp 05/27/19 0709 97.7 F (36.5 C)     Temp Source 05/27/19 0708 Oral     SpO2 05/27/19 0708 99 %     Weight 05/27/19 0709 290 lb (131.5 kg)     Height 05/27/19 0709 6\' 1"  (1.854 m)     Head Circumference --      Peak Flow --      Pain Score 05/27/19 0708 4     Pain Loc --      Pain Edu? --      Excl. in GC? --     Constitutional: Alert and oriented. Well appearing and in no distress. Eyes: Normal exam ENT      Head: Normocephalic and atraumatic.      Mouth/Throat: Mucous membranes are moist. Cardiovascular: Normal rate, regular rhythm. Respiratory: Normal respiratory effort without tachypnea nor retractions. Breath sounds are clear  Gastrointestinal: Soft and nontender. No distention.   Musculoskeletal: Nontender with normal range of motion in all extremities.  No lower extremity edema or tenderness. Neurologic:  Normal speech and language. No gross focal neurologic deficits  Skin:  Skin is warm, dry and intact.  Psychiatric: Mood and affect are normal.   ____________________________________________    EKG  EKG viewed and interpreted by myself shows a normal sinus rhythm at 70 bpm with a narrow QRS, normal axis, normal intervals, no concerning ST changes.  ____________________________________________    RADIOLOGY  Chest x-ray shows no acute findings.  ____________________________________________   INITIAL IMPRESSION / ASSESSMENT AND PLAN / ED COURSE  Pertinent labs & imaging results that were available during my care of the patient were reviewed by me and considered in my medical decision making (see chart for details).   Patient presents to the emergency department continued shortness of breath with exertion with continued cough at times.   Patient tested positive for Covid 05/15/2027.  Highly suspect the patient symptoms to be ongoing.  Patient states he thought he could return to work today however when he attempted to get ready this morning he began feeling short of breath again he came to the emergency department for evaluation.  Currently the patient appears very well, clear lung sounds, overall normal vitals including a 99% room air saturation and a normal pulse rate.  Patient's work-up including troponin is negative, chest x-ray shows no acute findings EKG is reassuring as well.  Highly suspect the patient symptoms to be due to resolving Covid infection.  I discussed with the patient that it will likely take more time for him to begin feeling better, I also discussed return precautions.  However at this time with normal vitals normal saturation and no pleuritic pain no  clinical suspicion for PE.  [name] [name]. was evaluated in Emergency Department on 05/27/2019 for the symptoms described in the history of present illness. He was evaluated in the context of the global COVID-19 pandemic, which necessitated consideration that the patient might be at risk for infection with the SARS-CoV-2 virus that causes COVID-19. Institutional protocols and algorithms that pertain to the evaluation of patients at risk for COVID-19 are in a state of rapid change based on information released by regulatory bodies including the CDC and federal and state organizations. These policies and algorithms were followed during the patient's care in the ED.  ____________________________________________   FINAL CLINICAL IMPRESSION(S) / ED DIAGNOSES  Dyspnea   Harvest Dark, MD 05/27/19 [phone]

## 2019-06-13 DIAGNOSIS — G4733 Obstructive sleep apnea (adult) (pediatric): Secondary | ICD-10-CM | POA: Diagnosis not present

## 2019-07-09 DIAGNOSIS — M25561 Pain in right knee: Secondary | ICD-10-CM | POA: Diagnosis not present

## 2019-07-09 DIAGNOSIS — M25562 Pain in left knee: Secondary | ICD-10-CM | POA: Diagnosis not present

## 2019-07-14 DIAGNOSIS — G4733 Obstructive sleep apnea (adult) (pediatric): Secondary | ICD-10-CM | POA: Diagnosis not present

## 2019-07-24 DIAGNOSIS — M25569 Pain in unspecified knee: Secondary | ICD-10-CM | POA: Diagnosis not present

## 2019-07-24 DIAGNOSIS — M5416 Radiculopathy, lumbar region: Secondary | ICD-10-CM | POA: Diagnosis not present

## 2019-08-14 DIAGNOSIS — G4733 Obstructive sleep apnea (adult) (pediatric): Secondary | ICD-10-CM | POA: Diagnosis not present

## 2019-08-23 ENCOUNTER — Encounter: Payer: Self-pay | Admitting: Family Medicine

## 2019-08-30 ENCOUNTER — Ambulatory Visit: Payer: Self-pay | Attending: Internal Medicine

## 2019-08-30 ENCOUNTER — Other Ambulatory Visit: Payer: Self-pay

## 2019-08-30 DIAGNOSIS — Z23 Encounter for immunization: Secondary | ICD-10-CM

## 2019-08-30 NOTE — Progress Notes (Signed)
   [id number]  Name:  [name] [name].    MRN: [id number] DOB: [birth year]  08/30/2019  [name] was observed post Covid-19 immunization for 15 minutes without incident. He was provided with Vaccine Information Sheet and instruction to access the V-Safe system.   [name] was instructed to call 911 with any severe reactions post vaccine: Marland Kitchen Difficulty breathing  . Swelling of face and throat  . A fast heartbeat  . A bad rash all over body  . Dizziness and weakness   Immunizations Administered    Name Date Dose VIS Date Route   Pfizer COVID-19 Vaccine 08/30/2019  8:27 AM 0.3 mL 05/31/2019 Intramuscular   Manufacturer: [employer], [employer]   Lot: [id number]   NDC: [address]-4

## 2019-09-10 ENCOUNTER — Telehealth: Payer: Self-pay | Admitting: Medical

## 2019-09-10 ENCOUNTER — Ambulatory Visit: Payer: Self-pay

## 2019-09-25 ENCOUNTER — Ambulatory Visit: Payer: Self-pay | Attending: Internal Medicine

## 2019-09-25 ENCOUNTER — Ambulatory Visit: Payer: Self-pay

## 2019-09-25 DIAGNOSIS — Z23 Encounter for immunization: Secondary | ICD-10-CM

## 2019-09-25 NOTE — Progress Notes (Signed)
   [id number]  Name:  Kazimir Hartnett.    MRN: [id number] DOB: Sep 19, 1975  09/25/2019  Mr. Marcil was observed post Covid-19 immunization for 15 minutes without incident. He was provided with Vaccine Information Sheet and instruction to access the V-Safe system.   Mr. Klippel was instructed to call 911 with any severe reactions post vaccine: Marland Kitchen Difficulty breathing  . Swelling of face and throat  . A fast heartbeat  . A bad rash all over body  . Dizziness and weakness   Immunizations Administered    Name Date Dose VIS Date Route   Pfizer COVID-19 Vaccine 09/25/2019  8:34 AM 0.3 mL 05/31/2019 Intramuscular   Manufacturer: [employer], [employer]   Lot: [id number]   NDC: [address]-4

## 2019-10-31 ENCOUNTER — Other Ambulatory Visit: Payer: Self-pay | Admitting: Family Medicine

## 2019-10-31 DIAGNOSIS — F320 Major depressive disorder, single episode, mild: Secondary | ICD-10-CM

## 2019-10-31 DIAGNOSIS — F32A Depression, unspecified: Secondary | ICD-10-CM

## 2019-10-31 DIAGNOSIS — F419 Anxiety disorder, unspecified: Secondary | ICD-10-CM

## 2019-10-31 NOTE — Telephone Encounter (Signed)
Requested Prescriptions  Pending Prescriptions Disp Refills  . sertraline (ZOLOFT) 100 MG tablet [Pharmacy Med Name: SERTRALINE HCL 100 MG TABLET] 30 tablet 0    Sig: TAKE 1 TABLET BY MOUTH EVERY DAY     Psychiatry:  Antidepressants - SSRI Failed - 10/31/2019  8:32 AM      Failed - Completed PHQ-2 or PHQ-9 in the last 360 days.      Failed - Valid encounter within last 6 months    Recent Outpatient Visits          7 months ago Cough   [facility] [place], Marzella Schlein, MD   1 year ago Acute right-sided back pain with sciatica   [facility] Chrismon, Jodell Cipro, [place]   1 year ago Need for influenza vaccination   [facility] Maple Hudson., MD   2 years ago Periorbital cellulitis of right eye   [facility] Maple Hudson., MD   2 years ago Rhus dermatitis   [facility] Maple Hudson., MD              Phone call to pt. To make aware that office f/u is needed to continue to receive refills.  Sched. Virtual Appt. On 11/04/19 @ 4:00 PM, due to c/o intermittent SOB, since having COVID in November 2020.  Courtesy refill of # 30 given at this time.

## 2019-11-04 ENCOUNTER — Ambulatory Visit (INDEPENDENT_AMBULATORY_CARE_PROVIDER_SITE_OTHER): Payer: BC Managed Care – PPO | Admitting: Family Medicine

## 2019-11-04 ENCOUNTER — Encounter: Payer: Self-pay | Admitting: Family Medicine

## 2019-11-04 DIAGNOSIS — F320 Major depressive disorder, single episode, mild: Secondary | ICD-10-CM | POA: Diagnosis not present

## 2019-11-04 DIAGNOSIS — F32A Depression, unspecified: Secondary | ICD-10-CM

## 2019-11-04 DIAGNOSIS — F419 Anxiety disorder, unspecified: Secondary | ICD-10-CM | POA: Diagnosis not present

## 2019-11-04 DIAGNOSIS — G4733 Obstructive sleep apnea (adult) (pediatric): Secondary | ICD-10-CM

## 2019-11-04 MED ORDER — SERTRALINE HCL 100 MG PO TABS: 100.0000 mg | ORAL_TABLET | Freq: Every day | ORAL | 4 refills | Status: DC

## 2019-11-04 NOTE — Progress Notes (Signed)
Virtual telephone visit    Virtual Visit via Telephone Note   This visit type was conducted due to national recommendations for restrictions regarding the COVID-19 Pandemic (e.g. social distancing) in an effort to limit this patient's exposure and mitigate transmission in our community. Due to his co-morbid illnesses, this patient is at least at moderate risk for complications without adequate follow up. This format is felt to be most appropriate for this patient at this time. The patient did not have access to video technology or had technical difficulties with video requiring transitioning to audio format only (telephone). Physical exam was limited to content and character of the telephone converstion.    Patient location: home  Provider location: office   Visit Date: 11/04/2019  Today's healthcare provider: [name], MD   Chief Complaint  Patient presents with  . Follow-up   Subjective    HPI Virtual phone visit have not seen patient in the office since the fall 2019.  He states when he called for an appointment I told him he cannot be seen because he was still short of breath from Covid in the past fall.  He is having no new symptoms.  He is otherwise doing well.  He states he feels better and his wife states he definitely is better when he takes his sertraline. He is  using his CPAP at night.  Past Medical History:  Diagnosis Date  . Depressed   . Depression   . GERD (gastroesophageal reflux disease)   . Headache(784.0)   . Hypertension   . Sleep apnea    cpap      Medications: Outpatient Medications Prior to Visit  Medication Sig  . diazepam (VALIUM) 5 MG tablet TAKE 1 TABLET (5 MG TOTAL) BY MOUTH EVERY 12 (TWELVE) HOURS AS NEEDED FOR ANXIETY.  Marland Kitchen EPINEPHRINE, ANAPHYLAXIS THERAPY AGENTS, EPIPEN 2-PAK, 0.3MG /0.3ML (Injection Device)  1 Device as directed for 0 days  Quantity: 1;  Refills: 12   Ordered :05-Mar-2012  Janey Greaser ;  Started  05-Mar-2012 Active  . methocarbamol (ROBAXIN) 750 MG tablet Take 1 tablet (750 mg total) by mouth 4 (four) times daily.  . sertraline (ZOLOFT) 100 MG tablet TAKE 1 TABLET BY MOUTH EVERY DAY  . guaiFENesin-codeine 100-10 MG/5ML syrup Take 5 mLs by mouth every 6 (six) hours as needed for cough. (Patient not taking: Reported on 11/04/2019)   No facility-administered medications prior to visit.    Review of Systems  Constitutional: Negative for appetite change, chills and fever.  Respiratory: Negative for chest tightness, shortness of breath and wheezing.   Cardiovascular: Negative for chest pain and palpitations.  Gastrointestinal: Negative for abdominal pain, nausea and vomiting.       Objective    There were no vitals taken for this visit.      Assessment & Plan     1. Anxiety and depression Refill sertraline. - sertraline (ZOLOFT) 100 MG tablet; Take 1 tablet (100 mg total) by mouth daily.  Dispense: 30 tablet; Refill: 4  2. Obstructive sleep apnea syndrome Using CPAP nightly.  3. Depression, major, single episode, mild (HCC) Refill sertraline and I will see him back this summer for a physical.  He has not been seen for routine care since the fall 2019 - sertraline (ZOLOFT) 100 MG tablet; Take 1 tablet (100 mg total) by mouth daily.  Dispense: 30 tablet; Refill: 4   No follow-ups on file.    I discussed the assessment and treatment plan with the  patient. The patient was provided an opportunity to ask questions and all were answered. The patient agreed with the plan and demonstrated an understanding of the instructions.   The patient was advised to call back or seek an in-person evaluation if the symptoms worsen or if the condition fails to improve as anticipated.  I provided 10 minutes of non-face-to-face time during this encounter.    Annye Forrey Wendelyn Breslow, MD [facility] [phone] (phone) [phone] (fax)  [facility]

## 2019-12-10 ENCOUNTER — Other Ambulatory Visit: Payer: Self-pay | Admitting: Family Medicine

## 2019-12-10 DIAGNOSIS — F419 Anxiety disorder, unspecified: Secondary | ICD-10-CM

## 2019-12-10 DIAGNOSIS — F320 Major depressive disorder, single episode, mild: Secondary | ICD-10-CM

## 2019-12-10 DIAGNOSIS — F32A Depression, unspecified: Secondary | ICD-10-CM

## 2019-12-10 NOTE — Telephone Encounter (Signed)
90 day approved 

## 2020-01-08 DIAGNOSIS — W57XXXA Bitten or stung by nonvenomous insect and other nonvenomous arthropods, initial encounter: Secondary | ICD-10-CM | POA: Diagnosis not present

## 2020-01-08 DIAGNOSIS — M79641 Pain in right hand: Secondary | ICD-10-CM | POA: Diagnosis not present

## 2020-01-08 DIAGNOSIS — S60561A Insect bite (nonvenomous) of right hand, initial encounter: Secondary | ICD-10-CM | POA: Diagnosis not present

## 2020-02-19 ENCOUNTER — Other Ambulatory Visit: Payer: Self-pay | Admitting: Family Medicine

## 2020-02-19 DIAGNOSIS — F32A Depression, unspecified: Secondary | ICD-10-CM

## 2020-02-19 DIAGNOSIS — F320 Major depressive disorder, single episode, mild: Secondary | ICD-10-CM

## 2020-02-19 DIAGNOSIS — F419 Anxiety disorder, unspecified: Secondary | ICD-10-CM

## 2020-05-18 NOTE — Progress Notes (Signed)
I,[name],acting as a scribe for [name], MD.,have documented all relevant documentation on the behalf of [name], MD,as directed by  [name], MD while in the presence of [name], MD.   Complete physical exam   Patient: [name] [name].   DOB: 12-Apr-1976   44 y.o. Male  MRN: [id number] Visit Date: 05/19/2020  Today's healthcare provider: [name], MD   Chief Complaint  Patient presents with  . Annual Exam   Subjective    [name] [name]. is a 44 y.o. male who presents today for a complete physical exam.  He reports consuming a general diet. Home exercise routine includes walking. He generally feels fairly well. He reports sleeping fairly well. He does not have additional problems to discuss today.  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  Had Covid a year ago now and is walking somewhat work but no getting no regular exercise.  Overall he feels fairly well. HPI    Past Medical History:  Diagnosis Date  . Depressed   . Depression   . GERD (gastroesophageal reflux disease)   . Headache(784.0)   . Hypertension   . Sleep apnea    cpap   Past Surgical History:  Procedure Laterality Date  . BACK SURGERY    . LUMBAR DISC SURGERY    . LUMBAR DISC SURGERY     bulging disc repaired  . LUMBAR SPINE SURGERY  11/08/2011  . LUMBAR WOUND DEBRIDEMENT  11/23/2011   Procedure: LUMBAR WOUND DEBRIDEMENT;  Surgeon: Karn Cassis, MD;  Location: [facility];  Service: Neurosurgery;  Laterality: N/A;  Incision and drainage of lumbar wound  . spine     spine spurs cleaned up and arthritis   Social History   Socioeconomic History  . Marital status: Married    Spouse name: Not on file  . Number of children: Not on file  . Years of education: Not on file  . Highest education level: Not on file  Occupational History  . Not on file  Tobacco Use  . Smoking status: Former Smoker    Packs/day: 0.30     Years: 3.00    Pack years: 0.90    Types: Cigarettes    Quit date: 08/09/1992    Years since quitting: 27.7  . Smokeless tobacco: Current User    Types: Chew  Substance and Sexual Activity  . Alcohol use: Yes    Alcohol/week: 3.0 - 4.0 standard drinks    Types: 3 - 4 Cans of beer per week    Comment: occasional  . Drug use: No  . Sexual activity: Yes  Other Topics Concern  . Not on file  Social History Narrative  . Not on file   Social Determinants of Health   Financial Resource Strain:   . Difficulty of Paying Living Expenses: Not on file  Food Insecurity:   . Worried About Programme researcher, broadcasting/film/video in the Last Year: Not on file  . Ran Out of Food in the Last Year: Not on file  Transportation Needs:   . Lack of Transportation (Medical): Not on file  . Lack of Transportation (Non-Medical): Not on file  Physical Activity:   . Days of Exercise per Week: Not on file  . Minutes of Exercise per Session: Not on file  Stress:   . Feeling of Stress : Not on file  Social Connections:   . Frequency of Communication  with Friends and Family: Not on file  . Frequency of Social Gatherings with Friends and Family: Not on file  . Attends Religious Services: Not on file  . Active Member of Clubs or Organizations: Not on file  . Attends Banker Meetings: Not on file  . Marital Status: Not on file  Intimate Partner Violence:   . Fear of Current or Ex-Partner: Not on file  . Emotionally Abused: Not on file  . Physically Abused: Not on file  . Sexually Abused: Not on file   Family Status  Relation Name Status  . Mother  Alive  . Father  Alive  . Other  (Not Specified)  . MGM  (Not Specified)  . MGF  (Not Specified)   Family History  Problem Relation Age of Onset  . Diabetes Mother   . Hypertension Mother   . Hyperlipidemia Mother   . CAD Other   . Irregular heart beat Maternal Grandmother   . Heart attack Maternal Grandfather    Allergies  Allergen Reactions  .  Penicillin G Anaphylaxis  . Penicillins Anaphylaxis  . Shellfish Allergy Anaphylaxis  . Yellow Jacket Venom Shortness Of Breath    Patient Care Team: Maple Hudson., MD as PCP - General (Unknown Physician Specialty)   Medications: Outpatient Medications Prior to Visit  Medication Sig  . cetirizine (ZYRTEC) 10 MG tablet Take 10 mg by mouth daily.  Marland Kitchen EPINEPHRINE, ANAPHYLAXIS THERAPY AGENTS, EPIPEN 2-PAK, 0.3MG /0.3ML (Injection Device)  1 Device as directed for 0 days  Quantity: 1;  Refills: 12   Ordered :05-Mar-2012  [name] [name] ;  Started 05-Mar-2012 Active  . sertraline (ZOLOFT) 100 MG tablet TAKE 1 TABLET BY MOUTH EVERY DAY  . diazepam (VALIUM) 5 MG tablet TAKE 1 TABLET (5 MG TOTAL) BY MOUTH EVERY 12 (TWELVE) HOURS AS NEEDED FOR ANXIETY. (Patient not taking: Reported on 05/19/2020)  . guaiFENesin-codeine 100-10 MG/5ML syrup Take 5 mLs by mouth every 6 (six) hours as needed for cough. (Patient not taking: Reported on 11/04/2019)  . methocarbamol (ROBAXIN) 750 MG tablet Take 1 tablet (750 mg total) by mouth 4 (four) times daily. (Patient not taking: Reported on 05/19/2020)   No facility-administered medications prior to visit.    Review of Systems  Musculoskeletal: Positive for arthralgias.  All other systems reviewed and are negative.      Objective    BP (!) 150/96 (BP Location: Left Arm, Patient Position: Sitting, Cuff Size: Large)   Pulse 90   Temp 98.6 F (37 C) (Oral)   Resp 16   Ht 6' (1.829 m)   Wt (!) 315 lb (142.9 kg)   SpO2 99%   BMI 42.72 kg/m  BP Readings from Last 3 Encounters:  05/19/20 (!) 150/96  05/27/19 131/73  12/18/18 134/78   Wt Readings from Last 3 Encounters:  05/19/20 (!) 315 lb (142.9 kg)  05/27/19 290 lb (131.5 kg)  12/18/18 280 lb (127 kg)      Physical Exam Vitals reviewed.  Constitutional:      Appearance: He is well-developed.     Comments: Pleasant, cooperative, morbidly obese white male in no acute distress.   HENT:     Head: Normocephalic and atraumatic.     Right Ear: External ear normal.     Left Ear: External ear normal.     Nose: Nose normal.  Eyes:     Conjunctiva/sclera: Conjunctivae normal.     Pupils: Pupils are equal, round, and reactive to light.  Cardiovascular:     Rate and Rhythm: Normal rate and regular rhythm.     Heart sounds: Normal heart sounds.  Pulmonary:     Effort: Pulmonary effort is normal.     Breath sounds: Normal breath sounds.  Abdominal:     General: Bowel sounds are normal.     Palpations: Abdomen is soft.  Genitourinary:    Penis: Normal.      Prostate: Normal.     Rectum: Normal.  Musculoskeletal:        General: Normal range of motion.     Cervical back: Normal range of motion and neck supple.  Skin:    General: Skin is warm and dry.  Neurological:     General: No focal deficit present.     Mental Status: He is alert and oriented to person, place, and time.  Psychiatric:        Mood and Affect: Mood normal.        Behavior: Behavior normal.        Thought Content: Thought content normal.        Judgment: Judgment normal.       Last depression screening scores PHQ 2/9 Scores 05/19/2020 03/02/2017 10/27/2016  PHQ - 2 Score 0 0 0  PHQ- 9 Score 1 0 0   Last fall risk screening Fall Risk  05/17/2016  Falls in the past year? Yes  Number falls in past yr: 1  Injury with Fall? No   Last Audit-C alcohol use screening Alcohol Use Disorder Test (AUDIT) 05/19/2020  1. How often do you have a drink containing alcohol? 1  2. How many drinks containing alcohol do you have on a typical day when you are drinking? 1  3. How often do you have six or more drinks on one occasion? 0  AUDIT-C Score 2  Alcohol Brief Interventions/Follow-up AUDIT Score <7 follow-up not indicated   A score of 3 or more in women, and 4 or more in men indicates increased risk for alcohol abuse, EXCEPT if all of the points are from question 1   No results found for any visits  on 05/19/20.  Assessment & Plan    Routine Health Maintenance and Physical Exam  Exercise Activities and Dietary recommendations Goals   None     Immunization History  Administered Date(s) Administered  . Influenza,inj,Quad PF,6+ Mos 04/10/2018, 04/02/2019  . Influenza-Unspecified 02/28/2017, 03/11/2020  . PFIZER SARS-COV-2 Vaccination 08/30/2019, 09/25/2019, 05/19/2020  . Tdap 07/03/2012    Health Maintenance  Topic Date Due  . Hepatitis C Screening  Never done  . HIV Screening  Never done  . TETANUS/TDAP  07/03/2022  . INFLUENZA VACCINE  Completed  . COVID-19 Vaccine  Completed    Discussed health benefits of physical activity, and encouraged him to engage in regular exercise appropriate for his age and condition.  1. Annual physical exam  - Lipid panel - TSH - CBC w/Diff/Platelet - Comprehensive Metabolic Panel (CMET)  2. Essential (primary) hypertension   3. Screening for prostate cancer  - PSA  4. Screening for blood or protein in urine  - POCT urinalysis dipstick--Normal  5. Encounter for immunization  - [employer] Vaccine  6. Encounter for screening fecal occult blood testing  - IFOBT POC (occult bld, rslt in office)--Negative    Return in about 4 months (around 09/16/2020).         [name] [name] Wendelyn Breslow, MD  [facility] [phone] (phone) [phone] (fax)  [facility]

## 2020-05-19 ENCOUNTER — Encounter: Payer: Self-pay | Admitting: Family Medicine

## 2020-05-19 ENCOUNTER — Other Ambulatory Visit: Payer: Self-pay

## 2020-05-19 ENCOUNTER — Ambulatory Visit (INDEPENDENT_AMBULATORY_CARE_PROVIDER_SITE_OTHER): Payer: BC Managed Care – PPO | Admitting: Family Medicine

## 2020-05-19 VITALS — BP 150/96 | HR 90 | Temp 98.6°F | Resp 16 | Ht 72.0 in | Wt 315.0 lb

## 2020-05-19 DIAGNOSIS — Z1389 Encounter for screening for other disorder: Secondary | ICD-10-CM | POA: Diagnosis not present

## 2020-05-19 DIAGNOSIS — Z0000 Encounter for general adult medical examination without abnormal findings: Secondary | ICD-10-CM

## 2020-05-19 DIAGNOSIS — I10 Essential (primary) hypertension: Secondary | ICD-10-CM

## 2020-05-19 DIAGNOSIS — F320 Major depressive disorder, single episode, mild: Secondary | ICD-10-CM | POA: Diagnosis not present

## 2020-05-19 DIAGNOSIS — Z23 Encounter for immunization: Secondary | ICD-10-CM

## 2020-05-19 DIAGNOSIS — Z125 Encounter for screening for malignant neoplasm of prostate: Secondary | ICD-10-CM

## 2020-05-19 DIAGNOSIS — Z1211 Encounter for screening for malignant neoplasm of colon: Secondary | ICD-10-CM

## 2020-05-19 DIAGNOSIS — E661 Drug-induced obesity: Secondary | ICD-10-CM | POA: Diagnosis not present

## 2020-05-19 DIAGNOSIS — E291 Testicular hypofunction: Secondary | ICD-10-CM | POA: Diagnosis not present

## 2020-05-19 LAB — POCT URINALYSIS DIPSTICK
Bilirubin, UA: NEGATIVE
Blood, UA: NEGATIVE
Glucose, UA: NEGATIVE
Ketones, UA: NEGATIVE
Leukocytes, UA: NEGATIVE
Nitrite, UA: NEGATIVE
Protein, UA: NEGATIVE
Spec Grav, UA: 1.02 (ref 1.010–1.025)
Urobilinogen, UA: 0.2 E.U./dL
pH, UA: 6 (ref 5.0–8.0)

## 2020-05-19 LAB — IFOBT (OCCULT BLOOD): IFOBT: NEGATIVE

## 2020-05-20 LAB — COMPREHENSIVE METABOLIC PANEL
ALT: 80 IU/L — ABNORMAL HIGH (ref 0–44)
AST: 81 IU/L — ABNORMAL HIGH (ref 0–40)
Albumin/Globulin Ratio: 1.5 (ref 1.2–2.2)
Albumin: 4.5 g/dL (ref 4.0–5.0)
Alkaline Phosphatase: 82 IU/L (ref 44–121)
BUN/Creatinine Ratio: 18 (ref 9–20)
BUN: 16 mg/dL (ref 6–24)
Bilirubin Total: 0.5 mg/dL (ref 0.0–1.2)
CO2: 22 mmol/L (ref 20–29)
Calcium: 9.4 mg/dL (ref 8.7–10.2)
Chloride: 98 mmol/L (ref 96–106)
Creatinine, Ser: 0.89 mg/dL (ref 0.76–1.27)
GFR calc Af Amer: 119 mL/min/{1.73_m2} (ref >59)
GFR calc non Af Amer: 103 mL/min/{1.73_m2} (ref >59)
Globulin, Total: 3.1 g/dL (ref 1.5–4.5)
Glucose: 168 mg/dL — ABNORMAL HIGH (ref 65–99)
Potassium: 4.4 mmol/L (ref 3.5–5.2)
Sodium: 137 mmol/L (ref 134–144)
Total Protein: 7.6 g/dL (ref 6.0–8.5)

## 2020-05-20 LAB — CBC WITH DIFFERENTIAL/PLATELET
Basophils Absolute: 0.1 10*3/uL (ref 0.0–0.2)
Basos: 1 %
EOS (ABSOLUTE): 0.1 10*3/uL (ref 0.0–0.4)
Eos: 1 %
Hematocrit: 42.2 % (ref 37.5–51.0)
Hemoglobin: 14.7 g/dL (ref 13.0–17.7)
Immature Grans (Abs): 0 10*3/uL (ref 0.0–0.1)
Immature Granulocytes: 0 %
Lymphocytes Absolute: 1.9 10*3/uL (ref 0.7–3.1)
Lymphs: 22 %
MCH: 31.3 pg (ref 26.6–33.0)
MCHC: 34.8 g/dL (ref 31.5–35.7)
MCV: 90 fL (ref 79–97)
Monocytes Absolute: 0.7 10*3/uL (ref 0.1–0.9)
Monocytes: 8 %
Neutrophils Absolute: 6 10*3/uL (ref 1.4–7.0)
Neutrophils: 68 %
Platelets: 273 10*3/uL (ref 150–450)
RBC: 4.69 x10E6/uL (ref 4.14–5.80)
RDW: 12.9 % (ref 11.6–15.4)
WBC: 8.7 10*3/uL (ref 3.4–10.8)

## 2020-05-20 LAB — LIPID PANEL
Chol/HDL Ratio: 4.5 ratio (ref 0.0–5.0)
Cholesterol, Total: 220 mg/dL — ABNORMAL HIGH (ref 100–199)
HDL: 49 mg/dL (ref >39)
LDL Chol Calc (NIH): 152 mg/dL — ABNORMAL HIGH (ref 0–99)
Triglycerides: 107 mg/dL (ref 0–149)
VLDL Cholesterol Cal: 19 mg/dL (ref 5–40)

## 2020-05-20 LAB — TSH: TSH: 1.68 u[IU]/mL (ref 0.450–4.500)

## 2020-05-20 LAB — PSA: Prostate Specific Ag, Serum: 0.5 ng/mL (ref 0.0–4.0)

## 2020-05-26 ENCOUNTER — Telehealth: Payer: Self-pay

## 2020-05-26 NOTE — Telephone Encounter (Signed)
-----   Message from Maple Hudson., MD sent at 05/22/2020  8:01 AM EST ----- Lab work shows patient now diabetic.  Liver functions mildly elevated due to excess weight.  "Fatty liver".  Work on diet and exercise and follow-up in 1 month for an A1c

## 2020-05-26 NOTE — Telephone Encounter (Signed)
Pt advised, apt scheduled for 07/01/2020 at 8:40am  Thanks,   -Vernona Rieger

## 2020-07-01 ENCOUNTER — Other Ambulatory Visit: Payer: Self-pay

## 2020-07-01 ENCOUNTER — Ambulatory Visit: Payer: BC Managed Care – PPO | Admitting: Family Medicine

## 2020-07-01 ENCOUNTER — Encounter: Payer: Self-pay | Admitting: Family Medicine

## 2020-07-01 VITALS — BP 134/97 | HR 95 | Temp 98.2°F | Resp 15 | Wt 311.0 lb

## 2020-07-01 DIAGNOSIS — G4733 Obstructive sleep apnea (adult) (pediatric): Secondary | ICD-10-CM | POA: Diagnosis not present

## 2020-07-01 DIAGNOSIS — F32A Depression, unspecified: Secondary | ICD-10-CM

## 2020-07-01 DIAGNOSIS — F419 Anxiety disorder, unspecified: Secondary | ICD-10-CM

## 2020-07-01 DIAGNOSIS — I10 Essential (primary) hypertension: Secondary | ICD-10-CM

## 2020-07-01 DIAGNOSIS — E118 Type 2 diabetes mellitus with unspecified complications: Secondary | ICD-10-CM

## 2020-07-01 DIAGNOSIS — Z6841 Body Mass Index (BMI) 40.0 and over, adult: Secondary | ICD-10-CM

## 2020-07-01 LAB — POCT GLYCOSYLATED HEMOGLOBIN (HGB A1C): Hemoglobin A1C: 8 % — AB (ref 4.0–5.6)

## 2020-07-01 MED ORDER — METFORMIN HCL 1000 MG PO TABS: 1000.0000 mg | ORAL_TABLET | Freq: Every day | ORAL | 3 refills | Status: DC

## 2020-07-01 NOTE — Progress Notes (Signed)
Established patient visit   Patient: [name] [name].   DOB: 19-Jun-1976   45 y.o. Male  MRN: [id number] Visit Date: 07/01/2020  Today's healthcare provider: [name], MD   Chief Complaint  Patient presents with  . Diabetes   Subjective    HPI  Patient comes in today for follow-up.  He has not been seen in some time. Diabetes Mellitus Type II, Follow-up  Lab Results  Component Value Date   HGBA1C 8.0 (A) 07/01/2020   HGBA1C 5.7 (H) 01/02/2017   HGBA1C 5.7 10/27/2016   Wt Readings from Last 3 Encounters:  07/01/20 (!) 311 lb (141.1 kg)  05/19/20 (!) 315 lb (142.9 kg)  05/27/19 290 lb (131.5 kg)   Last seen for diabetes 2 months ago.  Management  since then includes patient encouraged to work on diet.Patient states that he has stopped eating late night and states that he has stopped drinking carbonated beverages. Symptoms: No fatigue No foot ulcerations  No appetite changes No nausea  No paresthesia of the feet  Yes polydipsia  No polyuria No visual disturbances   No vomiting     Home blood sugar records: not being checked  Episodes of hypoglycemia? No     Current insulin regiment: none Most Recent Eye Exam: Patient reports that he has been to [facility]  Current exercise: none Current diet habits: well balanced  Pertinent Labs: Lab Results  Component Value Date   CHOL 220 (H) 05/19/2020   HDL 49 05/19/2020   LDLCALC 152 (H) 05/19/2020   TRIG 107 05/19/2020   CHOLHDL 4.5 05/19/2020   Lab Results  Component Value Date   NA 137 05/19/2020   K 4.4 05/19/2020   CREATININE 0.89 05/19/2020   GFRNONAA 103 05/19/2020   GFRAA 119 05/19/2020   GLUCOSE 168 (H) 05/19/2020     ---------------------------------------------------------------------------------------------------  Patient Active Problem List   Diagnosis Date Noted  . Seasonal affective disorder (HCC) 06/27/2016  . Anxiety and depression 12/27/2014  . Allergic rhinitis  12/27/2014  . Depression, major, single episode, mild (HCC) 12/27/2014  . Essential (primary) hypertension 12/27/2014  . Esophageal reflux 12/27/2014  . 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 Apnea, sleep 12/27/2014  . Avitaminosis D 12/27/2014  . Chest pain, central 12/23/2014   Past Medical History:  Diagnosis Date  . Depressed   . Depression   . GERD (gastroesophageal reflux disease)   . Headache(784.0)   . Hypertension   . Sleep apnea    cpap   Allergies  Allergen Reactions  . Penicillin G Anaphylaxis  . Penicillins Anaphylaxis  . Shellfish Allergy Anaphylaxis  . [place] Venom Shortness Of Breath       Medications: Outpatient Medications Prior to Visit  Medication Sig  . cetirizine (ZYRTEC) 10 MG tablet Take 10 mg by mouth daily.  . diazepam (VALIUM) 5 MG tablet TAKE 1 TABLET (5 MG TOTAL) BY MOUTH EVERY 12 (TWELVE) HOURS AS NEEDED FOR ANXIETY.  Marland Kitchen EPINEPHRINE, ANAPHYLAXIS THERAPY AGENTS, EPIPEN 2-PAK, 0.3MG /0.3ML (Injection Device)  1 Device as directed for 0 days  Quantity: 1;  Refills: 12   Ordered :05-Mar-2012  Janey Greaser ;  Started 05-Mar-2012 Active  . guaiFENesin-codeine 100-10 MG/5ML syrup Take 5 mLs by mouth every 6 (six) hours as needed for cough.  . methocarbamol (ROBAXIN) 750 MG tablet Take 1 tablet (750 mg total) by mouth 4 (four) times daily.  . sertraline (ZOLOFT) 100 MG tablet TAKE 1 TABLET  BY MOUTH EVERY DAY   No facility-administered medications prior to visit.    Review of Systems  Last metabolic panel Lab Results  Component Value Date   GLUCOSE 168 (H) 05/19/2020   NA 137 05/19/2020   K 4.4 05/19/2020   CL 98 05/19/2020   CO2 22 05/19/2020   BUN 16 05/19/2020   CREATININE 0.89 05/19/2020   GFRNONAA 103 05/19/2020   GFRAA 119 05/19/2020   CALCIUM 9.4 05/19/2020   PROT 7.6 05/19/2020   ALBUMIN 4.5 05/19/2020   LABGLOB 3.1 05/19/2020   AGRATIO 1.5 05/19/2020   BILITOT 0.5 05/19/2020   ALKPHOS 82  05/19/2020   AST 81 (H) 05/19/2020   ALT 80 (H) 05/19/2020   ANIONGAP 7 05/27/2019   Last lipids Lab Results  Component Value Date   CHOL 220 (H) 05/19/2020   HDL 49 05/19/2020   LDLCALC 152 (H) 05/19/2020   TRIG 107 05/19/2020   CHOLHDL 4.5 05/19/2020      Objective    BP (!) 134/97   Pulse 95   Temp 98.2 F (36.8 C) (Oral)   Resp 15   Wt (!) 311 lb (141.1 kg)   SpO2 98%   BMI 42.18 kg/m  Wt Readings from Last 3 Encounters:  07/01/20 (!) 311 lb (141.1 kg)  05/19/20 (!) 315 lb (142.9 kg)  05/27/19 290 lb (131.5 kg)      Physical Exam Vitals reviewed.  Constitutional:      Appearance: He is well-developed.  HENT:     Head: Normocephalic and atraumatic.     Right Ear: External ear normal.     Left Ear: External ear normal.     Nose: Nose normal.  Eyes:     General: No scleral icterus.    Conjunctiva/sclera: Conjunctivae normal.  Neck:     Thyroid: No thyromegaly.  Cardiovascular:     Rate and Rhythm: Normal rate and regular rhythm.     Heart sounds: Normal heart sounds.  Pulmonary:     Effort: Pulmonary effort is normal.     Breath sounds: Normal breath sounds.  Abdominal:     Palpations: Abdomen is soft.  Skin:    General: Skin is warm and dry.  Neurological:     General: No focal deficit present.     Mental Status: He is alert and oriented to person, place, and time.     Comments: Diabetic foot exam today is normal.  Psychiatric:        Mood and Affect: Mood normal.        Behavior: Behavior normal.        Thought Content: Thought content normal.        Judgment: Judgment normal.        Results for orders placed or performed in visit on 07/01/20  POCT glycosylated hemoglobin (Hb A1C)  Result Value Ref Range   Hemoglobin A1C 8.0 (A) 4.0 - 5.6 %   HbA1c POC (<> result, manual entry)     HbA1c, POC (prediabetic range)     HbA1c, POC (controlled diabetic range)      Assessment & Plan     1. Controlled type 2 diabetes mellitus with  complication, without long-term current use of insulin (HCC) 1C is 8.0 today.  Start metformin 1000 mg before breakfast.  Follow-up 3 months. - POCT glycosylated hemoglobin (Hb A1C) - metFORMIN (GLUCOPHAGE) 1000 MG tablet; Take 1 tablet (1,000 mg total) by mouth daily with breakfast.  Dispense: 90 tablet; Refill: 3  2. Essential (primary) hypertension Exercise and follow-up on next visit.  Goal less than 130/8  3. Obstructive sleep apnea syndrome On CPAP  4. Anxiety and depression Clinically stable  5. Class 3 severe obesity due to excess calories with serious comorbidity and body mass index (BMI) of 40.0 to 44.9 in adult [facility]) Diet and exercise stressed.    No follow-ups on file.      I, [name], MD, have reviewed all documentation for this visit. The documentation on 07/10/20 for the exam, diagnosis, procedures, and orders are all accurate and complete.    Buzz Axel Wendelyn Breslow, MD  [facility] [phone] (phone) [phone] (fax)  [facility]

## 2020-07-01 NOTE — Patient Instructions (Addendum)
Type 2 Diabetes Mellitus, Self-Care, Adult When you have type 2 diabetes (type 2 diabetes mellitus), you must make sure your blood sugar (glucose) stays in a healthy range. You can do this with:  Nutrition.  Exercise.  Lifestyle changes.  Medicines or insulin, if needed.  Support from your doctors and others. What are the risks? Having diabetes can raise your risk for other long-term (chronic) health problems. You may get medicines to help prevent these problems. How to stay aware of blood sugar  Check your blood sugar level every day, as often as told.  Have your A1C (hemoglobin A1C) level checked two or more times a year. Have it checked more often if told.  Your doctor will set personal treatment goals for you. In general, you should have these blood sugar levels: ? Before meals: 80-130 mg/dL (4.4-7.2 mmol/L). ? After meals: below 180 mg/dL (10 mmol/L). ? A1C: less than 7%.   How to manage high and low blood sugar Symptoms of high blood sugar High blood sugar is also called hyperglycemia. Know the symptoms of high blood sugar. These may include:  More thirst.  Hunger.  Feeling very tired.  Needing to pee (urinate) more often than normal.  Seeing things blurry. Symptoms of low blood sugar Low blood sugar is also called hypoglycemia. This is when blood sugar is at or below 70 mg/dL (3.9 mmol/L). Symptoms may include:  Hunger.  Feeling worried or nervous (anxious).  Feeling sweaty and cold to the touch (clammy).  Being dizzy or light-headed.  Feeling sleepy.  A fast heartbeat.  Feeling grouchy (irritable).  Tingling or loss of feeling (numbness) around your mouth, lips, or tongue.  Restless sleep. Diabetes medicines can cause low blood sugar. You are more at risk:  While you exercise.  After exercise.  During sleep.  When you are sick.  When you skip meals or do not eat for a long time. Treating low blood sugar If you think you have low blood  sugar, eat or drink something sugary right away. Keep 15 grams of a fast-acting carb (carbohydrate) with you all the time. Make sure your family and friends know how to treat you if you cannot treat yourself. Treating very low blood sugar Severe hypoglycemia is when your blood sugar is at or below 54 mg/dL (3 mmol/L). Severe hypoglycemia is an emergency. Do not wait to see if the symptoms will go away. Get medical help right away. Call your local emergency services (911 in the [place] Do not drive yourself to the hospital. You may need a glucagon shot if you have very low blood sugar and you cannot eat or drink. Have a family member or friend learn how to check your blood sugar and how to give you a glucagon shot. Ask your doctor if you should have a kit for glucagon shots. Follow these instructions at home: Medicines  Take diabetes medicines as told. If your doctor prescribed insulin or diabetes medicines, take them each day.  Do not run out of insulin or other medicines. Plan ahead.  If you use insulin, change the amount you take based on how active you are and what foods you eat. Your doctor will tell you how to do this.  Take over-the-counter and prescription medicines only as told by your doctor. Eating and drinking  Eat healthy foods. These include: ? Low-fat (lean) proteins. ? Complex carbs, such as whole grains. ? Fresh fruits and vegetables. ? Low-fat dairy products. ? Healthy fats.  Meet with   a food expert (dietitian) to make an eating plan.  Follow instructions from your doctor about what you cannot eat or drink.  Drink enough fluid to keep your pee (urine) pale yellow.  Keep track of carbs that you eat. Read food labels and learn serving sizes of foods.  Follow your sick-day plan when you cannot eat or drink as normal. Make this plan with your doctor so it is ready to use.   Activity  Exercise as told by your doctor. You may need to: ? Do stretching and strength  exercises 2 or more times a week. ? Do 150 minutes or more of exercise each week that makes your heart beat faster and makes you sweat.  Spread out your exercise over 3 or more days a week.  Do not go more than 2 days in a row without exercise.  Talk with your doctor before you start a new exercise. Your doctor may tell you to change: ? How much insulin or medicines you take. ? How much food you eat. Lifestyle  Do not use any products that contain nicotine or tobacco, such as cigarettes, e-cigarettes, and chewing tobacco. If you need help quitting, ask your doctor.  If you drink alcohol and your doctor says alcohol is safe for you: ? Limit how much you use to:  0-1 drink a day for women who are not pregnant.  0-2 drinks a day for men. ? Be aware of how much alcohol is in your drink. In the [place] one drink equals one 12 oz bottle of beer (355 mL), one 5 oz glass of wine (148 mL), or one 1 oz glass of hard liquor (44 mL).  Learn to deal with stress. If you need help, ask your doctor. Body care  Stay up to date with your shots (immunizations).  Have your eyes and feet checked by a doctor as often as told.  Check your skin and feet every day. Check for cuts, bruises, redness, blisters, or sores.  Brush your teeth and gums two times a day. Floss one or more times a day.  Go to the dentist one or more times every 6 months.  Stay at a healthy weight.   General instructions  Share your diabetes care plan with: ? Your work or school. ? People you live with.  Carry a card or wear jewelry that says you have diabetes.  Keep all follow-up visits as told by your doctor. This is important. Questions to ask your doctor  Do I need to meet with a certified expert in diabetes education and care?  Where can I find a support group? Where to find more information  American Diabetes Association: www.diabetes.org  American Association of Diabetes Care and Education Specialists:  www.diabeteseducator.org  International Diabetes Federation: MemberVerification.ca Summary  When you have type 2 diabetes, you must make sure your blood sugar (glucose) stays in a healthy range. You can do this with nutrition, exercise, medicines and insulin, and support from doctors and others.  Check your blood sugar every day, or as often as told.  Having diabetes can raise your risk for other long-term health problems. You may get medicines to help prevent these problems.  Share your diabetes management plan with people at work, school, and home.  Keep all follow-up visits as told by your doctor. This is important. This information is not intended to replace advice given to you by your health care provider. Make sure you discuss any questions you have with your health  care provider. Document Revised: 01/08/2020 Document Reviewed: 01/08/2020 Elsevier Patient Education  2021 [employer].   Metformin Tablets What is this medicine? METFORMIN (met FOR min) is used to treat type 2 diabetes. It helps to control blood sugar. Treatment is combined with diet and exercise. This medicine can be used alone or with other medicines for diabetes. This medicine may be used for other purposes; ask your health care provider or pharmacist if you have questions. COMMON BRAND NAME(S): Glucophage What should I tell my health care provider before I take this medicine? They need to know if you have any of these conditions:  anemia  dehydration  heart disease  frequently drink alcohol-containing beverages  kidney disease  liver disease  polycystic ovary syndrome  serious infection or injury  vomiting  an unusual or allergic reaction to metformin, other medicines, foods, dyes, or preservatives  pregnant or trying to get pregnant  breast-feeding How should I use this medicine? Take this medicine by mouth with a glass of water. Follow the directions on the prescription label. Take this medicine  with food. Take your medicine at regular intervals. Do not take your medicine more often than directed. Do not stop taking except on your doctor's advice. Talk to your pediatrician regarding the use of this medicine in children. While this drug may be prescribed for children as young as 11 years of age for selected conditions, precautions do apply. Overdosage: If you think you have taken too much of this medicine contact a poison control center or emergency room at once. NOTE: This medicine is only for you. Do not share this medicine with others. What if I miss a dose? If you miss a dose, take it as soon as you can. If it is almost time for your next dose, take only that dose. Do not take double or extra doses. What may interact with this medicine? Do not take this medicine with any of the following medications:  certain contrast medicines given before X-rays, CT scans, MRI, or other procedures  dofetilide This medicine may also interact with the following medications:  acetazolamide  alcohol  certain antivirals for HIV or hepatitis  certain medicines for blood pressure, heart disease, irregular heart beat  cimetidine  dichlorphenamide  digoxin  diuretics  male hormones, like estrogens or progestins and birth control pills  glycopyrrolate  isoniazid  lamotrigine  memantine  methazolamide  metoclopramide  midodrine  niacin  phenothiazines like chlorpromazine, mesoridazine, prochlorperazine, thioridazine  phenytoin  ranolazine  steroid medicines like prednisone or cortisone  stimulant medicines for attention disorders, weight loss, or to stay awake  thyroid medicines  topiramate  trospium  vandetanib  zonisamide This list may not describe all possible interactions. Give your health care provider a list of all the medicines, herbs, non-prescription drugs, or dietary supplements you use. Also tell them if you smoke, drink alcohol, or use illegal drugs.  Some items may interact with your medicine. What should I watch for while using this medicine? Visit your doctor or health care professional for regular checks on your progress. A test called the HbA1C (A1C) will be monitored. This is a simple blood test. It measures your blood sugar control over the last 2 to 3 months. You will receive this test every 3 to 6 months. Learn how to check your blood sugar. Learn the symptoms of low and high blood sugar and how to manage them. Always carry a quick-source of sugar with you in case you have symptoms of low  blood sugar. Examples include hard sugar candy or glucose tablets. Make sure others know that you can choke if you eat or drink when you develop serious symptoms of low blood sugar, such as seizures or unconsciousness. They must get medical help at once. Tell your doctor or health care professional if you have high blood sugar. You might need to change the dose of your medicine. If you are sick or exercising more than usual, you might need to change the dose of your medicine. Do not skip meals. Ask your doctor or health care professional if you should avoid alcohol. Many nonprescription cough and cold products contain sugar or alcohol. These can affect blood sugar. This medicine may cause ovulation in premenopausal women who do not have regular monthly periods. This may increase your chances of becoming pregnant. You should not take this medicine if you become pregnant or think you may be pregnant. Talk with your doctor or health care professional about your birth control options while taking this medicine. Contact your doctor or health care professional right away if you think you are pregnant. If you are going to need surgery, a MRI, CT scan, or other procedure, tell your doctor that you are taking this medicine. You may need to stop taking this medicine before the procedure. Wear a medical ID bracelet or chain, and carry a card that describes your disease  and details of your medicine and dosage times. This medicine may cause a decrease in folic acid and vitamin B12. You should make sure that you get enough vitamins while you are taking this medicine. Discuss the foods you eat and the vitamins you take with your health care professional. What side effects may I notice from receiving this medicine? Side effects that you should report to your doctor or health care professional as soon as possible:  allergic reactions like skin rash, itching or hives, swelling of the face, lips, or tongue  breathing problems  feeling faint or lightheaded, falls  muscle aches or pains  signs and symptoms of low blood sugar such as feeling anxious, confusion, dizziness, increased hunger, unusually weak or tired, sweating, shakiness, cold, irritable, headache, blurred vision, fast heartbeat, loss of consciousness  slow or irregular heartbeat  unusual stomach pain or discomfort  unusually tired or weak Side effects that usually do not require medical attention (report to your doctor or health care professional if they continue or are bothersome):  diarrhea  headache  heartburn  metallic taste in mouth  nausea  stomach gas, upset This list may not describe all possible side effects. Call your doctor for medical advice about side effects. You may report side effects to FDA at 1-800-FDA-1088. Where should I keep my medicine? Keep out of the reach of children. Store at room temperature between 15 and 30 degrees C (59 and 86 degrees F). Protect from moisture and light. Throw away any unused medicine after the expiration date. NOTE: This sheet is a summary. It may not cover all possible information. If you have questions about this medicine, talk to your doctor, pharmacist, or health care provider.  2021 Elsevier/Gold Standard (2020-05-03 10:29:07)

## 2020-07-29 ENCOUNTER — Other Ambulatory Visit: Payer: Self-pay | Admitting: Family Medicine

## 2020-07-29 DIAGNOSIS — F320 Major depressive disorder, single episode, mild: Secondary | ICD-10-CM

## 2020-07-29 DIAGNOSIS — F32A Depression, unspecified: Secondary | ICD-10-CM

## 2020-09-14 NOTE — Progress Notes (Incomplete)
Established patient visit   Patient: [name] [name].   DOB: March 26, 1976   44 y.o. Male  MRN: [id number] Visit Date: 09/15/2020  Today's healthcare provider: [name], MD   No chief complaint on file.  Subjective    HPI  Diabetes Mellitus Type II, follow-up  Lab Results  Component Value Date   HGBA1C 8.0 (A) 07/01/2020   HGBA1C 5.7 (H) 01/02/2017   HGBA1C 5.7 10/27/2016   Last seen for diabetes 2 months ago.  Management since then includes; Started metformin 1000 mg before breakfast.  Follow-up 3 months. He reports {excellent/good/fair/poor:19665} compliance with treatment. He {is/is not:21021397} having side effects. {document side effects if present:1}  Home blood sugar records: {diabetes glucometry results:16657}  Episodes of hypoglycemia? {Yes/No:20286} {enter details if yes:1}   Current insulin regiment: {***Type 'None' if not taking insulin                                                otherwise enter complete                                                 details of insulin regiment:1} Most Recent Eye Exam: ***  --------------------------------------------------------------------------------------------------- Hypertension, follow-up  BP Readings from Last 3 Encounters:  07/01/20 (!) 134/97  05/19/20 (!) 150/96  05/27/19 131/73   Wt Readings from Last 3 Encounters:  07/01/20 (!) 311 lb (141.1 kg)  05/19/20 (!) 315 lb (142.9 kg)  05/27/19 290 lb (131.5 kg)     He was last seen for hypertension {NUMBERS 1-12:18279} {days/wks/mos/yrs:310907} ago.  BP at that visit was ***. Management since that visit includes ***. He reports {excellent/good/fair/poor:19665} compliance with treatment. He {is/is not:9024} having side effects. {document side effects if present:1} He {is/is not:9024} exercising. He {is/is not:9024} adherent to low salt diet.   Outside blood pressures are {enter patient reported home BP, or 'not being checked':1}.  He  {does/does not:200015} smoke.  Use of agents associated with hypertension: {bp agents assoc with hypertension:511::"none"}.   ---------------------------------------------------------------------------------------------------  Controlled type 2 diabetes mellitus with complication, without long-term current use of insulin (HCC) 1C is 8.0 today.  Start metformin 1000 mg before breakfast.  Follow-up 3 months. - POCT glycosylated hemoglobin (Hb A1C) - metFORMIN (GLUCOPHAGE) 1000 MG tablet; Take 1 tablet (1,000 mg total) by mouth daily with breakfast.  Dispense: 90 tablet; Refill: 3  2. Essential (primary) hypertension Exercise and follow-up on next visit.  Goal less than 130/8  {Show patient history (optional):23778::" "}   Medications: Outpatient Medications Prior to Visit  Medication Sig  . cetirizine (ZYRTEC) 10 MG tablet Take 10 mg by mouth daily.  . diazepam (VALIUM) 5 MG tablet TAKE 1 TABLET (5 MG TOTAL) BY MOUTH EVERY 12 (TWELVE) HOURS AS NEEDED FOR ANXIETY.  Marland Kitchen EPINEPHRINE, ANAPHYLAXIS THERAPY AGENTS, EPIPEN 2-PAK, 0.3MG /0.3ML (Injection Device)  1 Device as directed for 0 days  Quantity: 1;  Refills: 12   Ordered :05-Mar-2012  Janey Greaser ;  Started 05-Mar-2012 Active  . guaiFENesin-codeine 100-10 MG/5ML syrup Take 5 mLs by mouth every 6 (six) hours as needed for cough.  . metFORMIN (GLUCOPHAGE) 1000 MG tablet Take 1 tablet (1,000 mg total) by mouth daily with breakfast.  .  methocarbamol (ROBAXIN) 750 MG tablet Take 1 tablet (750 mg total) by mouth 4 (four) times daily.  . sertraline (ZOLOFT) 100 MG tablet TAKE 1 TABLET BY MOUTH EVERY DAY   No facility-administered medications prior to visit.    Review of Systems  Constitutional: Negative for appetite change, chills and fever.  Respiratory: Negative for chest tightness, shortness of breath and wheezing.   Cardiovascular: Negative for chest pain and palpitations.  Gastrointestinal: Negative for abdominal pain, nausea  and vomiting.    {Labs  Heme  Chem  Endocrine  Serology  Results Review (optional):23779::" "}   Objective    There were no vitals taken for this visit. {Show previous vital signs (optional):23777::" "}   Physical Exam  ***  No results found for any visits on 09/15/20.  Assessment & Plan     ***  No follow-ups on file.      {provider attestation***:1}   [name], MD  [facility] [phone] (phone) [phone] (fax)  [facility]

## 2020-09-15 ENCOUNTER — Ambulatory Visit: Payer: Self-pay | Admitting: Family Medicine

## 2020-09-22 ENCOUNTER — Other Ambulatory Visit: Payer: Self-pay | Admitting: Family Medicine

## 2020-09-22 DIAGNOSIS — F419 Anxiety disorder, unspecified: Secondary | ICD-10-CM

## 2020-09-22 DIAGNOSIS — F320 Major depressive disorder, single episode, mild: Secondary | ICD-10-CM

## 2020-09-22 DIAGNOSIS — F32A Depression, unspecified: Secondary | ICD-10-CM

## 2020-10-26 NOTE — Progress Notes (Signed)
I,[name],acting as a scribe for [name], MD.,have documented all relevant documentation on the behalf of [name], MD,as directed by  [name], MD while in the presence of [name], MD.   Established patient visit   Patient: [name] [name].   DOB: 07-Apr-1976   45 y.o. Male  MRN: [id number] Visit Date: 10/27/2020  Today's healthcare provider: [name], MD   Chief Complaint  Patient presents with  . Follow-up  . Diabetes  . Hypertension   Subjective    HPI  Patient comes in today for follow-up.  He is a father of 3 sons age 47 with 71 year old twins.  He is working a new job at has a lot of night hours and a walks a lot.  Try to get in 10,000 steps a day.  He has lost 3 pounds.  He is on metformin only for his diabetes and doing well. Diabetes Mellitus Type II, follow-up  Lab Results  Component Value Date   HGBA1C 7.3 (A) 10/27/2020   HGBA1C 8.0 (A) 07/01/2020   HGBA1C 5.7 (H) 01/02/2017   Last seen for diabetes 3 months ago.  Management since then includes; started Metformin 1000 mg qd. He reports good compliance with treatment. He is not having side effects. none  Home blood sugar records: fasting range: not checking  Episodes of hypoglycemia? No none   Current insulin regiment: n/a Most Recent Eye Exam: 4 month ago  --------------------------------------------------------------------  Hypertension, follow-up  BP Readings from Last 3 Encounters:  10/27/20 131/85  07/01/20 (!) 134/97  05/19/20 (!) 150/96   Wt Readings from Last 3 Encounters:  10/27/20 (!) 308 lb (139.7 kg)  07/01/20 (!) 311 lb (141.1 kg)  05/19/20 (!) 315 lb (142.9 kg)     He was last seen for hypertension 3 months ago.  BP at that visit was 150/96. Management since that visit includes; no changes. He reports good compliance with treatment. He is not having side effects. none He is not exercising. He is not adherent to low  salt diet.   Outside blood pressures are not checking.  He does not smoke.  Use of agents associated with hypertension: none.   --------------------------------------------------------------------       Medications: Outpatient Medications Prior to Visit  Medication Sig  . cetirizine (ZYRTEC) 10 MG tablet Take 10 mg by mouth daily.  . diazepam (VALIUM) 5 MG tablet TAKE 1 TABLET (5 MG TOTAL) BY MOUTH EVERY 12 (TWELVE) HOURS AS NEEDED FOR ANXIETY.  Marland Kitchen EPINEPHRINE, ANAPHYLAXIS THERAPY AGENTS, EPIPEN 2-PAK, 0.3MG /0.3ML (Injection Device)  1 Device as directed for 0 days  Quantity: 1;  Refills: 12   Ordered :05-Mar-2012  Janey Greaser ;  Started 05-Mar-2012 Active  . metFORMIN (GLUCOPHAGE) 1000 MG tablet Take 1 tablet (1,000 mg total) by mouth daily with breakfast.  . methocarbamol (ROBAXIN) 750 MG tablet Take 1 tablet (750 mg total) by mouth 4 (four) times daily.  . sertraline (ZOLOFT) 100 MG tablet TAKE 1 TABLET BY MOUTH EVERY DAY  . guaiFENesin-codeine 100-10 MG/5ML syrup Take 5 mLs by mouth every 6 (six) hours as needed for cough. (Patient not taking: Reported on 10/27/2020)   No facility-administered medications prior to visit.    Review of Systems  Constitutional: Negative for activity change and fatigue.  Respiratory: Negative for cough and shortness of breath.   Cardiovascular: Negative for chest pain, palpitations and leg swelling.  Endocrine: Negative for cold intolerance, heat intolerance, polydipsia,  polyphagia and polyuria.  Neurological: Negative for dizziness, light-headedness and headaches.        Objective    BP 131/85 (BP Location: Left Arm, Patient Position: Sitting, Cuff Size: Large)   Pulse 66   Temp (!) 97.5 F (36.4 C) (Oral)   Resp 16   Ht 6' (1.829 m)   Wt (!) 308 lb (139.7 kg)   SpO2 96%   BMI 41.77 kg/m  BP Readings from Last 3 Encounters:  10/27/20 131/85  07/01/20 (!) 134/97  05/19/20 (!) 150/96   Wt Readings from Last 3 Encounters:   10/27/20 (!) 308 lb (139.7 kg)  07/01/20 (!) 311 lb (141.1 kg)  05/19/20 (!) 315 lb (142.9 kg)       Physical Exam Vitals reviewed.  Constitutional:      Appearance: He is well-developed.  HENT:     Head: Normocephalic and atraumatic.     Right Ear: External ear normal.     Left Ear: External ear normal.     Nose: Nose normal.  Eyes:     General: No scleral icterus.    Conjunctiva/sclera: Conjunctivae normal.  Neck:     Thyroid: No thyromegaly.  Cardiovascular:     Rate and Rhythm: Normal rate and regular rhythm.     Heart sounds: Normal heart sounds.  Pulmonary:     Effort: Pulmonary effort is normal.     Breath sounds: Normal breath sounds.  Abdominal:     Palpations: Abdomen is soft.  Skin:    General: Skin is warm and dry.  Neurological:     General: No focal deficit present.     Mental Status: He is alert and oriented to person, place, and time.  Psychiatric:        Mood and Affect: Mood normal.        Behavior: Behavior normal.        Thought Content: Thought content normal.        Judgment: Judgment normal.       Results for orders placed or performed in visit on 10/27/20  POCT glycosylated hemoglobin (Hb A1C)  Result Value Ref Range   Hemoglobin A1C 7.3 (A) 4.0 - 5.6 %   Est. average glucose Bld gHb Est-mCnc 163     Assessment & Plan      1. Controlled type 2 diabetes mellitus with complication, without long-term current use of insulin (HCC) Good control with improving A1c of 7.3.  Goal less than 7.  Patient wishes to work on diet and exercise On next visit need to get microalbumin - POCT glycosylated hemoglobin (Hb A1C)  2. Obstructive sleep apnea syndrome On CPAP  3. Depression, major, single episode, mild (HCC) On sertraline  4. Class 3 severe obesity due to excess calories with serious comorbidity and body mass index (BMI) of 40.0 to 44.9 in adult [facility]) Diet and exercise stressed.   Return in about 4 months (around 02/27/2021).      I,  [name], MD, have reviewed all documentation for this visit. The documentation on 10/27/20 for the exam, diagnosis, procedures, and orders are all accurate and complete.    Keyatta Tolles Wendelyn Breslow, MD  [facility] [phone] (phone) [phone] (fax)  [facility]

## 2020-10-27 ENCOUNTER — Encounter: Payer: Self-pay | Admitting: Family Medicine

## 2020-10-27 ENCOUNTER — Other Ambulatory Visit: Payer: Self-pay

## 2020-10-27 ENCOUNTER — Ambulatory Visit: Payer: BC Managed Care – PPO | Admitting: Family Medicine

## 2020-10-27 VITALS — BP 131/85 | HR 66 | Temp 97.5°F | Resp 16 | Ht 72.0 in | Wt 308.0 lb

## 2020-10-27 DIAGNOSIS — G4733 Obstructive sleep apnea (adult) (pediatric): Secondary | ICD-10-CM

## 2020-10-27 DIAGNOSIS — F320 Major depressive disorder, single episode, mild: Secondary | ICD-10-CM | POA: Diagnosis not present

## 2020-10-27 DIAGNOSIS — E118 Type 2 diabetes mellitus with unspecified complications: Secondary | ICD-10-CM

## 2020-10-27 DIAGNOSIS — Z6841 Body Mass Index (BMI) 40.0 and over, adult: Secondary | ICD-10-CM

## 2020-10-27 LAB — POCT GLYCOSYLATED HEMOGLOBIN (HGB A1C)
Est. average glucose Bld gHb Est-mCnc: 163
Hemoglobin A1C: 7.3 % — AB (ref 4.0–5.6)

## 2020-12-24 ENCOUNTER — Other Ambulatory Visit: Payer: Self-pay | Admitting: Family Medicine

## 2020-12-24 DIAGNOSIS — F32A Depression, unspecified: Secondary | ICD-10-CM

## 2020-12-24 DIAGNOSIS — F419 Anxiety disorder, unspecified: Secondary | ICD-10-CM

## 2020-12-24 DIAGNOSIS — F320 Major depressive disorder, single episode, mild: Secondary | ICD-10-CM

## 2021-02-26 NOTE — Progress Notes (Signed)
I,[name] [name],acting as a scribe for [name] Mans, MD.,have documented all relevant documentation on the behalf of [name] Mans, MD,as directed by  [name] Mans, MD while in the presence of [name] Mans, MD.   Established patient visit   Patient: [name] [name].   DOB: 08-11-1975   45 y.o. Male  MRN: [id number] Visit Date: 03/01/2021  Today's healthcare provider: [name], MD   Chief Complaint  Patient presents with   Follow-up   Diabetes   Depression   Subjective    HPI  Patient feels well and has no complaints.  He is taking his medications as prescribed. He is having difficulty ongoing with weight/diet/exercise. Diabetes Mellitus Type II, Follow-up  Lab Results  Component Value Date   HGBA1C 7.3 (A) 10/27/2020   HGBA1C 8.0 (A) 07/01/2020   HGBA1C 5.7 (H) 01/02/2017   Wt Readings from Last 3 Encounters:  03/01/21 (!) 303 lb (137.4 kg)  10/27/20 (!) 308 lb (139.7 kg)  07/01/20 (!) 311 lb (141.1 kg)   Last seen for diabetes 4 months ago.  Management since then includes no medication changes. He reports good compliance with treatment. He is not having side effects. none Home blood sugar records: fasting range: not checking  Episodes of hypoglycemia? No none   Current insulin regiment: n/a Most Recent Eye Exam: due Current exercise: walking Current diet habits: well balanced  Pertinent Labs: Lab Results  Component Value Date   CHOL 220 (H) 05/19/2020   HDL 49 05/19/2020   LDLCALC 152 (H) 05/19/2020   TRIG 107 05/19/2020   CHOLHDL 4.5 05/19/2020   Lab Results  Component Value Date   NA 137 05/19/2020   K 4.4 05/19/2020   CREATININE 0.89 05/19/2020   GFRNONAA 103 05/19/2020   GFRAA 119 05/19/2020   GLUCOSE 168 (H) 05/19/2020     --------------------------------------------------------------------------------------------------- Depression, Follow-up  He  was last seen for this 4 months ago. Changes made at  last visit include no medication changes. On Sertraline.    He reports good compliance with treatment. He is not having side effects. none  He reports good tolerance of treatment. Current symptoms include: fatigue He feels he is Improved since last visit.  Depression screen [facility] 2/9 10/27/2020 05/19/2020 03/02/2017  Decreased Interest 0 0 0  Down, Depressed, Hopeless 0 0 0  PHQ - 2 Score 0 0 0  Altered sleeping 0 0 0  Tired, decreased energy 0 1 0  Change in appetite 0 0 0  Feeling bad or failure about yourself  0 0 0  Trouble concentrating 0 0 0  Moving slowly or fidgety/restless 0 0 0  Suicidal thoughts 0 0 0  PHQ-9 Score 0 1 0  Difficult doing work/chores Not difficult at all Not difficult at all Not difficult at all    -----------------------------------------------------------------------------------------     Medications: Outpatient Medications Prior to Visit  Medication Sig   cetirizine (ZYRTEC) 10 MG tablet Take 10 mg by mouth daily.   metFORMIN (GLUCOPHAGE) 1000 MG tablet Take 1 tablet (1,000 mg total) by mouth daily with breakfast.   Multiple Vitamin (MULTIVITAMIN PO) Take by mouth.   sertraline (ZOLOFT) 100 MG tablet TAKE 1 TABLET BY MOUTH EVERY DAY   diazepam (VALIUM) 5 MG tablet TAKE 1 TABLET (5 MG TOTAL) BY MOUTH EVERY 12 (TWELVE) HOURS AS NEEDED FOR ANXIETY. (Patient not taking: Reported on 03/01/2021)   EPINEPHRINE, ANAPHYLAXIS THERAPY AGENTS, EPIPEN 2-PAK, 0.3MG /0.3ML (Injection Device)  1  Device as directed for 0 days  Quantity: 1;  Refills: 12   Ordered :05-Mar-2012  Janey Greaser ;  Started 05-Mar-2012 Active (Patient not taking: Reported on 03/01/2021)   methocarbamol (ROBAXIN) 750 MG tablet Take 1 tablet (750 mg total) by mouth 4 (four) times daily. (Patient not taking: Reported on 03/01/2021)   [DISCONTINUED] guaiFENesin-codeine 100-10 MG/5ML syrup Take 5 mLs by mouth every 6 (six) hours as needed for cough. (Patient not taking: Reported on 10/27/2020)    No facility-administered medications prior to visit.    Review of Systems  Constitutional:  Negative for activity change and fatigue.  Respiratory:  Negative for cough and shortness of breath.   Cardiovascular:  Negative for chest pain, palpitations and leg swelling.  Endocrine: Negative for cold intolerance, heat intolerance, polydipsia, polyphagia and polyuria.  Neurological:  Negative for dizziness, light-headedness and headaches.  Psychiatric/Behavioral:  Negative for agitation, decreased concentration, self-injury, sleep disturbance and suicidal ideas. The patient is not nervous/anxious.    Last hemoglobin A1c Lab Results  Component Value Date   HGBA1C 8.8 (H) 03/01/2021       Objective    BP 126/86 (BP Location: Left Arm, Patient Position: Sitting, Cuff Size: Large)   Pulse 70   Resp 16   Ht 6' (1.829 m)   Wt (!) 303 lb (137.4 kg)   SpO2 96%   BMI 41.09 kg/m  BP Readings from Last 3 Encounters:  03/01/21 126/86  10/27/20 131/85  07/01/20 (!) 134/97   Wt Readings from Last 3 Encounters:  03/01/21 (!) 303 lb (137.4 kg)  10/27/20 (!) 308 lb (139.7 kg)  07/01/20 (!) 311 lb (141.1 kg)      Physical Exam Vitals reviewed.  Constitutional:      Appearance: He is well-developed.  HENT:     Head: Normocephalic and atraumatic.     Right Ear: External ear normal.     Left Ear: External ear normal.     Nose: Nose normal.  Eyes:     General: No scleral icterus.    Conjunctiva/sclera: Conjunctivae normal.  Neck:     Thyroid: No thyromegaly.  Cardiovascular:     Rate and Rhythm: Normal rate and regular rhythm.     Heart sounds: Normal heart sounds.  Pulmonary:     Effort: Pulmonary effort is normal.     Breath sounds: Normal breath sounds.  Abdominal:     Palpations: Abdomen is soft.  Skin:    General: Skin is warm and dry.  Neurological:     General: No focal deficit present.     Mental Status: He is alert and oriented to person, place, and time.      Comments: Diabetic foot exam today is normal.  Psychiatric:        Mood and Affect: Mood normal.        Behavior: Behavior normal.        Thought Content: Thought content normal.        Judgment: Judgment normal.      No results found for any visits on 03/01/21.  Assessment & Plan     1. Controlled type 2 diabetes mellitus with complication, without long-term current use of insulin (HCC) Goal A1c less than 7 on metformin maximum dose - Hemoglobin A1c - CBC with Differential/Platelet  2. Essential (primary) hypertension Normotensive to this point.  Need microalbumin and will treat blood pressure appropriately.  Blood pressure today is less than goal of less than 130/80 - Comprehensive metabolic panel -  TSH  3. Obstructive sleep apnea syndrome On CPAP  4. Class 3 severe obesity due to excess calories with serious comorbidity and body mass index (BMI) of 40.0 to 44.9 in adult [facility]) Diet and exercise stressed  5. Depression, major, single episode, mild (HCC) Continue sertraline.  6. Gastroesophageal reflux disease without esophagitis     Return in about 5 months (around 08/01/2021).      I, [name] Mans, MD, have reviewed all documentation for this visit. The documentation on 03/04/21 for the exam, diagnosis, procedures, and orders are all accurate and complete.    Brogan Martis Wendelyn Breslow, MD  [facility] [phone] (phone) [phone] (fax)  [facility]

## 2021-03-01 ENCOUNTER — Encounter: Payer: Self-pay | Admitting: Family Medicine

## 2021-03-01 ENCOUNTER — Ambulatory Visit: Payer: BC Managed Care – PPO | Admitting: Family Medicine

## 2021-03-01 ENCOUNTER — Other Ambulatory Visit: Payer: Self-pay

## 2021-03-01 VITALS — BP 126/86 | HR 70 | Resp 16 | Ht 72.0 in | Wt 303.0 lb

## 2021-03-01 DIAGNOSIS — G4733 Obstructive sleep apnea (adult) (pediatric): Secondary | ICD-10-CM

## 2021-03-01 DIAGNOSIS — E118 Type 2 diabetes mellitus with unspecified complications: Secondary | ICD-10-CM

## 2021-03-01 DIAGNOSIS — K219 Gastro-esophageal reflux disease without esophagitis: Secondary | ICD-10-CM

## 2021-03-01 DIAGNOSIS — E66813 Obesity, class 3: Secondary | ICD-10-CM

## 2021-03-01 DIAGNOSIS — F320 Major depressive disorder, single episode, mild: Secondary | ICD-10-CM

## 2021-03-01 DIAGNOSIS — I10 Essential (primary) hypertension: Secondary | ICD-10-CM | POA: Diagnosis not present

## 2021-03-01 DIAGNOSIS — Z6841 Body Mass Index (BMI) 40.0 and over, adult: Secondary | ICD-10-CM

## 2021-03-02 LAB — COMPREHENSIVE METABOLIC PANEL
ALT: 60 IU/L — ABNORMAL HIGH (ref 0–44)
AST: 37 IU/L (ref 0–40)
Albumin/Globulin Ratio: 1.5 (ref 1.2–2.2)
Albumin: 4.7 g/dL (ref 4.0–5.0)
Alkaline Phosphatase: 83 IU/L (ref 44–121)
BUN/Creatinine Ratio: 19 (ref 9–20)
BUN: 19 mg/dL (ref 6–24)
Bilirubin Total: 0.4 mg/dL (ref 0.0–1.2)
CO2: 23 mmol/L (ref 20–29)
Calcium: 9.6 mg/dL (ref 8.7–10.2)
Chloride: 99 mmol/L (ref 96–106)
Creatinine, Ser: 0.98 mg/dL (ref 0.76–1.27)
Globulin, Total: 3.1 g/dL (ref 1.5–4.5)
Glucose: 167 mg/dL — ABNORMAL HIGH (ref 65–99)
Potassium: 4.7 mmol/L (ref 3.5–5.2)
Sodium: 138 mmol/L (ref 134–144)
Total Protein: 7.8 g/dL (ref 6.0–8.5)
eGFR: 96 mL/min/{1.73_m2} (ref >59)

## 2021-03-02 LAB — CBC WITH DIFFERENTIAL/PLATELET
Basophils Absolute: 0.1 10*3/uL (ref 0.0–0.2)
Basos: 1 %
EOS (ABSOLUTE): 0.2 10*3/uL (ref 0.0–0.4)
Eos: 2 %
Hematocrit: 42.5 % (ref 37.5–51.0)
Hemoglobin: 15.4 g/dL (ref 13.0–17.7)
Immature Grans (Abs): 0 10*3/uL (ref 0.0–0.1)
Immature Granulocytes: 0 %
Lymphocytes Absolute: 3.3 10*3/uL — ABNORMAL HIGH (ref 0.7–3.1)
Lymphs: 36 %
MCH: 32.7 pg (ref 26.6–33.0)
MCHC: 36.2 g/dL — ABNORMAL HIGH (ref 31.5–35.7)
MCV: 90 fL (ref 79–97)
Monocytes Absolute: 0.7 10*3/uL (ref 0.1–0.9)
Monocytes: 8 %
Neutrophils Absolute: 4.9 10*3/uL (ref 1.4–7.0)
Neutrophils: 53 %
Platelets: 276 10*3/uL (ref 150–450)
RBC: 4.71 x10E6/uL (ref 4.14–5.80)
RDW: 13.2 % (ref 11.6–15.4)
WBC: 9.2 10*3/uL (ref 3.4–10.8)

## 2021-03-02 LAB — TSH: TSH: 1.98 u[IU]/mL (ref 0.450–4.500)

## 2021-03-02 LAB — HEMOGLOBIN A1C
Est. average glucose Bld gHb Est-mCnc: 206 mg/dL
Hgb A1c MFr Bld: 8.8 % — ABNORMAL HIGH (ref 4.8–5.6)

## 2021-03-08 ENCOUNTER — Other Ambulatory Visit: Payer: Self-pay

## 2021-03-08 DIAGNOSIS — E118 Type 2 diabetes mellitus with unspecified complications: Secondary | ICD-10-CM

## 2021-03-08 MED ORDER — METFORMIN HCL 1000 MG PO TABS: 1000.0000 mg | ORAL_TABLET | Freq: Two times a day (BID) | ORAL | 3 refills | Status: DC

## 2021-04-01 ENCOUNTER — Other Ambulatory Visit: Payer: Self-pay | Admitting: Family Medicine

## 2021-04-01 ENCOUNTER — Telehealth: Payer: Self-pay | Admitting: Family Medicine

## 2021-04-01 DIAGNOSIS — F419 Anxiety disorder, unspecified: Secondary | ICD-10-CM

## 2021-04-01 DIAGNOSIS — F320 Major depressive disorder, single episode, mild: Secondary | ICD-10-CM

## 2021-04-01 DIAGNOSIS — Z1211 Encounter for screening for malignant neoplasm of colon: Secondary | ICD-10-CM

## 2021-04-01 NOTE — Telephone Encounter (Signed)
It is okay to refer pt?  Thanks,   -Vernona Rieger

## 2021-04-01 NOTE — Telephone Encounter (Signed)
Requested Prescriptions  Pending Prescriptions Disp Refills  . sertraline (ZOLOFT) 100 MG tablet [Pharmacy Med Name: SERTRALINE HCL 100 MG TABLET] 90 tablet 1    Sig: TAKE 1 TABLET BY MOUTH EVERY DAY     Psychiatry:  Antidepressants - SSRI Passed - 04/01/2021  3:50 AM      Passed - Completed PHQ-2 or PHQ-9 in the last 360 days      Passed - Valid encounter within last 6 months    Recent Outpatient Visits          1 month ago Controlled type 2 diabetes mellitus with complication, without long-term current use of insulin [facility])   [facility] Maple Hudson., MD   5 months ago Controlled type 2 diabetes mellitus with complication, without long-term current use of insulin [facility])   [facility] Maple Hudson., MD   9 months ago Controlled type 2 diabetes mellitus with complication, without long-term current use of insulin [facility])   [facility] Maple Hudson., MD   10 months ago Annual physical exam   [facility] Maple Hudson., MD   1 year ago Anxiety and depression   [facility] Maple Hudson., MD      Future Appointments            In 2 months Maple Hudson., MD [name]   In 3 months Maple Hudson., MD [facility], [name]

## 2021-04-01 NOTE — Telephone Encounter (Signed)
Referral Request - Has patient seen PCP for this complaint? Yes.   Referral for which specialty: GI Preferred provider/office: any Reason for referral:  Pt states he spoke with Dr Sullivan Lone at last visit about getting a colonoscopy. Pt states he has some family history of issues in this are and he had not heard anything from anyone.  Could Dr Sullivan Lone put in referral for pt?

## 2021-05-03 ENCOUNTER — Other Ambulatory Visit: Payer: Self-pay

## 2021-05-03 ENCOUNTER — Telehealth: Payer: Self-pay | Admitting: Gastroenterology

## 2021-05-03 MED ORDER — NA SULFATE-K SULFATE-MG SULF 17.5-3.13-1.6 GM/177ML PO SOLN: 354.0000 mL | Freq: Once | ORAL | 0 refills | Status: AC

## 2021-05-03 NOTE — Telephone Encounter (Signed)
Patient stated that he hasn't received his prep medicine and hasn't received any letters. Clinical staff will follow up with patient.

## 2021-05-04 ENCOUNTER — Other Ambulatory Visit: Payer: Self-pay

## 2021-05-04 MED ORDER — SUTAB 1479-225-188 MG PO TABS: ORAL_TABLET | ORAL | 0 refills | Status: DC

## 2021-05-04 NOTE — Telephone Encounter (Signed)
Spoke to patient and informed him that Dr. Tobi Bastos was out of the [place], therefore, I needed to either switch him to another provider on the same day or rescheule him to another date. Patient decided to stay on the same day but with a difference provider. Prescription was also sen to his pharmacy.

## 2021-05-05 ENCOUNTER — Encounter: Payer: Self-pay | Admitting: Gastroenterology

## 2021-05-06 ENCOUNTER — Ambulatory Visit: Payer: BC Managed Care – PPO | Admitting: Certified Registered"

## 2021-05-06 ENCOUNTER — Encounter
Admission: RE | Disposition: A | Discharge status: Home / Self Care | Payer: Self-pay | Source: Home / Self Care | Attending: Gastroenterology

## 2021-05-06 ENCOUNTER — Ambulatory Visit
Admission: RE | Admit: 2021-05-06 | Discharge: 2021-05-06 | Disposition: A | Discharge status: Home / Self Care | Payer: BC Managed Care – PPO | Attending: Gastroenterology | Admitting: Gastroenterology

## 2021-05-06 DIAGNOSIS — Z1211 Encounter for screening for malignant neoplasm of colon: Secondary | ICD-10-CM

## 2021-05-06 DIAGNOSIS — K219 Gastro-esophageal reflux disease without esophagitis: Secondary | ICD-10-CM | POA: Insufficient documentation

## 2021-05-06 DIAGNOSIS — K648 Other hemorrhoids: Secondary | ICD-10-CM | POA: Diagnosis not present

## 2021-05-06 DIAGNOSIS — I10 Essential (primary) hypertension: Secondary | ICD-10-CM | POA: Insufficient documentation

## 2021-05-06 DIAGNOSIS — Z6839 Body mass index (BMI) 39.0-39.9, adult: Secondary | ICD-10-CM | POA: Insufficient documentation

## 2021-05-06 DIAGNOSIS — G4730 Sleep apnea, unspecified: Secondary | ICD-10-CM | POA: Insufficient documentation

## 2021-05-06 DIAGNOSIS — K635 Polyp of colon: Secondary | ICD-10-CM

## 2021-05-06 HISTORY — PX: COLONOSCOPY: SHX5424

## 2021-05-06 LAB — GLUCOSE, CAPILLARY: Glucose-Capillary: 138 mg/dL — ABNORMAL HIGH (ref 70–99)

## 2021-05-06 SURGERY — COLONOSCOPY
Anesthesia: General

## 2021-05-06 MED ORDER — SODIUM CHLORIDE 0.9 % IV SOLN: INTRAVENOUS | Status: DC

## 2021-05-06 MED ORDER — PROPOFOL 10 MG/ML IV BOLUS
INTRAVENOUS | Status: DC | PRN
  Administered 2021-05-06: 70 mg via INTRAVENOUS
  Administered 2021-05-06: 30 mg via INTRAVENOUS

## 2021-05-06 MED ORDER — PROPOFOL 500 MG/50ML IV EMUL
INTRAVENOUS | Status: DC | PRN
  Administered 2021-05-06: 150 ug/kg/min via INTRAVENOUS

## 2021-05-06 MED ORDER — PROPOFOL 500 MG/50ML IV EMUL
INTRAVENOUS | Status: AC
  Filled 2021-05-06: qty 50

## 2021-05-06 NOTE — Op Note (Signed)
[facility] Patient Name: [name] [name] Procedure Date: 05/06/2021 11:53 AM MRN: [id number] Account #: [id number] Date of Birth: 1976/04/11 Admit Type: Outpatient Age: 45 Room: [facility] ENDO ROOM 2 Gender: Male Note Status: Finalized Instrument Name: Colonoscope [id number] Procedure:             Colonoscopy Indications:           Screening for colorectal malignant neoplasm Providers:             Quetzali Heinle B. Maximino Greenland MD, MD Referring MD:          Ferdinand Lango. Sullivan Lone, MD (Referring MD) Medicines:             Monitored Anesthesia Care Complications:         No immediate complications. Procedure:             Pre-Anesthesia Assessment:                        - ASA Grade Assessment: II - A patient with mild                         systemic disease.                        - Prior to the procedure, a History and Physical was                         performed, and patient medications, allergies and                         sensitivities were reviewed. The patient's tolerance                         of previous anesthesia was reviewed.                        - The risks and benefits of the procedure and the                         sedation options and risks were discussed with the                         patient. All questions were answered and informed                         consent was obtained.                        - Patient identification and proposed procedure were                         verified prior to the procedure by the physician, the                         nurse, the anesthesiologist, the anesthetist and the                         technician. The procedure was verified in the  procedure room.                        After obtaining informed consent, the colonoscope was                         passed under direct vision. Throughout the procedure,                         the patient's blood pressure, pulse, and oxygen                          saturations were monitored continuously. The                         Colonoscope was introduced through the anus and                         advanced to the the cecum, identified by appendiceal                         orifice and ileocecal valve. The colonoscopy was                         performed with ease. The patient tolerated the                         procedure well. The quality of the bowel preparation                         was fair and good except the ascending colon was fair                         and the cecum was fair. Findings:      The perianal and digital rectal examinations were normal.      A 4 mm polyp was found in the cecum. The polyp was sessile. The polyp       was removed with a jumbo cold forceps. Resection and retrieval were       complete.      The exam was otherwise without abnormality.      The rectum, sigmoid colon, descending colon, transverse colon, ascending       colon and cecum appeared normal.      Non-bleeding internal hemorrhoids were found during retroflexion. Impression:            - Preparation of the colon was fair.                        - One 4 mm polyp in the cecum, removed with a jumbo                         cold forceps. Resected and retrieved.                        - The examination was otherwise normal.                        - The rectum, sigmoid colon, descending colon,  transverse colon, ascending colon and cecum are normal.                        - Non-bleeding internal hemorrhoids. Recommendation:        - Discharge patient to home (with escort).                        - Advance diet as tolerated.                        - Continue present medications.                        - Await pathology results.                        - Repeat colonoscopy date to be determined after                         pending pathology results are reviewed.                        - The findings and recommendations  were discussed with                         the patient.                        - The findings and recommendations were discussed with                         the patient's family.                        - Return to primary care physician as previously                         scheduled.                        - High fiber diet. Procedure Code(s):     --- Professional ---                        [phone], Colonoscopy, flexible; with biopsy, single or                         multiple Diagnosis Code(s):     --- Professional ---                        Z12.11, Encounter for screening for malignant neoplasm                         of colon                        K63.5, Polyp of colon CPT copyright 2019 American Medical Association. All rights reserved. The codes documented in this report are preliminary and upon coder review may  be revised to meet current compliance requirements.  Melodie Bouillon, MD Michel Bickers B. Maximino Greenland MD, MD 05/06/2021 12:25:24 PM This report has been signed electronically. Number of Addenda: 0 Note  Initiated On: 05/06/2021 11:53 AM Scope Withdrawal Time: 0 hours 16 minutes 33 seconds  Total Procedure Duration: 0 hours 20 minutes 15 seconds  Estimated Blood Loss:  Estimated blood loss: none.      [facility]

## 2021-05-06 NOTE — H&P (Signed)
Vonda Antigua, MD [address], [place], [place], [place], [address] [address], [place], [place], [place], [address] Phone: [phone]  Fax: [phone]  Primary Care Physician:  Jerrol Banana., MD   Pre-Procedure History & Physical: HPI:  [name] Gripp. is a 45 y.o. male is here for a colonoscopy.   Past Medical History:  Diagnosis Date   Depressed    Depression    GERD (gastroesophageal reflux disease)    Headache(784.0)    Hypertension    Sleep apnea    cpap    Past Surgical History:  Procedure Laterality Date   BACK SURGERY     LUMBAR DISC SURGERY     LUMBAR DISC SURGERY     bulging disc repaired   LUMBAR SPINE SURGERY  11/08/2011   LUMBAR WOUND DEBRIDEMENT  11/23/2011   Procedure: LUMBAR WOUND DEBRIDEMENT;  Surgeon: Floyce Stakes, MD;  Location: [facility];  Service: Neurosurgery;  Laterality: N/A;  Incision and drainage of lumbar wound   spine     spine spurs cleaned up and arthritis    Prior to Admission medications   Medication Sig Start Date End Date Taking? Authorizing Provider  cetirizine (ZYRTEC) 10 MG tablet Take 10 mg by mouth daily.   Yes [provider]  metFORMIN (GLUCOPHAGE) 1000 MG tablet Take 1 tablet (1,000 mg total) by mouth 2 (two) times daily with a meal. Take daily with breakfast and at dinner 03/08/21  Yes Jerrol Banana., MD  Multiple Vitamin (MULTIVITAMIN PO) Take by mouth.   Yes [provider]  sertraline (ZOLOFT) 100 MG tablet TAKE 1 TABLET BY MOUTH EVERY DAY 04/01/21  Yes Jerrol Banana., MD  Sodium Sulfate-Mag Sulfate-KCl (SUTAB) [phone] MG TABS At 5 PM take 12 tablets using the 8 oz cup provided in the kit drinking 5 cups of water and 5 hours before your procedure repeat the same process. 05/04/21  Yes Zyion Leidner, Margretta Sidle B, MD  diazepam (VALIUM) 5 MG tablet TAKE 1 TABLET (5 MG TOTAL) BY MOUTH EVERY 12 (TWELVE) HOURS AS NEEDED FOR ANXIETY. Patient not taking: Reported on  03/01/2021 08/29/18   Jerrol Banana., MD  EPINEPHRINE, ANAPHYLAXIS THERAPY AGENTS, EPIPEN 2-PAK, 0.$RemoveBeforeD'3MG'NpEjlwWwRWxpgI$ /0.3ML (Injection Device)  1 Device as directed for 0 days  Quantity: 1;  Refills: 12   Ordered :05-Mar-2012  Althea Charon ;  Started 05-Mar-2012 Active Patient not taking: Reported on 03/01/2021 03/05/12   [provider]  methocarbamol (ROBAXIN) 750 MG tablet Take 1 tablet (750 mg total) by mouth 4 (four) times daily. Patient not taking: Reported on 03/01/2021 08/13/18   Chrismon, Vickki Muff, PA-C    Allergies as of 04/29/2021 - Review Complete 03/01/2021  Allergen Reaction Noted   Penicillin g Anaphylaxis 07/25/2017   Penicillins Anaphylaxis 08/10/2011   Shellfish allergy Anaphylaxis 12/14/2011   Yellow jacket venom Shortness Of Breath 12/27/2014    Family History  Problem Relation Age of Onset   Diabetes Mother    Hypertension Mother    Hyperlipidemia Mother    CAD Other    Irregular heart beat Maternal Grandmother    Heart attack Maternal Grandfather     Social History   Socioeconomic History   Marital status: Married    Spouse name: Not on file   Number of children: Not on file   Years of education: Not on file   Highest education level: Not on file  Occupational History   Not on file  Tobacco Use  Smoking status: Former    Packs/day: 0.30    Years: 3.00    Pack years: 0.90    Types: Cigarettes    Quit date: 08/09/1992    Years since quitting: 28.7   Smokeless tobacco: Current    Types: Chew  Vaping Use   Vaping Use: Never used  Substance and Sexual Activity   Alcohol use: Yes    Alcohol/week: 3.0 - 4.0 standard drinks    Types: 3 - 4 Cans of beer per week    Comment: occasional   Drug use: No   Sexual activity: Yes  Other Topics Concern   Not on file  Social History Narrative   Not on file   Social Determinants of Health   Financial Resource Strain: Not on file  Food Insecurity: Not on file  Transportation Needs: Not on file   Physical Activity: Not on file  Stress: Not on file  Social Connections: Not on file  Intimate Partner Violence: Not on file    Review of Systems: See HPI, otherwise negative ROS  Physical Exam: Constitutional: General:   Alert,  Well-developed, well-nourished, pleasant and cooperative in NAD BP (!) 141/94   Pulse 63   Temp (!) 96.5 F (35.8 C) (Temporal)   Resp 20   Ht 6' (1.829 m)   Wt 131.5 kg   SpO2 99%   BMI 39.33 kg/m   Head: Normocephalic, atraumatic.   Eyes:  Sclera clear, no icterus.   Conjunctiva pink.   Mouth:  No deformity or lesions, oropharynx pink & moist.  Neck:  Supple, trachea midline  Respiratory: Normal respiratory effort  Gastrointestinal:  Soft, non-tender and non-distended without masses, hepatosplenomegaly or hernias noted.  No guarding or rebound tenderness.     Cardiac: No clubbing or edema.  No cyanosis. Normal posterior tibial pedal pulses noted.  Lymphatic:  No significant cervical adenopathy.  Psych:  Alert and cooperative. Normal mood and affect.  Musculoskeletal:   Symmetrical without gross deformities. 5/5 Lower extremity strength bilaterally.  Skin: Warm. Intact without significant lesions or rashes. No jaundice.  Neurologic:  Face symmetrical, tongue midline, Normal sensation to touch;  grossly normal neurologically.  Psych:  Alert and oriented x3, Alert and cooperative. Normal mood and affect.  Impression/Plan: [name] Leash. is here for a colonoscopy to be performed for average risk screening.  Risks, benefits, limitations, and alternatives regarding  colonoscopy have been reviewed with the patient.  Questions have been answered.  All parties agreeable.   Virgel Manifold, MD  05/06/2021, 10:32 AM

## 2021-05-06 NOTE — Anesthesia Postprocedure Evaluation (Signed)
Anesthesia Post Note  Patient: [name] [name].  Procedure(s) Performed: COLONOSCOPY  Patient location during evaluation: Phase II Anesthesia Type: General Level of consciousness: awake and alert, awake and oriented Pain management: pain level controlled Vital Signs Assessment: post-procedure vital signs reviewed and stable Respiratory status: spontaneous breathing, nonlabored ventilation and respiratory function stable Cardiovascular status: blood pressure returned to baseline and stable Postop Assessment: no apparent nausea or vomiting Anesthetic complications: no   No notable events documented.   Last Vitals:  Vitals:   05/06/21 1247 05/06/21 1257  BP: 131/78   Pulse:    Resp: 16 17  Temp:    SpO2:      Last Pain:  Vitals:   05/06/21 1257  TempSrc:   PainSc: 0-No pain                 Manfred Arch

## 2021-05-06 NOTE — Transfer of Care (Signed)
Immediate Anesthesia Transfer of Care Note  Patient: [name] [name].  Procedure(s) Performed: COLONOSCOPY  Patient Location: Endoscopy Unit  Anesthesia Type:General  Level of Consciousness: awake, alert  and oriented  Airway & Oxygen Therapy: Patient Spontanous Breathing and Patient connected to face mask oxygen  Post-op Assessment: Report given to RN and Post -op Vital signs reviewed and stable  Post vital signs: Reviewed and stable  Last Vitals:  Vitals Value Taken Time  BP    Temp    Pulse    Resp    SpO2      Last Pain:  Vitals:   05/06/21 1023  TempSrc: Temporal  PainSc: 0-No pain         Complications: No notable events documented.

## 2021-05-06 NOTE — Anesthesia Preprocedure Evaluation (Signed)
Anesthesia Evaluation  Patient identified by MRN, date of birth, ID band Patient awake    Reviewed: Allergy & Precautions, H&P , NPO status , Patient's Chart, lab work & pertinent test results  Airway Mallampati: III  TM Distance: >3 FB Neck ROM: Full    Dental no notable dental hx.    Pulmonary sleep apnea and Continuous Positive Airway Pressure Ventilation , former smoker,    Pulmonary exam normal        Cardiovascular hypertension, negative cardio ROS Normal cardiovascular exam     Neuro/Psych  Headaches, PSYCHIATRIC DISORDERS Anxiety Depression    GI/Hepatic Neg liver ROS, Bowel prep,GERD  Controlled,  Endo/Other  Morbid obesity  Renal/GU negative Renal ROS  negative genitourinary   Musculoskeletal negative musculoskeletal ROS (+)   Abdominal   Peds negative pediatric ROS (+)  Hematology negative hematology ROS (+)   Anesthesia Other Findings Depressed    Depression    GERD (gastroesophageal reflux disease) Headache(784.0)    Hypertension    Sleep apnea  cpap     Reproductive/Obstetrics negative OB ROS                             Anesthesia Physical Anesthesia Plan  ASA: 3  Anesthesia Plan: General   Post-op Pain Management:    Induction: Intravenous  PONV Risk Score and Plan: 2  Airway Management Planned: Natural Airway and Nasal Cannula  Additional Equipment:   Intra-op Plan:   Post-operative Plan:   Informed Consent: I have reviewed the patients History and Physical, chart, labs and discussed the procedure including the risks, benefits and alternatives for the proposed anesthesia with the patient or authorized representative who has indicated his/her understanding and acceptance.       Plan Discussed with: CRNA, Anesthesiologist and Surgeon  Anesthesia Plan Comments:         Anesthesia Quick Evaluation

## 2021-05-07 ENCOUNTER — Encounter: Payer: Self-pay | Admitting: Gastroenterology

## 2021-05-07 LAB — SURGICAL PATHOLOGY

## 2021-05-16 ENCOUNTER — Other Ambulatory Visit: Payer: Self-pay

## 2021-06-02 ENCOUNTER — Encounter: Payer: Self-pay | Admitting: Family Medicine

## 2021-07-07 ENCOUNTER — Ambulatory Visit: Payer: BC Managed Care – PPO | Admitting: Family Medicine

## 2021-07-15 ENCOUNTER — Other Ambulatory Visit: Payer: Self-pay | Admitting: Podiatry

## 2021-07-15 DIAGNOSIS — M79672 Pain in left foot: Secondary | ICD-10-CM

## 2021-07-15 DIAGNOSIS — L02612 Cutaneous abscess of left foot: Secondary | ICD-10-CM

## 2021-07-15 DIAGNOSIS — E119 Type 2 diabetes mellitus without complications: Secondary | ICD-10-CM | POA: Diagnosis not present

## 2021-07-20 ENCOUNTER — Other Ambulatory Visit: Payer: Self-pay | Admitting: Podiatry

## 2021-07-20 DIAGNOSIS — L02612 Cutaneous abscess of left foot: Secondary | ICD-10-CM

## 2021-07-20 DIAGNOSIS — M79672 Pain in left foot: Secondary | ICD-10-CM

## 2021-07-29 DIAGNOSIS — E119 Type 2 diabetes mellitus without complications: Secondary | ICD-10-CM | POA: Diagnosis not present

## 2021-07-29 DIAGNOSIS — L02612 Cutaneous abscess of left foot: Secondary | ICD-10-CM | POA: Diagnosis not present

## 2021-07-30 ENCOUNTER — Other Ambulatory Visit: Payer: BC Managed Care – PPO

## 2021-08-02 ENCOUNTER — Ambulatory Visit: Payer: BC Managed Care – PPO

## 2021-08-18 DIAGNOSIS — E109 Type 1 diabetes mellitus without complications: Secondary | ICD-10-CM | POA: Diagnosis not present

## 2021-09-01 ENCOUNTER — Ambulatory Visit: Payer: Self-pay | Admitting: Nurse Practitioner

## 2021-09-01 ENCOUNTER — Encounter: Payer: Self-pay | Admitting: Nurse Practitioner

## 2021-09-01 ENCOUNTER — Other Ambulatory Visit: Payer: Self-pay

## 2021-09-01 VITALS — BP 134/86 | HR 82 | Temp 98.1°F | Resp 18

## 2021-09-02 NOTE — Progress Notes (Signed)
46 year old male presenting to [facility] with request for labs and "checkup" his PCP is Dr. Sullivan Lone. Patient is behind on annual physical and has not had labs since September. He is concerned about his a1c.  ? ?He does have an annual eye exam at [facility] ? ?Has been seeing Dr. Ether Griffins at [facility] for podiatry  ? ?Had a colonoscopy this year  ? ?Denies any other complaints at this time.  ? ?Works with [employer]  ? ?He is interested in getting back into an exercise routine- joined [employer].  ? ? ?Return to clinic for labs - fasting, orders placed ?Continue with Dr. Ether Griffins for podiatry concerns  ?Given information on Exercise is medicine program at [facility]  ?Recommended setting up [employer] app to monitor glucose and BP at home ? ?Will have patient follow up after labwork and communicate any acute needs to PCP  ? ?Today's Vitals  ? 09/01/21 1507  ?BP: 134/86  ?Pulse: 82  ?Resp: 18  ?Temp: 98.1 ?F (36.7 ?C)  ?TempSrc: Tympanic  ?SpO2: 96%  ? ?There is no height or weight on file to calculate BMI.  ?

## 2021-09-06 ENCOUNTER — Other Ambulatory Visit: Payer: Self-pay

## 2021-09-06 DIAGNOSIS — E119 Type 2 diabetes mellitus without complications: Secondary | ICD-10-CM

## 2021-09-07 LAB — CMP12+LP+TP+TSH+6AC+PSA+CBC…
ALT: 32 IU/L (ref 0–44)
AST: 21 IU/L (ref 0–40)
Albumin/Globulin Ratio: 1.4 (ref 1.2–2.2)
Albumin: 4.4 g/dL (ref 4.0–5.0)
Alkaline Phosphatase: 87 IU/L (ref 44–121)
BUN/Creatinine Ratio: 18 (ref 9–20)
BUN: 17 mg/dL (ref 6–24)
Basophils Absolute: 0.1 10*3/uL (ref 0.0–0.2)
Basos: 1 %
Bilirubin Total: 0.3 mg/dL (ref 0.0–1.2)
Calcium: 9.2 mg/dL (ref 8.7–10.2)
Chloride: 99 mmol/L (ref 96–106)
Chol/HDL Ratio: 5 ratio (ref 0.0–5.0)
Cholesterol, Total: 210 mg/dL — ABNORMAL HIGH (ref 100–199)
Creatinine, Ser: 0.95 mg/dL (ref 0.76–1.27)
EOS (ABSOLUTE): 0.1 10*3/uL (ref 0.0–0.4)
Eos: 2 %
Estimated CHD Risk: 1 times avg. (ref 0.0–1.0)
Free Thyroxine Index: 1.3 (ref 1.2–4.9)
GGT: 56 IU/L (ref 0–65)
Globulin, Total: 3.2 g/dL (ref 1.5–4.5)
Glucose: 213 mg/dL — ABNORMAL HIGH (ref 70–99)
HDL: 42 mg/dL (ref >39)
Hematocrit: 43.1 % (ref 37.5–51.0)
Hemoglobin: 15.6 g/dL (ref 13.0–17.7)
Immature Grans (Abs): 0 10*3/uL (ref 0.0–0.1)
Immature Granulocytes: 0 %
Iron: 150 ug/dL (ref 38–169)
LDH: 224 IU/L (ref 121–224)
LDL Chol Calc (NIH): 130 mg/dL — ABNORMAL HIGH (ref 0–99)
Lymphocytes Absolute: 2.5 10*3/uL (ref 0.7–3.1)
Lymphs: 36 %
MCH: 33.3 pg — ABNORMAL HIGH (ref 26.6–33.0)
MCHC: 36.2 g/dL — ABNORMAL HIGH (ref 31.5–35.7)
MCV: 92 fL (ref 79–97)
Monocytes Absolute: 0.7 10*3/uL (ref 0.1–0.9)
Monocytes: 9 %
Neutrophils Absolute: 3.6 10*3/uL (ref 1.4–7.0)
Neutrophils: 52 %
Phosphorus: 3.4 mg/dL (ref 2.8–4.1)
Platelets: 292 10*3/uL (ref 150–450)
Potassium: 4.9 mmol/L (ref 3.5–5.2)
Prostate Specific Ag, Serum: 0.9 ng/mL (ref 0.0–4.0)
RBC: 4.69 x10E6/uL (ref 4.14–5.80)
RDW: 13.6 % (ref 11.6–15.4)
Sodium: 136 mmol/L (ref 134–144)
T3 Uptake Ratio: 21 % — ABNORMAL LOW (ref 24–39)
T4, Total: 6.4 ug/dL (ref 4.5–12.0)
TSH: 1.56 u[IU]/mL (ref 0.450–4.500)
Total Protein: 7.6 g/dL (ref 6.0–8.5)
Triglycerides: 212 mg/dL — ABNORMAL HIGH (ref 0–149)
Uric Acid: 3 mg/dL — ABNORMAL LOW (ref 3.8–8.4)
VLDL Cholesterol Cal: 38 mg/dL (ref 5–40)
WBC: 6.9 10*3/uL (ref 3.4–10.8)
eGFR: 99 mL/min/{1.73_m2} (ref >59)

## 2021-09-07 LAB — VITAMIN D 25 HYDROXY (VIT D DEFICIENCY, FRACTURES): Vit D, 25-Hydroxy: 22.2 ng/mL — ABNORMAL LOW (ref 30.0–100.0)

## 2021-09-07 LAB — HGB A1C W/O EAG: Hgb A1c MFr Bld: 9.5 % — ABNORMAL HIGH (ref 4.8–5.6)

## 2021-09-08 ENCOUNTER — Other Ambulatory Visit: Payer: Self-pay

## 2021-09-08 ENCOUNTER — Other Ambulatory Visit: Payer: Self-pay | Admitting: Nurse Practitioner

## 2021-09-08 ENCOUNTER — Ambulatory Visit: Payer: Self-pay | Admitting: Nurse Practitioner

## 2021-09-08 DIAGNOSIS — E119 Type 2 diabetes mellitus without complications: Secondary | ICD-10-CM

## 2021-09-08 NOTE — Progress Notes (Signed)
Discussed lab results with patient ? ?Advised setting alarm on phone to alert when it is time to take evening metformin  ?Start OTC Vitamin D3 2,000units daily ? ?Low fat diet  ? ?RTC end of June for repeat labs, patient requested to add testosterone at that time as well ? ?He will see PCP in July  ? ?Recent Results (from the past 2160 hour(s))  ?VITAMIN D 25 Hydroxy (Vit-D Deficiency, Fractures)     Status: Abnormal  ? Collection Time: 09/06/21  8:09 AM  ?Result Value Ref Range  ? Vit D, 25-Hydroxy 22.2 (L) 30.0 - 100.0 ng/mL  ?  Comment: Vitamin D deficiency has been defined by the Institute of ?Medicine and an Endocrine Society practice guideline as a ?level of serum 25-OH vitamin D less than 20 ng/mL (1,2). ?The Endocrine Society went on to further define vitamin D ?insufficiency as a level between 21 and 29 ng/mL (2). ?1. IOM (Institute of Medicine). 2010. Dietary reference ?   intakes for calcium and D. [place]: The ?   [employer]. ?2. Holick MF, Binkley Carlisle, Bischoff-Ferrari HA, et al. ?   Evaluation, treatment, and prevention of vitamin D ?   deficiency: an Endocrine Society clinical practice ?   guideline. JCEM. 2011 Jul; 96(7):1911-30. ?  ?CMP12+LP+TP+TSH+6AC+PSA+CBC?     Status: Abnormal  ? Collection Time: 09/06/21  8:09 AM  ?Result Value Ref Range  ? Glucose 213 (H) 70 - 99 mg/dL  ? Uric Acid 3.0 (L) 3.8 - 8.4 mg/dL  ?  Comment:            Therapeutic target for gout patients: <6.0  ? BUN 17 6 - 24 mg/dL  ? Creatinine, Ser 0.95 0.76 - 1.27 mg/dL  ? eGFR 99 >59 mL/min/1.73  ? BUN/Creatinine Ratio 18 9 - 20  ? Sodium 136 134 - 144 mmol/L  ? Potassium 4.9 3.5 - 5.2 mmol/L  ? Chloride 99 96 - 106 mmol/L  ? Calcium 9.2 8.7 - 10.2 mg/dL  ? Phosphorus 3.4 2.8 - 4.1 mg/dL  ? Total Protein 7.6 6.0 - 8.5 g/dL  ? Albumin 4.4 4.0 - 5.0 g/dL  ? Globulin, Total 3.2 1.5 - 4.5 g/dL  ? Albumin/Globulin Ratio 1.4 1.2 - 2.2  ? Bilirubin Total 0.3 0.0 - 1.2 mg/dL  ? Alkaline Phosphatase 87 44 - 121 IU/L   ? LDH 224 121 - 224 IU/L  ? AST 21 0 - 40 IU/L  ? ALT 32 0 - 44 IU/L  ? GGT 56 0 - 65 IU/L  ? Iron 150 38 - 169 ug/dL  ? Cholesterol, Total 210 (H) 100 - 199 mg/dL  ? Triglycerides 212 (H) 0 - 149 mg/dL  ? HDL 42 >39 mg/dL  ? VLDL Cholesterol Cal 38 5 - 40 mg/dL  ? LDL Chol Calc (NIH) 130 (H) 0 - 99 mg/dL  ? Chol/HDL Ratio 5.0 0.0 - 5.0 ratio  ?  Comment:                                   T. Chol/HDL Ratio ?                                            Men  Women ?  1/2 Avg.Risk  3.4    3.3 ?                                  Avg.Risk  5.0    4.4 ?                               2X Avg.Risk  9.6    7.1 ?                               3X Avg.Risk 23.4   11.0 ?  ? Estimated CHD Risk 1.0 0.0 - 1.0 times avg.  ?  Comment: The CHD Risk is based on the T. Chol/HDL ratio. Other ?factors affect CHD Risk such as hypertension, smoking, ?diabetes, severe obesity, and family history of ?premature CHD. ?  ? TSH 1.560 0.450 - 4.500 uIU/mL  ? T4, Total 6.4 4.5 - 12.0 ug/dL  ? T3 Uptake Ratio 21 (L) 24 - 39 %  ? Free Thyroxine Index 1.3 1.2 - 4.9  ? Prostate Specific Ag, Serum 0.9 0.0 - 4.0 ng/mL  ?  Comment: Roche ECLIA methodology. ?According to the American Urological Association, Serum PSA should ?decrease and remain at undetectable levels after radical ?prostatectomy. The AUA defines biochemical recurrence as an initial ?PSA value 0.2 ng/mL or greater followed by a subsequent confirmatory ?PSA value 0.2 ng/mL or greater. ?Values obtained with different assay methods or kits cannot be used ?interchangeably. Results cannot be interpreted as absolute evidence ?of the presence or absence of malignant disease. ?  ? WBC 6.9 3.4 - 10.8 x10E3/uL  ? RBC 4.69 4.14 - 5.80 x10E6/uL  ? Hemoglobin 15.6 13.0 - 17.7 g/dL  ? Hematocrit 43.1 37.5 - 51.0 %  ? MCV 92 79 - 97 fL  ? MCH 33.3 (H) 26.6 - 33.0 pg  ? MCHC 36.2 (H) 31.5 - 35.7 g/dL  ? RDW 13.6 11.6 - 15.4 %  ? Platelets 292 150 - 450 x10E3/uL  ? Neutrophils 52  Not Estab. %  ? Lymphs 36 Not Estab. %  ? Monocytes 9 Not Estab. %  ? Eos 2 Not Estab. %  ? Basos 1 Not Estab. %  ? Neutrophils Absolute 3.6 1.4 - 7.0 x10E3/uL  ? Lymphocytes Absolute 2.5 0.7 - 3.1 x10E3/uL  ? Monocytes Absolute 0.7 0.1 - 0.9 x10E3/uL  ? EOS (ABSOLUTE) 0.1 0.0 - 0.4 x10E3/uL  ? Basophils Absolute 0.1 0.0 - 0.2 x10E3/uL  ? Immature Granulocytes 0 Not Estab. %  ? Immature Grans (Abs) 0.0 0.0 - 0.1 x10E3/uL  ?Hgb A1c w/o eAG     Status: Abnormal  ? Collection Time: 09/06/21  8:09 AM  ?Result Value Ref Range  ? Hgb A1c MFr Bld 9.5 (H) 4.8 - 5.6 %  ?  Comment:          Prediabetes: 5.7 - 6.4 ?         Diabetes: >6.4 ?         Glycemic control for adults with diabetes: <7.0 ?  ?  ?

## 2021-09-09 NOTE — Progress Notes (Signed)
Spoke with patient regarding labs see telephone note  ?

## 2021-09-16 DIAGNOSIS — M79672 Pain in left foot: Secondary | ICD-10-CM | POA: Diagnosis not present

## 2021-09-18 DIAGNOSIS — E109 Type 1 diabetes mellitus without complications: Secondary | ICD-10-CM | POA: Diagnosis not present

## 2021-10-18 DIAGNOSIS — E109 Type 1 diabetes mellitus without complications: Secondary | ICD-10-CM | POA: Diagnosis not present

## 2021-10-21 DIAGNOSIS — E119 Type 2 diabetes mellitus without complications: Secondary | ICD-10-CM | POA: Diagnosis not present

## 2021-10-21 DIAGNOSIS — S93602D Unspecified sprain of left foot, subsequent encounter: Secondary | ICD-10-CM | POA: Diagnosis not present

## 2021-10-21 DIAGNOSIS — M79672 Pain in left foot: Secondary | ICD-10-CM | POA: Diagnosis not present

## 2021-10-25 ENCOUNTER — Other Ambulatory Visit: Payer: Self-pay | Admitting: Family Medicine

## 2021-10-25 DIAGNOSIS — F320 Major depressive disorder, single episode, mild: Secondary | ICD-10-CM

## 2021-10-25 DIAGNOSIS — F419 Anxiety disorder, unspecified: Secondary | ICD-10-CM

## 2021-11-08 ENCOUNTER — Ambulatory Visit
Admission: RE | Admit: 2021-11-08 | Discharge: 2021-11-08 | Disposition: A | Discharge status: Home / Self Care | Payer: BC Managed Care – PPO | Source: Ambulatory Visit | Attending: Podiatry | Admitting: Podiatry

## 2021-11-08 DIAGNOSIS — M79672 Pain in left foot: Secondary | ICD-10-CM

## 2021-11-08 DIAGNOSIS — M85672 Other cyst of bone, left ankle and foot: Secondary | ICD-10-CM | POA: Diagnosis not present

## 2021-11-08 DIAGNOSIS — L02612 Cutaneous abscess of left foot: Secondary | ICD-10-CM

## 2021-11-11 DIAGNOSIS — E119 Type 2 diabetes mellitus without complications: Secondary | ICD-10-CM | POA: Diagnosis not present

## 2021-11-11 DIAGNOSIS — G5762 Lesion of plantar nerve, left lower limb: Secondary | ICD-10-CM | POA: Diagnosis not present

## 2021-11-18 DIAGNOSIS — E109 Type 1 diabetes mellitus without complications: Secondary | ICD-10-CM | POA: Diagnosis not present

## 2021-12-18 DIAGNOSIS — E109 Type 1 diabetes mellitus without complications: Secondary | ICD-10-CM | POA: Diagnosis not present

## 2021-12-23 ENCOUNTER — Other Ambulatory Visit: Payer: Self-pay

## 2021-12-23 DIAGNOSIS — E119 Type 2 diabetes mellitus without complications: Secondary | ICD-10-CM

## 2021-12-26 LAB — CMP12+LP+TP+TSH+6AC+PSA+CBC…
ALT: 36 IU/L (ref 0–44)
AST: 27 IU/L (ref 0–40)
Albumin/Globulin Ratio: 1.6 (ref 1.2–2.2)
Albumin: 4.4 g/dL (ref 4.0–5.0)
Alkaline Phosphatase: 74 IU/L (ref 44–121)
BUN/Creatinine Ratio: 20 (ref 9–20)
BUN: 21 mg/dL (ref 6–24)
Basophils Absolute: 0.1 10*3/uL (ref 0.0–0.2)
Basos: 1 %
Bilirubin Total: 0.2 mg/dL (ref 0.0–1.2)
Calcium: 9.7 mg/dL (ref 8.7–10.2)
Chloride: 100 mmol/L (ref 96–106)
Chol/HDL Ratio: 4.9 ratio (ref 0.0–5.0)
Cholesterol, Total: 207 mg/dL — ABNORMAL HIGH (ref 100–199)
Creatinine, Ser: 1.03 mg/dL (ref 0.76–1.27)
EOS (ABSOLUTE): 0.2 10*3/uL (ref 0.0–0.4)
Eos: 2 %
Estimated CHD Risk: 1 times avg. (ref 0.0–1.0)
Free Thyroxine Index: 1.3 (ref 1.2–4.9)
GGT: 57 IU/L (ref 0–65)
Globulin, Total: 2.8 g/dL (ref 1.5–4.5)
Glucose: 169 mg/dL — ABNORMAL HIGH (ref 70–99)
HDL: 42 mg/dL (ref >39)
Hematocrit: 42.6 % (ref 37.5–51.0)
Hemoglobin: 14.8 g/dL (ref 13.0–17.7)
Immature Grans (Abs): 0 10*3/uL (ref 0.0–0.1)
Immature Granulocytes: 0 %
Iron: 65 ug/dL (ref 38–169)
LDH: 165 IU/L (ref 121–224)
LDL Chol Calc (NIH): 137 mg/dL — ABNORMAL HIGH (ref 0–99)
Lymphocytes Absolute: 2.5 10*3/uL (ref 0.7–3.1)
Lymphs: 34 %
MCH: 31.2 pg (ref 26.6–33.0)
MCHC: 34.7 g/dL (ref 31.5–35.7)
MCV: 90 fL (ref 79–97)
Monocytes Absolute: 0.6 10*3/uL (ref 0.1–0.9)
Monocytes: 8 %
Neutrophils Absolute: 3.9 10*3/uL (ref 1.4–7.0)
Neutrophils: 55 %
Phosphorus: 4.3 mg/dL — ABNORMAL HIGH (ref 2.8–4.1)
Platelets: 261 10*3/uL (ref 150–450)
Potassium: 4.8 mmol/L (ref 3.5–5.2)
Prostate Specific Ag, Serum: 0.6 ng/mL (ref 0.0–4.0)
RBC: 4.74 x10E6/uL (ref 4.14–5.80)
RDW: 13.6 % (ref 11.6–15.4)
Sodium: 137 mmol/L (ref 134–144)
T3 Uptake Ratio: 22 % — ABNORMAL LOW (ref 24–39)
T4, Total: 6 ug/dL (ref 4.5–12.0)
TSH: 1.45 u[IU]/mL (ref 0.450–4.500)
Total Protein: 7.2 g/dL (ref 6.0–8.5)
Triglycerides: 155 mg/dL — ABNORMAL HIGH (ref 0–149)
Uric Acid: 3.8 mg/dL (ref 3.8–8.4)
VLDL Cholesterol Cal: 28 mg/dL (ref 5–40)
WBC: 7.2 10*3/uL (ref 3.4–10.8)
eGFR: 90 mL/min/{1.73_m2} (ref >59)

## 2021-12-26 LAB — HGB A1C W/O EAG: Hgb A1c MFr Bld: 9.7 % — ABNORMAL HIGH (ref 4.8–5.6)

## 2021-12-26 LAB — TESTOSTERONE,FREE AND TOTAL
Testosterone, Free: 9.7 pg/mL (ref 6.8–21.5)
Testosterone: 286 ng/dL (ref 264–916)

## 2021-12-26 LAB — VITAMIN D 25 HYDROXY (VIT D DEFICIENCY, FRACTURES): Vit D, 25-Hydroxy: 31.9 ng/mL (ref 30.0–100.0)

## 2021-12-29 ENCOUNTER — Encounter: Payer: Self-pay | Admitting: Nurse Practitioner

## 2021-12-30 ENCOUNTER — Ambulatory Visit (INDEPENDENT_AMBULATORY_CARE_PROVIDER_SITE_OTHER): Payer: BC Managed Care – PPO | Admitting: Family Medicine

## 2021-12-30 ENCOUNTER — Encounter: Payer: Self-pay | Admitting: Family Medicine

## 2021-12-30 VITALS — BP 130/74 | HR 88 | Resp 16 | Ht 72.0 in | Wt 290.0 lb

## 2021-12-30 DIAGNOSIS — H6992 Unspecified Eustachian tube disorder, left ear: Secondary | ICD-10-CM

## 2021-12-30 DIAGNOSIS — E66813 Obesity, class 3: Secondary | ICD-10-CM

## 2021-12-30 DIAGNOSIS — E291 Testicular hypofunction: Secondary | ICD-10-CM

## 2021-12-30 DIAGNOSIS — J301 Allergic rhinitis due to pollen: Secondary | ICD-10-CM

## 2021-12-30 DIAGNOSIS — H6982 Other specified disorders of Eustachian tube, left ear: Secondary | ICD-10-CM

## 2021-12-30 DIAGNOSIS — Z0000 Encounter for general adult medical examination without abnormal findings: Secondary | ICD-10-CM

## 2021-12-30 DIAGNOSIS — E118 Type 2 diabetes mellitus with unspecified complications: Secondary | ICD-10-CM

## 2021-12-30 DIAGNOSIS — I10 Essential (primary) hypertension: Secondary | ICD-10-CM

## 2021-12-30 DIAGNOSIS — G4733 Obstructive sleep apnea (adult) (pediatric): Secondary | ICD-10-CM

## 2021-12-30 DIAGNOSIS — F320 Major depressive disorder, single episode, mild: Secondary | ICD-10-CM

## 2021-12-30 DIAGNOSIS — Z6841 Body Mass Index (BMI) 40.0 and over, adult: Secondary | ICD-10-CM

## 2021-12-30 DIAGNOSIS — N50811 Right testicular pain: Secondary | ICD-10-CM

## 2021-12-30 MED ORDER — TRULICITY 0.75 MG/0.5ML ~~LOC~~ SOAJ: 0.7500 mg | SUBCUTANEOUS | 5 refills | Status: DC

## 2021-12-30 NOTE — Progress Notes (Signed)
Complete physical exam  I,[name],acting as a scribe for Wilhemena Durie, MD.,have documented all relevant documentation on the behalf of Wilhemena Durie, MD,as directed by  Wilhemena Durie, MD while in the presence of Wilhemena Durie, MD.   Patient: [name] [name].   DOB: 1975-10-12   46 y.o. Male  MRN: [id number] Visit Date: 12/30/2021  Today's healthcare provider: Wilhemena Durie, MD   Chief Complaint  Patient presents with   Annual Exam   Subjective    [name] [name]. is a 46 y.o. male who presents today for a complete physical exam.  He reports consuming a general diet. Home exercise routine includes walking. He generally feels well. He reports sleeping fairly well. He does not have additional problems to discuss today. He is now working [employer] at [employer]. HPI  He complains of popping and discomfort in the left ear that is relieved after a few seconds of trying to decompress the ear. Complains of occasional pain in the right testicular area with lifting heavy objects at times.  No pain with weightbearing and no other GU symptoms. Past Medical History:  Diagnosis Date   Depressed    Depression    GERD (gastroesophageal reflux disease)    Headache(784.0)    Hypertension    Sleep apnea    cpap   Past Surgical History:  Procedure Laterality Date   BACK SURGERY     COLONOSCOPY N/A 05/06/2021   Procedure: COLONOSCOPY;  Surgeon: Virgel Manifold, MD;  Location: [facility];  Service: Gastroenterology;  Laterality: N/A;   LUMBAR DISC SURGERY     LUMBAR DISC SURGERY     bulging disc repaired   LUMBAR SPINE SURGERY  11/08/2011   LUMBAR WOUND DEBRIDEMENT  11/23/2011   Procedure: LUMBAR WOUND DEBRIDEMENT;  Surgeon: Floyce Stakes, MD;  Location: [facility];  Service: Neurosurgery;  Laterality: N/A;  Incision and drainage of lumbar wound   spine     spine spurs cleaned up and arthritis   Social History   Socioeconomic  History   Marital status: Married    Spouse name: Not on file   Number of children: Not on file   Years of education: Not on file   Highest education level: Not on file  Occupational History   Not on file  Tobacco Use   Smoking status: Former    Packs/day: 0.30    Years: 3.00    Total pack years: 0.90    Types: Cigarettes    Quit date: 08/09/1992    Years since quitting: 29.4   Smokeless tobacco: Current    Types: Chew  Vaping Use   Vaping Use: Never used  Substance and Sexual Activity   Alcohol use: Yes    Alcohol/week: 3.0 - 4.0 standard drinks of alcohol    Types: 3 - 4 Cans of beer per week    Comment: occasional   Drug use: No   Sexual activity: Yes  Other Topics Concern   Not on file  Social History Narrative   Not on file   Social Determinants of Health   Financial Resource Strain: Not on file  Food Insecurity: Not on file  Transportation Needs: Not on file  Physical Activity: Not on file  Stress: Not on file  Social Connections: Not on file  Intimate Partner Violence: Not on file   Family Status  Relation Name Status   Mother  Alive   Father  Alive   Other  (Not Specified)   MGM  (Not Specified)   MGF  (Not Specified)   Family History  Problem Relation Age of Onset   Diabetes Mother    Hypertension Mother    Hyperlipidemia Mother    CAD Other    Irregular heart beat Maternal Grandmother    Heart attack Maternal Grandfather    Allergies  Allergen Reactions   Penicillin G Anaphylaxis   Penicillins Anaphylaxis   Shellfish Allergy Anaphylaxis   Yellow Jacket Venom Shortness Of Breath    Patient Care Team: Jerrol Banana., MD as PCP - General (Unknown Physician Specialty)   Medications: Outpatient Medications Prior to Visit  Medication Sig   cetirizine (ZYRTEC) 10 MG tablet Take 10 mg by mouth daily.   cholecalciferol (VITAMIN D3) 25 MCG (1000 UNIT) tablet Take 1,000 Units by mouth daily.   diazepam (VALIUM) 5 MG tablet TAKE 1 TABLET  (5 MG TOTAL) BY MOUTH EVERY 12 (TWELVE) HOURS AS NEEDED FOR ANXIETY.   metFORMIN (GLUCOPHAGE) 1000 MG tablet Take 1 tablet (1,000 mg total) by mouth 2 (two) times daily with a meal. Take daily with breakfast and at dinner   Multiple Vitamin (MULTIVITAMIN PO) Take by mouth.   sertraline (ZOLOFT) 100 MG tablet TAKE 1 TABLET BY MOUTH EVERY DAY   Sodium Sulfate-Mag Sulfate-KCl (SUTAB) [phone] MG TABS At 5 PM take 12 tablets using the 8 oz cup provided in the kit drinking 5 cups of water and 5 hours before your procedure repeat the same process.   EPINEPHRINE, ANAPHYLAXIS THERAPY AGENTS, EPIPEN 2-PAK, 0.3MG/0.3ML (Injection Device)  1 Device as directed for 0 days  Quantity: 1;  Refills: 12   Ordered :05-Mar-2012  Althea Charon ;  Started 05-Mar-2012 Active (Patient not taking: Reported on 03/01/2021)   [DISCONTINUED] methocarbamol (ROBAXIN) 750 MG tablet Take 1 tablet (750 mg total) by mouth 4 (four) times daily. (Patient not taking: Reported on 03/01/2021)   No facility-administered medications prior to visit.    Review of Systems  All other systems reviewed and are negative.   Last hemoglobin A1c Lab Results  Component Value Date   HGBA1C 9.7 (H) 12/23/2021      Objective     BP 130/74 (BP Location: Right Arm, Patient Position: Sitting, Cuff Size: Large)   Pulse 88   Resp 16   Ht 6' (1.829 m)   Wt 290 lb (131.5 kg)   SpO2 98%   BMI 39.33 kg/m  BP Readings from Last 3 Encounters:  12/30/21 130/74  09/01/21 134/86  05/06/21 131/78   Wt Readings from Last 3 Encounters:  12/30/21 290 lb (131.5 kg)  05/06/21 290 lb (131.5 kg)  03/01/21 (!) 303 lb (137.4 kg)       Physical Exam Vitals reviewed.  Constitutional:      Appearance: He is well-developed. He is obese.     Comments: Pleasant, cooperative, morbidly obese white male in no acute distress.  HENT:     Head: Normocephalic and atraumatic.     Right Ear: Tympanic membrane and external ear normal.     Left Ear:  Tympanic membrane and external ear normal.     Nose: Nose normal.  Eyes:     Conjunctiva/sclera: Conjunctivae normal.     Pupils: Pupils are equal, round, and reactive to light.  Cardiovascular:     Rate and Rhythm: Normal rate and regular rhythm.     Heart sounds: Normal heart sounds.  Pulmonary:  Effort: Pulmonary effort is normal.     Breath sounds: Normal breath sounds.  Abdominal:     General: Bowel sounds are normal.     Palpations: Abdomen is soft.  Genitourinary:    Penis: Normal.      Testes: Normal.  Musculoskeletal:        General: Normal range of motion.     Cervical back: Normal range of motion and neck supple.  Skin:    General: Skin is warm and dry.  Neurological:     General: No focal deficit present.     Mental Status: He is alert and oriented to person, place, and time.  Psychiatric:        Mood and Affect: Mood normal.        Behavior: Behavior normal.        Thought Content: Thought content normal.        Judgment: Judgment normal.       Last depression screening scores    10/27/2020    8:23 AM 05/19/2020    9:45 AM 03/02/2017   11:23 AM  PHQ 2/9 Scores  PHQ - 2 Score 0 0 0  PHQ- 9 Score 0 1 0   Last fall risk screening    05/17/2016    2:05 PM  Fall Risk   Falls in the past year? Yes  Number falls in past yr: 1  Injury with Fall? No   Last Audit-C alcohol use screening    10/27/2020    8:23 AM  Alcohol Use Disorder Test (AUDIT)  1. How often do you have a drink containing alcohol? 1  2. How many drinks containing alcohol do you have on a typical day when you are drinking? 1  3. How often do you have six or more drinks on one occasion? 0  AUDIT-C Score 2   A score of 3 or more in women, and 4 or more in men indicates increased risk for alcohol abuse, EXCEPT if all of the points are from question 1   No results found for any visits on 12/30/21.  Assessment & Plan    Routine Health Maintenance and Physical Exam  Exercise  Activities and Dietary recommendations  Goals   None     Immunization History  Administered Date(s) Administered   Influenza,inj,Quad PF,6+ Mos 04/10/2018, 04/02/2019   Influenza-Unspecified 02/28/2017, 03/11/2020   PFIZER(Purple Top)SARS-COV-2 Vaccination 08/30/2019, 09/25/2019, 05/19/2020   Tdap 07/03/2012    Health Maintenance  Topic Date Due   HIV Screening  Never done   Diabetic kidney evaluation - Urine ACR  Never done   Hepatitis C Screening  Never done   COVID-19 Vaccine (4 - Pfizer series) 07/14/2020   INFLUENZA VACCINE  01/18/2022   TETANUS/TDAP  07/03/2022   Diabetic kidney evaluation - GFR measurement  12/24/2022   COLONOSCOPY (Pts 45-89yr Insurance coverage will need to be confirmed)  05/06/2024   HPV VACCINES  Aged Out    Discussed health benefits of physical activity, and encouraged him to engage in regular exercise appropriate for his age and condition.  1. Annual physical exam Follow-up 1 year   2. Controlled type 2 diabetes mellitus with complication, without long-term current use of insulin ([place] At this time start Trulicity low-dose and follow-up in 1 to 2 months to advance dose or to tolerance and control of diabetes - Dulaglutide (TRULICITY) 03.15MVV/6.1YWSOPN; Inject 0.75 mg into the skin once a week.  Dispense: 2 mL; Refill: 5  3. Essential (primary)  hypertension Good blood pressure control  4. Obstructive sleep apnea syndrome Wears CPAP nightly  5. Seasonal allergic rhinitis due to pollen Patient with left eustachian tube dysfunction description which I think is related to his allergies.  No infection present.  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  I think this will recover as he loses weight  7. Depression, major, single episode, mild (HCC) Completely stable on sertraline 100  8. Class 3 severe obesity due to excess calories with serious comorbidity and body mass index (BMI) of 40.0 to 44.9 in adult  [facility]) Diet and exercise weight loss stressed.  Trulicity started  9. Dysfunction of left eustachian tube Due to allergies  10. Pain in right testicle I think this is a strain type issue with intermittent testicular pain with physical stress with heavy lifting.  No hernia.  Follow-up on an as-needed basis for this.  Normal testicular exam.  His only with heavy lifting that he notices discomfort and it is fleeting   Return in about 8 weeks (around 02/24/2022).     I, Wilhemena Durie, MD, have reviewed all documentation for this visit. The documentation on 12/31/21 for the exam, diagnosis, procedures, and orders are all accurate and complete.    Dezmin Kittelson Cranford Mon, MD  [facility] [phone] (phone) [phone] (fax)  [place]

## 2022-01-01 ENCOUNTER — Encounter: Payer: Self-pay | Admitting: Family Medicine

## 2022-01-03 ENCOUNTER — Other Ambulatory Visit: Payer: Self-pay | Admitting: *Deleted

## 2022-01-03 DIAGNOSIS — F419 Anxiety disorder, unspecified: Secondary | ICD-10-CM

## 2022-01-04 MED ORDER — DIAZEPAM 5 MG PO TABS: 5.0000 mg | ORAL_TABLET | Freq: Two times a day (BID) | ORAL | 1 refills | Status: AC | PRN

## 2022-01-11 ENCOUNTER — Encounter: Payer: BC Managed Care – PPO | Admitting: Family Medicine

## 2022-01-18 DIAGNOSIS — E109 Type 1 diabetes mellitus without complications: Secondary | ICD-10-CM | POA: Diagnosis not present

## 2022-01-24 ENCOUNTER — Other Ambulatory Visit: Payer: Self-pay | Admitting: Family Medicine

## 2022-01-24 DIAGNOSIS — E118 Type 2 diabetes mellitus with unspecified complications: Secondary | ICD-10-CM

## 2022-01-24 DIAGNOSIS — F320 Major depressive disorder, single episode, mild: Secondary | ICD-10-CM

## 2022-01-24 DIAGNOSIS — F419 Anxiety disorder, unspecified: Secondary | ICD-10-CM

## 2022-02-08 ENCOUNTER — Ambulatory Visit (INDEPENDENT_AMBULATORY_CARE_PROVIDER_SITE_OTHER): Payer: Self-pay | Admitting: Family

## 2022-02-08 ENCOUNTER — Encounter: Payer: Self-pay | Admitting: Nurse Practitioner

## 2022-02-08 VITALS — BP 140/90 | HR 96 | Temp 99.4°F | Ht 72.0 in | Wt 300.0 lb

## 2022-02-08 DIAGNOSIS — T63481A Toxic effect of venom of other arthropod, accidental (unintentional), initial encounter: Secondary | ICD-10-CM

## 2022-02-08 MED ORDER — EPINEPHRINE 0.3 MG/0.3ML IJ SOAJ: 0.3000 mg | INTRAMUSCULAR | 3 refills | Status: AC | PRN

## 2022-02-08 MED ORDER — SULFAMETHOXAZOLE-TRIMETHOPRIM 800-160 MG PO TABS: 1.0000 | ORAL_TABLET | Freq: Two times a day (BID) | ORAL | 0 refills | Status: AC

## 2022-02-08 NOTE — Progress Notes (Signed)
[place], [place] [address]   Office Visit Note  Patient Name: [name] [name]  [id number]  [id number]  Date of Service: 02/08/2022  Chief Complaint  Patient presents with   Insect Bite    Was stung by yellow jackets last night. Hand and both legs. Swollen right hand. No breathing issues but is very itchy. Pt has been taking benadryl.      HPI Pt was doing yard work last night when he was stung several times.  Has been taking Benadryl and one dose of his daily Zyrtec (this morning).  Itching a lot.  He is going home now to change out of long heavy work pants.  Has been prescribed epi pen before but it was too expensive.     Current Medication:  Outpatient Encounter Medications as of 02/08/2022  Medication Sig   cetirizine (ZYRTEC) 10 MG tablet Take 10 mg by mouth daily.   cholecalciferol (VITAMIN D3) 25 MCG (1000 UNIT) tablet Take 1,000 Units by mouth daily.   diazepam (VALIUM) 5 MG tablet Take 1 tablet (5 mg total) by mouth every 12 (twelve) hours as needed for anxiety.   Dulaglutide (TRULICITY) 7.15 NB/3.9YD SOPN Inject 0.75 mg into the skin once a week.   metFORMIN (GLUCOPHAGE) 1000 MG tablet TAKE 1 TABLET BY MOUTH 2 (TWO) TIMES DAILY WITH A MEAL. TAKE DAILY WITH BREAKFAST AND AT DINNER   Multiple Vitamin (MULTIVITAMIN PO) Take by mouth.   sertraline (ZOLOFT) 100 MG tablet TAKE 1 TABLET BY MOUTH EVERY DAY   EPINEPHRINE, ANAPHYLAXIS THERAPY AGENTS, EPIPEN 2-PAK, 0.$RemoveBeforeD'3MG'MdztdSQXGmCMmJ$ /0.3ML (Injection Device)  1 Device as directed for 0 days  Quantity: 1;  Refills: 12   Ordered :05-Mar-2012  Althea Charon ;  Started 05-Mar-2012 Active (Patient not taking: Reported on 03/01/2021)   Sodium Sulfate-Mag Sulfate-KCl (SUTAB) [phone] MG TABS At 5 PM take 12 tablets using the 8 oz cup provided in the kit drinking 5 cups of water and 5 hours before your procedure repeat the same process. (Patient not taking: Reported on 02/08/2022)   No  facility-administered encounter medications on file as of 02/08/2022.      Medical History: Past Medical History:  Diagnosis Date   Depressed    Depression    GERD (gastroesophageal reflux disease)    Headache(784.0)    Hypertension    Sleep apnea    cpap     Vital Signs: BP (!) 140/90 (BP Location: Left Arm, Patient Position: Sitting, Cuff Size: Normal)   Pulse 96   Temp 99.4 F (37.4 C) (Tympanic)   Ht 6' (1.829 m)   Wt 300 lb (136.1 kg)   SpO2 97%   BMI 40.69 kg/m    Review of Systems  Constitutional: Negative.   HENT: Negative.    Eyes: Negative.   Respiratory: Negative.    Skin:  Positive for color change.    Physical Exam Constitutional:      Appearance: Normal appearance. He is normal weight.  HENT:     Mouth/Throat:     Mouth: Mucous membranes are dry.     Pharynx: Oropharynx is clear.  Pulmonary:     Effort: Pulmonary effort is normal.  Skin:    Comments: Right hand moderately swollen and red. No visible sting wound.  Several pin point erythematous flat wounds to bilateral lower extremities.    Neurological:     Mental Status: He is alert.       Assessment/Plan: Reviewed exam findings with pt.  Start icing right hand to decrease swelling.  Switch to otc Claritin 2 tablets bid.  Start Pepcid bid.  May use otc topical hydrocortisone for acute itch relief. Re-prescribed Epi pen after discussing risk of future exposure to insect stings.  Also sent Bactrim DS to hold for worsening/persistent right hand swelling/redness to cover for possible cellulitis.  Rtc with any persistent concerns.  General Counseling: [name] [name] understanding of the findings of todays visit and agrees with plan of treatment. I have discussed any further diagnostic evaluation that may be needed or ordered today. We also reviewed his medications today. he has been encouraged to call the office with any questions or concerns that should arise related to todays  visit.    Time spent: 10 Minutes  [name], FNP-C

## 2022-02-18 DIAGNOSIS — E109 Type 1 diabetes mellitus without complications: Secondary | ICD-10-CM | POA: Diagnosis not present

## 2022-02-24 ENCOUNTER — Ambulatory Visit: Payer: BC Managed Care – PPO | Admitting: Family Medicine

## 2022-02-24 ENCOUNTER — Encounter: Payer: Self-pay | Admitting: Family Medicine

## 2022-02-24 VITALS — BP 134/84 | HR 91 | Resp 16 | Wt 293.0 lb

## 2022-02-24 DIAGNOSIS — E118 Type 2 diabetes mellitus with unspecified complications: Secondary | ICD-10-CM | POA: Diagnosis not present

## 2022-02-24 DIAGNOSIS — I10 Essential (primary) hypertension: Secondary | ICD-10-CM | POA: Diagnosis not present

## 2022-02-24 DIAGNOSIS — F32A Depression, unspecified: Secondary | ICD-10-CM

## 2022-02-24 DIAGNOSIS — Z23 Encounter for immunization: Secondary | ICD-10-CM | POA: Diagnosis not present

## 2022-02-24 DIAGNOSIS — G4733 Obstructive sleep apnea (adult) (pediatric): Secondary | ICD-10-CM

## 2022-02-24 DIAGNOSIS — Z6839 Body mass index (BMI) 39.0-39.9, adult: Secondary | ICD-10-CM

## 2022-02-24 DIAGNOSIS — F419 Anxiety disorder, unspecified: Secondary | ICD-10-CM

## 2022-02-24 DIAGNOSIS — E66812 Obesity, class 2: Secondary | ICD-10-CM

## 2022-02-24 MED ORDER — TRULICITY 1.5 MG/0.5ML ~~LOC~~ SOAJ: 1.5000 mg | SUBCUTANEOUS | 3 refills | Status: DC

## 2022-02-24 NOTE — Progress Notes (Unsigned)
Established patient visit  I,[name],acting as a scribe for Wilhemena Durie, MD.,have documented all relevant documentation on the behalf of Wilhemena Durie, MD,as directed by  Wilhemena Durie, MD while in the presence of Wilhemena Durie, MD.   Patient: [name] [name].   DOB: 1976/02/19   46 y.o. Male  MRN: [id number] Visit Date: 02/24/2022  Today's healthcare provider: Wilhemena Durie, MD   Chief Complaint  Patient presents with   Follow-up   Diabetes   Subjective    HPI  Diabetes Mellitus Type II, Follow-up  Lab Results  Component Value Date   HGBA1C 9.7 (H) 12/23/2021   HGBA1C 9.5 (H) 09/06/2021   HGBA1C 8.8 (H) 03/01/2021   Wt Readings from Last 3 Encounters:  02/24/22 293 lb (132.9 kg)  02/08/22 300 lb (136.1 kg)  12/30/21 290 lb (131.5 kg)   Last seen for diabetes 2 months ago.  Management since then includes; started Trulicity.   Home blood sugar records: fasting range: not checking  Most Recent Eye Exam: due  Pertinent Labs: Lab Results  Component Value Date   CHOL 207 (H) 12/23/2021   HDL 42 12/23/2021   LDLCALC 137 (H) 12/23/2021   TRIG 155 (H) 12/23/2021   CHOLHDL 4.9 12/23/2021   Lab Results  Component Value Date   NA 137 12/23/2021   K 4.8 12/23/2021   CREATININE 1.03 12/23/2021   EGFR 90 12/23/2021     ---------------------------------------------------------------------------------------------------   Medications: Outpatient Medications Prior to Visit  Medication Sig   cetirizine (ZYRTEC) 10 MG tablet Take 10 mg by mouth daily.   cholecalciferol (VITAMIN D3) 25 MCG (1000 UNIT) tablet Take 1,000 Units by mouth daily.   diazepam (VALIUM) 5 MG tablet Take 1 tablet (5 mg total) by mouth every 12 (twelve) hours as needed for anxiety.   Dulaglutide (TRULICITY) 1.91 YN/8.2NF SOPN Inject 0.75 mg into the skin once a week.   metFORMIN (GLUCOPHAGE) 1000 MG tablet TAKE 1 TABLET BY MOUTH 2 (TWO) TIMES DAILY WITH A  MEAL. TAKE DAILY WITH BREAKFAST AND AT DINNER   Multiple Vitamin (MULTIVITAMIN PO) Take by mouth.   sertraline (ZOLOFT) 100 MG tablet TAKE 1 TABLET BY MOUTH EVERY DAY   Sodium Sulfate-Mag Sulfate-KCl (SUTAB) [phone] MG TABS At 5 PM take 12 tablets using the 8 oz cup provided in the kit drinking 5 cups of water and 5 hours before your procedure repeat the same process.   No facility-administered medications prior to visit.    Review of Systems  Last hemoglobin A1c Lab Results  Component Value Date   HGBA1C 9.7 (H) 12/23/2021       Objective    BP 134/84 (BP Location: Right Arm, Patient Position: Sitting, Cuff Size: Large)   Pulse 91   Resp 16   Wt 293 lb (132.9 kg)   SpO2 95%   BMI 39.74 kg/m  BP Readings from Last 3 Encounters:  02/24/22 134/84  02/08/22 (!) 140/90  12/30/21 130/74   Wt Readings from Last 3 Encounters:  02/24/22 293 lb (132.9 kg)  02/08/22 300 lb (136.1 kg)  12/30/21 290 lb (131.5 kg)      Physical Exam  ***  No results found for any visits on 02/24/22.  Assessment & Plan     1. Controlled type 2 diabetes mellitus with complication, without long-term current use of insulin (HCC) Increased Trulicity to 1.5 mg weekly. - Dulaglutide (TRULICITY) 1.5 IO/9.6EX SOPN; Inject 1.5 mg into  the skin once a week.  Dispense: 6 mL; Refill: 3  2. Need for influenza vaccination  - Flu Vaccine QUAD 6+ mos PF IM (Fluarix Quad PF)   No follow-ups on file.         Virginie Josten Cranford Mon, MD  [facility] [phone] (phone) [phone] (fax)  [place]

## 2022-03-20 DIAGNOSIS — E109 Type 1 diabetes mellitus without complications: Secondary | ICD-10-CM | POA: Diagnosis not present

## 2022-04-19 ENCOUNTER — Telehealth: Payer: Self-pay | Admitting: Family Medicine

## 2022-04-19 DIAGNOSIS — F32A Depression, unspecified: Secondary | ICD-10-CM

## 2022-04-19 DIAGNOSIS — F320 Major depressive disorder, single episode, mild: Secondary | ICD-10-CM

## 2022-04-19 MED ORDER — SERTRALINE HCL 100 MG PO TABS: 100.0000 mg | ORAL_TABLET | Freq: Every day | ORAL | 0 refills | Status: DC

## 2022-04-19 NOTE — Telephone Encounter (Signed)
Medication Refill - Medication: sertraline (ZOLOFT) 100 MG tablet   Has the patient contacted their pharmacy? Yes.   (Agent: If no, request that the patient contact the pharmacy for the refill. If patient does not wish to contact the pharmacy document the reason why and proceed with request.) (Agent: If yes, when and what did the pharmacy advise?)  Preferred Pharmacy (with phone number or street name):  [id number] - [place], [place] - [address]  [address] [place] [address]  Phone: [phone] Fax: [phone]   Has the patient been seen for an appointment in the last year OR does the patient have an upcoming appointment? Yes.    Agent: Please be advised that RX refills may take up to 3 business days. We ask that you follow-up with your pharmacy.

## 2022-04-20 DIAGNOSIS — E109 Type 1 diabetes mellitus without complications: Secondary | ICD-10-CM | POA: Diagnosis not present

## 2022-05-17 DIAGNOSIS — E113393 Type 2 diabetes mellitus with moderate nonproliferative diabetic retinopathy without macular edema, bilateral: Secondary | ICD-10-CM | POA: Diagnosis not present

## 2022-05-20 DIAGNOSIS — E109 Type 1 diabetes mellitus without complications: Secondary | ICD-10-CM | POA: Diagnosis not present

## 2022-06-20 DIAGNOSIS — E109 Type 1 diabetes mellitus without complications: Secondary | ICD-10-CM | POA: Diagnosis not present

## 2022-06-30 ENCOUNTER — Telehealth: Payer: Self-pay | Admitting: Family Medicine

## 2022-06-30 DIAGNOSIS — E118 Type 2 diabetes mellitus with unspecified complications: Secondary | ICD-10-CM

## 2022-06-30 NOTE — Telephone Encounter (Signed)
Medication Refill - Medication: Dulaglutide (TRULICITY) 1.5 MW/1.0UV SOPN   Has the patient contacted their pharmacy? Yes.     Preferred Pharmacy (with phone number or street name):  [id number] - [place], [place] - [address] Phone: [phone]  Fax: [phone]     Has the patient been seen for an appointment in the last year OR does the patient have an upcoming appointment? Yes.     The patient will be out by Sunday. Please assist patient further

## 2022-07-01 ENCOUNTER — Telehealth: Payer: Self-pay

## 2022-07-01 MED ORDER — TRULICITY 1.5 MG/0.5ML ~~LOC~~ SOAJ: 1.5000 mg | SUBCUTANEOUS | 3 refills | Status: DC

## 2022-07-01 NOTE — Telephone Encounter (Signed)
Pt called back after speaking with [facility] clinic. PT made New pt appt with Dr. Quentin Cornwall.

## 2022-07-01 NOTE — Telephone Encounter (Signed)
noted 

## 2022-07-01 NOTE — Telephone Encounter (Signed)
Requested medications are due for refill today.  yes  Requested medications are on the active medications list.  yes  Last refill. 02/24/2022 46mL 3 rf  Future visit scheduled.   no  Notes to clinic.  Called pt. Pt will be following Dr. Rosanna Randy to new practice.    Requested Prescriptions  Pending Prescriptions Disp Refills   Dulaglutide (TRULICITY) 1.5 AJ/2.8NO SOPN 6 mL 3    Sig: Inject 1.5 mg into the skin once a week.     Endocrinology:  Diabetes - GLP-1 Receptor Agonists Failed - 06/30/2022  5:47 PM      Failed - HBA1C is between 0 and 7.9 and within 180 days    Hemoglobin A1C  Date Value Ref Range Status  02/20/2012 5.9 4.2 - 6.3 % Final    Comment:    The American Diabetes Association recommends that a primary goal of therapy should be <7% and that physicians should reevaluate the treatment regimen in patients with HbA1c values consistently >8%.    Hgb A1c MFr Bld  Date Value Ref Range Status  12/23/2021 9.7 (H) 4.8 - 5.6 % Final    Comment:             Prediabetes: 5.7 - 6.4          Diabetes: >6.4          Glycemic control for adults with diabetes: <7.0          Passed - Valid encounter within last 6 months    Recent Outpatient Visits           4 months ago Controlled type 2 diabetes mellitus with complication, without long-term current use of insulin [name])   [name] [name] [name]., MD   6 months ago Annual physical exam   [name] [name] [name]., MD   1 year ago Controlled type 2 diabetes mellitus with complication, without long-term current use of insulin [name])   [name] [name] [name]., MD   1 year ago Controlled type 2 diabetes mellitus with complication, without long-term current use of insulin [name])   [name] [name] [name]., MD   2 years ago Controlled type 2 diabetes mellitus with complication, without long-term current use of insulin [name])    [name] [name] [name]., MD       Future Appointments             In 6 months [name] [name]., MD [name]

## 2022-07-01 NOTE — Telephone Encounter (Signed)
Called pt - PT will be following Dr. Rosanna Randy to the new practice.

## 2022-07-01 NOTE — Telephone Encounter (Signed)
Pt called back and made new pt appt with Dr. Quentin Cornwall.

## 2022-07-07 NOTE — Telephone Encounter (Signed)
Pt's wife called back saying they still can't get the prescription.  She said [facility] saying [facility].  She says in the meantime his glucose is gong up.  Please advise.  CB#  [phone] or [phone]

## 2022-07-08 ENCOUNTER — Other Ambulatory Visit: Payer: Self-pay

## 2022-07-08 ENCOUNTER — Other Ambulatory Visit: Payer: Self-pay | Admitting: Family Medicine

## 2022-07-08 DIAGNOSIS — E118 Type 2 diabetes mellitus with unspecified complications: Secondary | ICD-10-CM

## 2022-07-08 MED ORDER — TRULICITY 1.5 MG/0.5ML ~~LOC~~ SOAJ
1.5000 mg | SUBCUTANEOUS | 3 refills | Status: DC
  Filled 2022-07-08: qty 2, 28d supply, fill #0
  Filled 2022-09-12: qty 2, 28d supply, fill #1
  Filled 2022-10-06: qty 2, 28d supply, fill #2
  Filled 2022-11-03: qty 2, 28d supply, fill #3
  Filled 2022-11-29: qty 2, 28d supply, fill #4
  Filled 2022-12-28: qty 2, 28d supply, fill #5
  Filled 2023-01-22: qty 2, 28d supply, fill #6
  Filled 2023-02-19: qty 2, 28d supply, fill #7
  Filled 2023-03-20: qty 2, 28d supply, fill #8
  Filled 2023-05-13: qty 2, 28d supply, fill #9
  Filled 2023-06-10: qty 2, 28d supply, fill #10

## 2022-07-08 NOTE — Telephone Encounter (Signed)
Patient advised as below.  

## 2022-07-08 NOTE — Telephone Encounter (Signed)
Pt spouse requests that the Rx for Dulaglutide (TRULICITY) 1.5 BM/8.4XL SOPN be sent to  [facility] Phone: [phone]  Fax: [phone]

## 2022-07-08 NOTE — Telephone Encounter (Signed)
Requested Prescriptions  Pending Prescriptions Disp Refills   Dulaglutide (TRULICITY) 1.5 KG/2.5KY SOPN 6 mL 3    Sig: Inject 1.5 mg into the skin once a week.     Endocrinology:  Diabetes - GLP-1 Receptor Agonists Failed - 07/08/2022 12:54 PM      Failed - HBA1C is between 0 and 7.9 and within 180 days    Hemoglobin A1C  Date Value Ref Range Status  02/20/2012 5.9 4.2 - 6.3 % Final    Comment:    The American Diabetes Association recommends that a primary goal of therapy should be <7% and that physicians should reevaluate the treatment regimen in patients with HbA1c values consistently >8%.    Hgb A1c MFr Bld  Date Value Ref Range Status  12/23/2021 9.7 (H) 4.8 - 5.6 % Final    Comment:             Prediabetes: 5.7 - 6.4          Diabetes: >6.4          Glycemic control for adults with diabetes: <7.0          Passed - Valid encounter within last 6 months    Recent Outpatient Visits           4 months ago Controlled type 2 diabetes mellitus with complication, without long-term current use of insulin ([place])   [place] Jerrol Banana., MD   6 months ago Annual physical exam   [facility] Jerrol Banana., MD   1 year ago Controlled type 2 diabetes mellitus with complication, without long-term current use of insulin [facility])   [place] Jerrol Banana., MD   1 year ago Controlled type 2 diabetes mellitus with complication, without long-term current use of insulin [facility])   [place] Jerrol Banana., MD   2 years ago Controlled type 2 diabetes mellitus with complication, without long-term current use of insulin [facility])   [place] Jerrol Banana., MD       Future Appointments             In 3 months Simmons-Robinson, Riki Sheer, MD [facility], [place]   In 6 months Jerrol Banana., MD [facility], [name]

## 2022-07-08 NOTE — Telephone Encounter (Signed)
Have them check with other pharmacies. We are having backorder issues with all the medications in this class. [facility] pharmacy has had better luck having things in stock.

## 2022-07-11 ENCOUNTER — Other Ambulatory Visit: Payer: Self-pay | Admitting: Family Medicine

## 2022-07-11 DIAGNOSIS — F32A Depression, unspecified: Secondary | ICD-10-CM

## 2022-07-11 DIAGNOSIS — F320 Major depressive disorder, single episode, mild: Secondary | ICD-10-CM

## 2022-07-13 ENCOUNTER — Other Ambulatory Visit: Payer: Self-pay | Admitting: Family Medicine

## 2022-07-13 DIAGNOSIS — E118 Type 2 diabetes mellitus with unspecified complications: Secondary | ICD-10-CM

## 2022-07-13 NOTE — Telephone Encounter (Signed)
Medication Refill - Medication: metFORMIN (GLUCOPHAGE) 1000 MG tablet   Has the patient contacted their pharmacy? Yes.   (Agent: If no, request that the patient contact the pharmacy for the refill. If patient does not wish to contact the pharmacy document the reason why and proceed with request.) (Agent: If yes, when and what did the pharmacy advise?)  Preferred Pharmacy (with phone number or street name):  [id number] [place], [place] - [address] Phone: [phone]  Fax: [phone]     Has the patient been seen for an appointment in the last year OR does the patient have an upcoming appointment? Yes.    Agent: Please be advised that RX refills may take up to 3 business days. We ask that you follow-up with your pharmacy.

## 2022-07-13 NOTE — Telephone Encounter (Signed)
Requested medication (s) are due for refill today:due 07/27/22  Requested medication (s) are on the active medication list: yes    Last refill: 01/24/22  #180  1 refill  Future visit scheduled yes 10/12/22  Notes to clinic:Failed due to labs, please review. Thank you.  Requested Prescriptions  Pending Prescriptions Disp Refills   metFORMIN (GLUCOPHAGE) 1000 MG tablet 180 tablet 1     Endocrinology:  Diabetes - Biguanides Failed - 07/13/2022  2:08 PM      Failed - HBA1C is between 0 and 7.9 and within 180 days    Hemoglobin A1C  Date Value Ref Range Status  02/20/2012 5.9 4.2 - 6.3 % Final    Comment:    The American Diabetes Association recommends that a primary goal of therapy should be <7% and that physicians should reevaluate the treatment regimen in patients with HbA1c values consistently >8%.    Hgb A1c MFr Bld  Date Value Ref Range Status  12/23/2021 9.7 (H) 4.8 - 5.6 % Final    Comment:             Prediabetes: 5.7 - 6.4          Diabetes: >6.4          Glycemic control for adults with diabetes: <7.0          Failed - B12 Level in normal range and within 720 days    No results found for: "VITAMINB12"       Failed - CBC within normal limits and completed in the last 12 months    WBC  Date Value Ref Range Status  12/23/2021 7.2 3.4 - 10.8 x10E3/uL Final  05/27/2019 7.2 4.0 - 10.5 K/uL Final   RBC  Date Value Ref Range Status  12/23/2021 4.74 4.14 - 5.80 x10E6/uL Final  05/27/2019 4.82 4.22 - 5.81 MIL/uL Final   Hemoglobin  Date Value Ref Range Status  12/23/2021 14.8 13.0 - 17.7 g/dL Final   Hematocrit  Date Value Ref Range Status  12/23/2021 42.6 37.5 - 51.0 % Final   MCHC  Date Value Ref Range Status  12/23/2021 34.7 31.5 - 35.7 g/dL Final  05/27/2019 34.2 30.0 - 36.0 g/dL Final   [facility]  Date Value Ref Range Status  12/23/2021 31.2 26.6 - 33.0 pg Final  05/27/2019 30.5 26.0 - 34.0 pg Final   MCV  Date Value Ref Range Status  12/23/2021 90 79 - 97  fL Final  02/21/2012 93 80 - 100 fL Final   No results found for: "PLTCOUNTKUC", "LABPLAT", "POCPLA" RDW  Date Value Ref Range Status  12/23/2021 13.6 11.6 - 15.4 % Final  02/21/2012 14.0 11.5 - 14.5 % Final         Passed - Cr in normal range and within 360 days    Creatinine  Date Value Ref Range Status  02/21/2012 1.26 0.60 - 1.30 mg/dL Final   Creatinine, Ser  Date Value Ref Range Status  12/23/2021 1.03 0.76 - 1.27 mg/dL Final         Passed - eGFR in normal range and within 360 days    EGFR (African American)  Date Value Ref Range Status  02/21/2012 >60  Final   GFR calc Af Amer  Date Value Ref Range Status  05/19/2020 119 >59 mL/min/1.73 Final    Comment:    **In accordance with recommendations from the NKF-ASN Task force,**   [facility] is in the process of updating its eGFR calculation to the  2021 CKD-EPI creatinine equation that estimates kidney function   without a race variable.    EGFR (Non-African Amer.)  Date Value Ref Range Status  02/21/2012 >60  Final    Comment:    eGFR values <78mL/min/1.73 m2 may be an indication of chronic kidney disease (CKD). Calculated eGFR is useful in patients with stable renal function. The eGFR calculation will not be reliable in acutely ill patients when serum creatinine is changing rapidly. It is not useful in  patients on dialysis. The eGFR calculation may not be applicable to patients at the low and high extremes of body sizes, pregnant women, and vegetarians.    GFR calc non Af Amer  Date Value Ref Range Status  05/19/2020 103 >59 mL/min/1.73 Final   eGFR  Date Value Ref Range Status  12/23/2021 90 >59 mL/min/1.73 Final         Passed - Valid encounter within last 6 months    Recent Outpatient Visits           4 months ago Controlled type 2 diabetes mellitus with complication, without long-term current use of insulin ([place])   [place] Jerrol Banana., MD   6 months  ago Annual physical exam   [facility] Jerrol Banana., MD   1 year ago Controlled type 2 diabetes mellitus with complication, without long-term current use of insulin [facility])   [place] Jerrol Banana., MD   1 year ago Controlled type 2 diabetes mellitus with complication, without long-term current use of insulin [facility])   [place] Jerrol Banana., MD   2 years ago Controlled type 2 diabetes mellitus with complication, without long-term current use of insulin [facility])   [place] Jerrol Banana., MD       Future Appointments             In 3 months Simmons-Robinson, Riki Sheer, MD [facility], [place]   In 5 months Jerrol Banana., MD [facility], [facility]

## 2022-07-15 MED ORDER — METFORMIN HCL 1000 MG PO TABS: ORAL_TABLET | ORAL | 1 refills | Status: AC

## 2022-07-21 DIAGNOSIS — E109 Type 1 diabetes mellitus without complications: Secondary | ICD-10-CM | POA: Diagnosis not present

## 2022-08-11 DIAGNOSIS — I10 Essential (primary) hypertension: Secondary | ICD-10-CM | POA: Diagnosis not present

## 2022-08-11 DIAGNOSIS — E118 Type 2 diabetes mellitus with unspecified complications: Secondary | ICD-10-CM | POA: Diagnosis not present

## 2022-08-11 DIAGNOSIS — E782 Mixed hyperlipidemia: Secondary | ICD-10-CM | POA: Diagnosis not present

## 2022-08-11 DIAGNOSIS — Z1329 Encounter for screening for other suspected endocrine disorder: Secondary | ICD-10-CM | POA: Diagnosis not present

## 2022-08-16 ENCOUNTER — Other Ambulatory Visit: Payer: Self-pay

## 2022-08-16 MED ORDER — TRULICITY 1.5 MG/0.5ML ~~LOC~~ SOAJ
1.5000 mg | SUBCUTANEOUS | 11 refills | Status: AC
  Filled 2022-08-16: qty 2, 28d supply, fill #0

## 2022-08-19 DIAGNOSIS — E109 Type 1 diabetes mellitus without complications: Secondary | ICD-10-CM | POA: Diagnosis not present

## 2022-09-12 ENCOUNTER — Other Ambulatory Visit: Payer: Self-pay

## 2022-09-19 DIAGNOSIS — E109 Type 1 diabetes mellitus without complications: Secondary | ICD-10-CM | POA: Diagnosis not present

## 2022-09-29 ENCOUNTER — Other Ambulatory Visit: Payer: Self-pay | Admitting: Family Medicine

## 2022-09-29 DIAGNOSIS — F32A Depression, unspecified: Secondary | ICD-10-CM

## 2022-09-29 DIAGNOSIS — F320 Major depressive disorder, single episode, mild: Secondary | ICD-10-CM

## 2022-10-12 ENCOUNTER — Ambulatory Visit: Payer: BC Managed Care – PPO | Admitting: Family Medicine

## 2022-10-19 DIAGNOSIS — E109 Type 1 diabetes mellitus without complications: Secondary | ICD-10-CM | POA: Diagnosis not present

## 2022-11-10 DIAGNOSIS — R0789 Other chest pain: Secondary | ICD-10-CM | POA: Diagnosis not present

## 2022-11-10 DIAGNOSIS — M542 Cervicalgia: Secondary | ICD-10-CM | POA: Diagnosis not present

## 2022-11-10 DIAGNOSIS — E782 Mixed hyperlipidemia: Secondary | ICD-10-CM | POA: Diagnosis not present

## 2022-11-10 DIAGNOSIS — E118 Type 2 diabetes mellitus with unspecified complications: Secondary | ICD-10-CM | POA: Diagnosis not present

## 2022-11-19 DIAGNOSIS — E109 Type 1 diabetes mellitus without complications: Secondary | ICD-10-CM | POA: Diagnosis not present

## 2022-11-22 DIAGNOSIS — R079 Chest pain, unspecified: Secondary | ICD-10-CM | POA: Diagnosis not present

## 2022-11-22 DIAGNOSIS — R0789 Other chest pain: Secondary | ICD-10-CM | POA: Diagnosis not present

## 2022-11-22 DIAGNOSIS — I10 Essential (primary) hypertension: Secondary | ICD-10-CM | POA: Diagnosis not present

## 2022-11-22 DIAGNOSIS — I2089 Other forms of angina pectoris: Secondary | ICD-10-CM | POA: Diagnosis not present

## 2022-11-29 ENCOUNTER — Other Ambulatory Visit: Payer: Self-pay | Admitting: Internal Medicine

## 2022-11-29 DIAGNOSIS — I2089 Other forms of angina pectoris: Secondary | ICD-10-CM

## 2022-11-29 DIAGNOSIS — R0789 Other chest pain: Secondary | ICD-10-CM

## 2022-12-05 DIAGNOSIS — I2089 Other forms of angina pectoris: Secondary | ICD-10-CM | POA: Diagnosis not present

## 2022-12-13 ENCOUNTER — Other Ambulatory Visit (HOSPITAL_COMMUNITY): Payer: Self-pay | Admitting: *Deleted

## 2022-12-13 ENCOUNTER — Telehealth (HOSPITAL_COMMUNITY): Payer: Self-pay | Admitting: *Deleted

## 2022-12-13 ENCOUNTER — Encounter (HOSPITAL_COMMUNITY): Payer: Self-pay

## 2022-12-13 MED ORDER — METOPROLOL TARTRATE 100 MG PO TABS: ORAL_TABLET | ORAL | 0 refills | Status: DC

## 2022-12-13 NOTE — Telephone Encounter (Signed)
Reaching out to patient to offer assistance regarding upcoming cardiac imaging study; pt verbalizes understanding of appt date/time, parking situation and where to check in, pre-test NPO status and medications ordered, and verified current allergies; name and call back number provided for further questions should they arise  Tiffaney Heimann RN Navigator Cardiac Imaging [facility] [phone] office [phone] cell  Patient to take 100mg metoprolol tartrate two hours prior to his cardiac CT scan. 

## 2022-12-14 ENCOUNTER — Ambulatory Visit
Admission: RE | Admit: 2022-12-14 | Discharge: 2022-12-14 | Disposition: A | Discharge status: Home / Self Care | Payer: BC Managed Care – PPO | Source: Ambulatory Visit | Attending: Internal Medicine | Admitting: Internal Medicine

## 2022-12-14 DIAGNOSIS — I2089 Other forms of angina pectoris: Secondary | ICD-10-CM

## 2022-12-14 DIAGNOSIS — R0789 Other chest pain: Secondary | ICD-10-CM

## 2022-12-14 LAB — POCT I-STAT CREATININE: Creatinine, Ser: 1 mg/dL (ref 0.61–1.24)

## 2022-12-14 MED ORDER — NITROGLYCERIN 0.4 MG SL SUBL
0.8000 mg | SUBLINGUAL_TABLET | Freq: Once | SUBLINGUAL | Status: AC
  Administered 2022-12-14: 0.8 mg via SUBLINGUAL
  Filled 2022-12-14: qty 25

## 2022-12-14 MED ORDER — METOPROLOL TARTRATE 5 MG/5ML IV SOLN
10.0000 mg | Freq: Once | INTRAVENOUS | Status: AC
  Administered 2022-12-14: 5 mg via INTRAVENOUS
  Filled 2022-12-14: qty 10

## 2022-12-14 MED ORDER — IOHEXOL 350 MG/ML SOLN
100.0000 mL | Freq: Once | INTRAVENOUS | Status: AC | PRN
  Administered 2022-12-14: 100 mL via INTRAVENOUS

## 2022-12-14 NOTE — Progress Notes (Signed)
Pt tolerated procedure well. Pt ABCs intact. Pt denies any complaints. Pt encouraged to drink plenty of water throughout the day. Pt ambulatory with steady gait.

## 2022-12-19 DIAGNOSIS — E109 Type 1 diabetes mellitus without complications: Secondary | ICD-10-CM | POA: Diagnosis not present

## 2022-12-26 ENCOUNTER — Ambulatory Visit: Admission: RE | Admit: 2022-12-26 | Payer: BC Managed Care – PPO | Source: Ambulatory Visit

## 2022-12-29 ENCOUNTER — Other Ambulatory Visit: Payer: Self-pay

## 2022-12-30 ENCOUNTER — Other Ambulatory Visit: Payer: Self-pay | Admitting: Family Medicine

## 2022-12-30 DIAGNOSIS — E118 Type 2 diabetes mellitus with unspecified complications: Secondary | ICD-10-CM

## 2023-01-05 ENCOUNTER — Encounter: Payer: BC Managed Care – PPO | Admitting: Family Medicine

## 2023-01-17 DIAGNOSIS — T162XXA Foreign body in left ear, initial encounter: Secondary | ICD-10-CM | POA: Diagnosis not present

## 2023-01-19 DIAGNOSIS — E109 Type 1 diabetes mellitus without complications: Secondary | ICD-10-CM | POA: Diagnosis not present

## 2023-01-22 ENCOUNTER — Other Ambulatory Visit: Payer: Self-pay

## 2023-02-19 DIAGNOSIS — E109 Type 1 diabetes mellitus without complications: Secondary | ICD-10-CM | POA: Diagnosis not present

## 2023-02-20 ENCOUNTER — Other Ambulatory Visit: Payer: Self-pay

## 2023-02-22 DIAGNOSIS — R0602 Shortness of breath: Secondary | ICD-10-CM | POA: Diagnosis not present

## 2023-02-22 DIAGNOSIS — F419 Anxiety disorder, unspecified: Secondary | ICD-10-CM | POA: Diagnosis not present

## 2023-02-22 DIAGNOSIS — E119 Type 2 diabetes mellitus without complications: Secondary | ICD-10-CM | POA: Diagnosis not present

## 2023-02-22 DIAGNOSIS — I2089 Other forms of angina pectoris: Secondary | ICD-10-CM | POA: Diagnosis not present

## 2023-03-03 DIAGNOSIS — Z0000 Encounter for general adult medical examination without abnormal findings: Secondary | ICD-10-CM | POA: Diagnosis not present

## 2023-03-03 DIAGNOSIS — E118 Type 2 diabetes mellitus with unspecified complications: Secondary | ICD-10-CM | POA: Diagnosis not present

## 2023-03-03 DIAGNOSIS — I10 Essential (primary) hypertension: Secondary | ICD-10-CM | POA: Diagnosis not present

## 2023-03-21 DIAGNOSIS — E109 Type 1 diabetes mellitus without complications: Secondary | ICD-10-CM | POA: Diagnosis not present

## 2023-04-17 ENCOUNTER — Other Ambulatory Visit: Payer: Self-pay

## 2023-04-17 MED ORDER — TRULICITY 1.5 MG/0.5ML ~~LOC~~ SOAJ
1.5000 mg | SUBCUTANEOUS | 6 refills | Status: DC
  Filled 2023-04-17: qty 2, 28d supply, fill #0

## 2023-04-21 DIAGNOSIS — E109 Type 1 diabetes mellitus without complications: Secondary | ICD-10-CM | POA: Diagnosis not present

## 2023-06-12 DIAGNOSIS — E113393 Type 2 diabetes mellitus with moderate nonproliferative diabetic retinopathy without macular edema, bilateral: Secondary | ICD-10-CM | POA: Diagnosis not present

## 2023-06-16 ENCOUNTER — Other Ambulatory Visit: Payer: Self-pay

## 2023-07-17 ENCOUNTER — Other Ambulatory Visit: Payer: Self-pay

## 2023-07-17 MED ORDER — TRULICITY 3 MG/0.5ML ~~LOC~~ SOAJ
3.0000 mg | SUBCUTANEOUS | 3 refills | Status: AC
  Filled 2023-07-17: qty 6, 84d supply, fill #0
  Filled 2023-10-12: qty 6, 84d supply, fill #1

## 2023-07-19 ENCOUNTER — Other Ambulatory Visit: Payer: Self-pay

## 2023-07-24 ENCOUNTER — Other Ambulatory Visit: Payer: Self-pay

## 2023-10-24 ENCOUNTER — Ambulatory Visit (INDEPENDENT_AMBULATORY_CARE_PROVIDER_SITE_OTHER): Payer: Self-pay | Admitting: Oncology

## 2023-10-24 ENCOUNTER — Encounter: Payer: Self-pay | Admitting: Oncology

## 2023-10-24 ENCOUNTER — Other Ambulatory Visit: Payer: Self-pay

## 2023-10-24 VITALS — BP 133/103 | HR 95 | Temp 98.6°F | Ht 72.0 in | Wt 275.0 lb

## 2023-10-24 DIAGNOSIS — J0140 Acute pansinusitis, unspecified: Secondary | ICD-10-CM

## 2023-10-24 MED ORDER — METHYLPREDNISOLONE 4 MG PO TBPK: ORAL_TABLET | ORAL | 0 refills | Status: DC

## 2023-10-24 MED ORDER — DOXYCYCLINE HYCLATE 100 MG PO TABS
100.0000 mg | ORAL_TABLET | Freq: Two times a day (BID) | ORAL | 0 refills | Status: DC
Start: 2023-10-24 — End: 2024-03-13

## 2023-10-24 NOTE — Progress Notes (Signed)
 [facility]  [address] [place], [place] [address] Phone: [phone] Fax: [phone]   Office Visit Note  Patient Name: [name] [name].  Date of Birth:1975-12-24  Med Rec number [id number]  Date of Service: 10/24/2023  Penicillin g, Penicillins, Shellfish allergy, and Yellow jacket venom  Chief Complaint  Patient presents with   Acute Visit    Patient c/o nasal congestion, headache, and congestion in his chest with occasional wheezing. Denies SOB. Symptoms began about 8 days ago. He takes cetirizine daily and has taken Benadryl recently. He has also been using nasal saline.    HPI Patient is an 48 y.o. male who is here for a sick visit.  Reports nasal congestion, headache, sore throat and chest congestion that started about a week ago.  Reports he was Bush hogging for about 3 hours last Monday and reports symptoms started later that evening.  Reports he did wear a mask.  This has happened in the past and he typically just takes Benadryl with symptom relief the next morning.  This has been fairly persistent and he feels it is now moving to his chest.  Denies any fevers but has not checked his temperature.  He has been using nasal saline, Zyrtec and Benadryl.  He has a severe allergy to penicillins (anaphylaxis), yellow jackets and shellfish.  Denies any recent antibiotics.  No sick contacts.   Current Medication:  Outpatient Encounter Medications as of 10/24/2023  Medication Sig   aspirin  81 MG chewable tablet Chew 81 mg by mouth daily.   cetirizine (ZYRTEC) 10 MG tablet Take 10 mg by mouth daily.   cholecalciferol (VITAMIN D3) 25 MCG (1000 UNIT) tablet Take 1,000 Units by mouth daily.   CINNAMON PO Take 1 capsule by mouth daily.   diazepam  (VALIUM ) 5 MG tablet Take 1 tablet (5 mg total) by mouth every 12 (twelve) hours as needed for anxiety.   doxycycline (VIBRA-TABS) 100 MG tablet Take 1 tablet (100 mg total) by mouth 2 (two) times daily.   Dulaglutide  (TRULICITY )  1.5 MG/0.5ML SOPN Inject 1.5 mg into the skin once a week.   EPINEPHrine  0.3 mg/0.3 mL IJ SOAJ injection Inject 0.3 mg into the muscle as needed.   metFORMIN  (GLUCOPHAGE ) 1000 MG tablet TAKE 1 TABLET BY MOUTH 2 (TWO) TIMES DAILY WITH A MEAL. TAKE DAILY WITH BREAKFAST AND AT DINNER   methylPREDNISolone  (MEDROL  DOSEPAK) 4 MG TBPK tablet Take as directed.   Multiple Vitamin (MULTIVITAMIN PO) Take by mouth.   omeprazole (PRILOSEC) 20 MG capsule Take 20 mg by mouth daily.   rosuvastatin (CRESTOR) 20 MG tablet Take 20 mg by mouth daily.   sertraline  (ZOLOFT ) 100 MG tablet TAKE 1 TABLET BY MOUTH EVERY DAY   tadalafil (CIALIS) 10 MG tablet Take 10 mg by mouth daily as needed.   Dulaglutide  (TRULICITY ) 3 MG/0.5ML SOAJ Inject 0.5 mLs (3 mg total) subcutaneously every 7 (seven) days (Patient not taking: Reported on 10/24/2023)   [DISCONTINUED] Dulaglutide  (TRULICITY ) 1.5 MG/0.5ML SOAJ Inject 0.5 mL (1.5 mg total) subcutaneously into the skin once a week.   [DISCONTINUED] Dulaglutide  (TRULICITY ) 1.5 MG/0.5ML SOPN Inject 1.5 mg into the skin once a week.   [DISCONTINUED] metoprolol  tartrate (LOPRESSOR ) 100 MG tablet Take tablet (100mg ) TWO hours prior to your cardiac CT scan.   [DISCONTINUED] Sodium Sulfate-Mag Sulfate-KCl (SUTAB ) [phone] MG TABS At 5 PM take 12 tablets using the 8 oz cup provided in the kit drinking 5 cups of water and 5 hours before  your procedure repeat the same process.   No facility-administered encounter medications on file as of 10/24/2023.   Medical History: Past Medical History:  Diagnosis Date   Depressed    Depression    GERD (gastroesophageal reflux disease)    Headache(784.0)    Hypertension    Sleep apnea    cpap   Vital Signs: BP (!) 133/103 Comment: "just finished drinking coffee"  Pulse 95   Temp 98.6 F (37 C)   Ht 6' (1.829 m)   Wt 275 lb (124.7 kg)   SpO2 98%   BMI 37.30 kg/m   ROS: As per HPI.  All other pertinent ROS negative.     Review of Systems   Constitutional:  Positive for diaphoresis and fatigue. Negative for chills and fever.  HENT:  Positive for congestion, postnasal drip, sinus pressure, sinus pain and sore throat. Negative for ear pain.   Respiratory:  Positive for cough. Negative for shortness of breath and wheezing.   Cardiovascular:  Negative for chest pain and leg swelling.  Gastrointestinal:  Negative for blood in stool, diarrhea, nausea and vomiting.  Allergic/Immunologic: Positive for environmental allergies.  Neurological:  Positive for headaches. Negative for dizziness.    Physical Exam Constitutional:      Appearance: Normal appearance. He is obese.  HENT:     Right Ear: A middle ear effusion is present.     Left Ear: A middle ear effusion is present.     Nose: Mucosal edema, congestion and rhinorrhea present. Rhinorrhea is clear.     Right Turbinates: Pale. Not swollen.     Left Turbinates: Pale. Not swollen.     Right Sinus: Maxillary sinus tenderness and frontal sinus tenderness present.     Left Sinus: Maxillary sinus tenderness and frontal sinus tenderness present.     Mouth/Throat:     Pharynx: Postnasal drip present.     Tonsils: No tonsillar exudate. 0 on the right. 0 on the left.  Cardiovascular:     Rate and Rhythm: Normal rate.  Pulmonary:     Effort: Prolonged expiration present.     Breath sounds: Normal breath sounds. No decreased air movement. No decreased breath sounds or wheezing.  Lymphadenopathy:     Cervical: No cervical adenopathy.  Neurological:     Mental Status: He is alert.     No results found for this or any previous visit (from the past 24 hours).  Assessment/Plan: 1. Acute non-recurrent pansinusitis (Primary) Exam is consistent with pansinusitis.  We discussed starting doxycycline twice daily x 7 days along with Medrol  Dosepak to help with inflammation and delayed expiration.  He has tolerated steroids well in the past.  Recommend he start Flonase 2 sprays each nostril  once or twice a day and continue Zyrtec and nasal spray as needed.  Please let me know if your symptoms are not improving by early next week.  We discussed taking both medications with food and to avoid NSAIDs with steroids.  Make sure you take the steroids in the morning to avoid sleep disruption.  - doxycycline (VIBRA-TABS) 100 MG tablet; Take 1 tablet (100 mg total) by mouth 2 (two) times daily.  Dispense: 14 tablet; Refill: 0 - methylPREDNISolone  (MEDROL  DOSEPAK) 4 MG TBPK tablet; Take as directed.  Dispense: 21 tablet; Refill: 0   General Counseling: [name] [name] verbalizes understanding of the findings of todays visit and agrees with plan of treatment. I have discussed any further diagnostic evaluation that may be needed or ordered today.  We also reviewed his medications today. he has been encouraged to call the office with any questions or concerns that should arise related to todays visit.   No orders of the defined types were placed in this encounter.   Meds ordered this encounter  Medications   doxycycline (VIBRA-TABS) 100 MG tablet    Sig: Take 1 tablet (100 mg total) by mouth 2 (two) times daily.    Dispense:  14 tablet    Refill:  0   methylPREDNISolone  (MEDROL  DOSEPAK) 4 MG TBPK tablet    Sig: Take as directed.    Dispense:  21 tablet    Refill:  0    I spent 20 minutes dedicated to the care of this patient (face-to-face and non-face-to-face) on the date of the encounter to include what is described in the assessment and plan.   Charlton Cooler, NP 10/24/2023 10:58 AM

## 2024-01-05 ENCOUNTER — Other Ambulatory Visit: Payer: Self-pay

## 2024-01-05 MED ORDER — TRULICITY 3 MG/0.5ML ~~LOC~~ SOAJ
3.0000 mg | SUBCUTANEOUS | 3 refills | Status: DC
  Filled 2024-01-05: qty 6, 84d supply, fill #0

## 2024-03-05 NOTE — H&P (View-Only) (Signed)
 History of Present Illness [name] [name] is a 48 year old male who presents with recurrent thrombosed hemorrhoids.  He has been experiencing hemorrhoids for about a year, with significant flare-ups over the past three to four months. The hemorrhoids recur in the same location, sometimes reaching the size of a golf ball, and cause significant pain, leading to missed work days. The most recent flare-up began approximately two weeks ago, with pain subsiding over the last two days.  Pain is localized to the rectal area.  No radiation.  Pain aggravated by bowel movement.  There has been no alleviating factors.  He has not undergone any surgical interventions such as incision, drainage, or excision for the hemorrhoids. Various treatments, including over-the-counter creams like Preparation H and suppositories, have provided limited relief. Some treatments have caused irritation and itching. He also experiences occasional bleeding during bowel movements.  He has a history of constipation, which he manages with a prescribed stool softener taken regularly. He believes his diabetic medication, metformin , contributes to his constipation. Lifestyle changes include using a bidet to reduce irritation from wiping.  No frequent diarrhea, but occasional constipation and bleeding with bowel movements.      PAST MEDICAL HISTORY:  Past Medical History:  Diagnosis Date  . Depression         PAST SURGICAL HISTORY:   Past Surgical History:  Procedure Laterality Date  . BACK SURGERY           MEDICATIONS:  Outpatient Encounter Medications as of 03/05/2024  Medication Sig Dispense Refill  . aspirin  81 MG chewable tablet Take 81 mg by mouth once daily    . cetirizine (ZYRTEC) 10 MG tablet Take 10 mg by mouth once daily    . cholecalciferol (VITAMIN D3) 1000 unit tablet Take 2,000 Units by mouth once daily    . cinnamon bark (CINNAMON ORAL) Take 1 capsule by mouth once daily    . diazePAM  (VALIUM ) 5 MG tablet  TAKE 1 TABLET (5 MG TOTAL) BY MOUTH EVERY 12 (TWELVE) HOURS AS NEEDED FOR ANXIETY.    . dulaglutide  (TRULICITY ) 3 mg/0.5 mL subcutaneous pen injector Inject 0.5 mLs (3 mg total) subcutaneously every 7 (seven) days 6 mL 3  . EPINEPHrine  (EPIPEN ) 0.3 mg/0.3 mL auto-injector Inject 0.3 mg into the muscle once as needed    . metFORMIN  (GLUCOPHAGE ) 1000 MG tablet Take 1 tablet (1,000 mg total) by mouth 2 (two) times daily with meals (Patient taking differently: Take 1,000 mg by mouth once daily Pt is taking its once a day) 180 tablet 3  . multivitamin tablet Take 1 tablet by mouth once daily    . omeprazole (PRILOSEC) 20 MG DR capsule TAKE 1 CAPSULE BY MOUTH EVERY DAY 90 capsule 3  . rosuvastatin (CRESTOR) 20 MG tablet TAKE 1 TABLET BY MOUTH EVERY DAY 90 tablet 3  . sertraline  (ZOLOFT ) 100 MG tablet Take 1 tablet (100 mg total) by mouth once daily 90 tablet 1  . tadalafiL (CIALIS) 10 MG tablet Take 1 tablet (10 mg total) by mouth once daily as needed for Erectile Dysfunction for up to 30 days Take dose 30-45 min prior to anticipated sexual activity. 30 tablet 5   No facility-administered encounter medications on file as of 03/05/2024.     ALLERGIES:   Penicillin, Shellfish containing products, Yellow jacket venom, and Other   SOCIAL HISTORY:  Social History   Socioeconomic History  . Marital status: Married  Tobacco Use  . Smoking status: Never  . Smokeless tobacco:  Never  Substance and Sexual Activity  . Alcohol use: Not Currently  . Drug use: Never  Social History Narrative   ** Merged History Encounter **       Social Drivers of Health   Financial Resource Strain: Low Risk  (07/04/2023)   Overall Financial Resource Strain (CARDIA)   . Difficulty of Paying Living Expenses: Not hard at all  Food Insecurity: No Food Insecurity (07/04/2023)   Hunger Vital Sign   . Worried About Programme researcher, broadcasting/film/video in the Last Year: Never true   . Ran Out of Food in the Last Year: Never true   Transportation Needs: No Transportation Needs (07/04/2023)   PRAPARE - Transportation   . Lack of Transportation (Medical): No   . Lack of Transportation (Non-Medical): No    FAMILY HISTORY:  Family History  Problem Relation Name Age of Onset  . Diabetes type II Mother    . Heart disease Maternal Grandfather    . Atrial fibrillation (Abnormal heart rhythm sometimes requiring treatment with blood thinners) Maternal Grandfather    . Atrial fibrillation (Abnormal heart rhythm sometimes requiring treatment with blood thinners) Maternal Grandmother       GENERAL REVIEW OF SYSTEMS:   General ROS: negative for - chills, fatigue, fever, weight gain or weight loss Allergy and Immunology ROS: negative for - hives  Hematological and Lymphatic ROS: negative for - bleeding problems or bruising, negative for palpable nodes Endocrine ROS: negative for - heat or cold intolerance, hair changes Respiratory ROS: negative for - cough, shortness of breath or wheezing Cardiovascular ROS: no chest pain or palpitations GI ROS: negative for nausea, vomiting, abdominal pain, diarrhea, constipation Musculoskeletal ROS: negative for - joint swelling or muscle pain Neurological ROS: negative for - confusion, syncope Dermatological ROS: negative for pruritus and rash  PHYSICAL EXAM:  Vitals:   03/05/24 1049  BP: 127/83  Pulse: 92  .  Ht:182.9 cm (6') Wt:(!) 122.5 kg (270 lb) ADJ:Anib surface area is 2.49 meters squared. Body mass index is 36.62 kg/m.SABRA   GENERAL: Alert, active, oriented x3  HEENT: Pupils equal reactive to light. Extraocular movements are intact. Sclera clear. Palpebral conjunctiva normal red color.Pharynx clear.  NECK: Supple with no palpable mass and no adenopathy.  LUNGS: Sound clear with no rales rhonchi or wheezes.  HEART: Regular rhythm S1 and S2 without murmur.  RECTAL: There is a large thrombosed hemorrhoids on the left lateral column.  No sign of infection, no bleeding.   There is tenderness to palpation.  Unable to do a full digital rectal exam due to pain.  EXTREMITIES: Well-developed well-nourished symmetrical with no dependent edema.  NEUROLOGICAL: Awake alert oriented, facial expression symmetrical, moving all extremities.     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 The current episode began two weeks ago, with significant improvement in the last two days. The hemorrhoid is external and can reach the size of a golf ball during flare-ups, causing pain and occasional bleeding. Constipation contributes to the condition. Over-the-counter creams and suppositories have provided limited relief. Surgical intervention is discussed as a potential option due to the recurrent nature and impact on quality of life. Surgery risks include significant postoperative pain lasting 2-4 weeks, with no guarantee of preventing future issues due to multiple hemorrhoids. The decision for surgery is based on symptom frequency and severity affecting quality of life. Discuss surgical excision  with him and his family. Advise dietary modifications to increase fiber and water intake to manage constipation. Recommend avoiding prolonged sitting on the toilet and using a bidet to reduce irritation. Provide a summary of the discussion and allow time to consider surgical options.   Thrombosed external hemorrhoid [K64.5]          Patient verbalized understanding, all questions were answered, and were agreeable with the plan outlined above.   Lucas Sjogren, MD  Electronically signed by Lucas Sjogren, MD

## 2024-03-12 ENCOUNTER — Ambulatory Visit: Payer: Self-pay | Admitting: General Surgery

## 2024-03-13 ENCOUNTER — Encounter
Admission: RE | Admit: 2024-03-13 | Discharge: 2024-03-13 | Disposition: A | Discharge status: Home / Self Care | Source: Ambulatory Visit | Attending: General Surgery | Admitting: General Surgery

## 2024-03-13 ENCOUNTER — Other Ambulatory Visit: Payer: Self-pay

## 2024-03-13 DIAGNOSIS — I10 Essential (primary) hypertension: Secondary | ICD-10-CM

## 2024-03-13 DIAGNOSIS — Z01812 Encounter for preprocedural laboratory examination: Secondary | ICD-10-CM

## 2024-03-13 DIAGNOSIS — E119 Type 2 diabetes mellitus without complications: Secondary | ICD-10-CM

## 2024-03-13 HISTORY — DX: Pneumonia, unspecified organism: J18.9

## 2024-03-13 HISTORY — DX: Anxiety disorder, unspecified: F41.9

## 2024-03-13 HISTORY — DX: Type 2 diabetes mellitus without complications: E11.9

## 2024-03-13 HISTORY — DX: Unspecified osteoarthritis, unspecified site: M19.90

## 2024-03-13 HISTORY — DX: Dyspnea, unspecified: R06.00

## 2024-03-13 HISTORY — DX: Angina pectoris, unspecified: I20.9

## 2024-03-13 NOTE — Patient Instructions (Addendum)
 Your procedure is scheduled on: 03/18/24 - Monday Report to the Registration Desk on the 1st floor of the [facility]. To find out your arrival time, please call [phone] between 1PM - 3PM on: 03/15/24 - Friday If your arrival time is 6:00 am, do not arrive before that time as the [facility] entrance doors do not open until 6:00 am.  REMEMBER: Instructions that are not followed completely may result in serious medical risk, up to and including death; or upon the discretion of your surgeon and anesthesiologist your surgery may need to be rescheduled.  Do not eat food or drink any liquids after midnight the night before surgery.  No gum chewing or hard candies.  One week prior to surgery: Stop Anti-inflammatories (NSAIDS) such as Advil, Aleve, Ibuprofen, Motrin, Naproxen, Naprosyn and Aspirin  based products such as Excedrin, Goody's Powder, BC Powder. You may take Tylenol  if needed for pain up until the day of surgery.   Stop ANY OVER THE COUNTER supplements until after surgery : VITAMIN D3 ,CINNAMON, MULTIVITAMIN .  Aspirin  81 mg - Hold for 5 days prior to your surgery.  tadalafil (CIALIS) - hold for 2 days prior to your surgery  metFORMIN  (GLUCOPHAGE ) - hold 2 days prior to your surgery.  Dulaglutide  (TRULICITY )  - hold for 7 days prior to your surgery.   ON THE DAY OF SURGERY ONLY TAKE THESE MEDICATIONS WITH SIPS OF WATER:  omeprazole (PRILOSEC)  sertraline  (ZOLOFT )  diazepam  (VALIUM ) if needed    No Alcohol for 24 hours before or after surgery.  No Smoking including e-cigarettes for 24 hours before surgery.  No chewable tobacco products for at least 6 hours before surgery.  No nicotine patches on the day of surgery.  Do not use any recreational drugs for at least a week (preferably 2 weeks) before your surgery.  Please be advised that the combination of cocaine and anesthesia may have negative outcomes, up to and including death. If you test positive for  cocaine, your surgery will be cancelled.  On the morning of surgery brush your teeth with toothpaste and water, you may rinse your mouth with mouthwash if you wish. Do not swallow any toothpaste or mouthwash.  Do not wear jewelry, make-up, hairpins, clips or nail polish.  For welded (permanent) jewelry: bracelets, anklets, waist bands, etc.  Please have this removed prior to surgery.  If it is not removed, there is a chance that hospital personnel will need to cut it off on the day of surgery.  Do not wear lotions, powders, or perfumes.   Do not shave body hair from the neck down 48 hours before surgery.  Contact lenses, hearing aids and dentures may not be worn into surgery.  Do not bring valuables to the hospital. [facility] is not responsible for any missing/lost belongings or valuables.   Bring your C-PAP to the hospital in case you may have to spend the night.   Notify your doctor if there is any change in your medical condition (cold, fever, infection).  Wear comfortable clothing (specific to your surgery type) to the hospital.  After surgery, you can help prevent lung complications by doing breathing exercises.  Take deep breaths and cough every 1-2 hours. Your doctor may order a device called an Incentive Spirometer to help you take deep breaths.  When coughing or sneezing, hold a pillow firmly against your incision with both hands. This is called "splinting." Doing this helps protect your incision. It also decreases belly discomfort.  If you are being admitted to the hospital overnight, leave your suitcase in the car. After surgery it may be brought to your room.  In case of increased patient census, it may be necessary for you, the patient, to continue your postoperative care in the Same Day Surgery department.  If you are being discharged the day of surgery, you will not be allowed to drive home. You will need a responsible individual to drive you home and stay with you  for 24 hours after surgery.   If you are taking public transportation, you will need to have a responsible individual with you.  Please call the Pre-admissions Testing Dept. at [phone] if you have any questions about these instructions.  Surgery Visitation Policy:  Patients having surgery or a procedure may have two visitors.  Children under the age of 49 must have an adult with them who is not the patient.  Inpatient Visitation:    Visiting hours are 7 a.m. to 8 p.m. Up to four visitors are allowed at one time in a patient room. The visitors may rotate out with other people during the day.  One visitor age 76 or older may stay with the patient overnight and must be in the room by 8 p.m.   Merchandiser, retail to address health-related social needs:  [email or web address]

## 2024-03-14 ENCOUNTER — Encounter
Admission: RE | Admit: 2024-03-14 | Discharge: 2024-03-14 | Disposition: A | Discharge status: Home / Self Care | Source: Ambulatory Visit | Attending: General Surgery | Admitting: General Surgery

## 2024-03-14 DIAGNOSIS — Z01818 Encounter for other preprocedural examination: Secondary | ICD-10-CM | POA: Insufficient documentation

## 2024-03-14 DIAGNOSIS — I10 Essential (primary) hypertension: Secondary | ICD-10-CM | POA: Insufficient documentation

## 2024-03-14 DIAGNOSIS — K649 Unspecified hemorrhoids: Secondary | ICD-10-CM

## 2024-03-14 DIAGNOSIS — Z01810 Encounter for preprocedural cardiovascular examination: Secondary | ICD-10-CM

## 2024-03-14 DIAGNOSIS — Z01812 Encounter for preprocedural laboratory examination: Secondary | ICD-10-CM

## 2024-03-14 LAB — BASIC METABOLIC PANEL WITH GFR
Anion gap: 10 (ref 5–15)
BUN: 18 mg/dL (ref 6–20)
CO2: 28 mmol/L (ref 22–32)
Calcium: 9.2 mg/dL (ref 8.9–10.3)
Chloride: 99 mmol/L (ref 98–111)
Creatinine, Ser: 0.99 mg/dL (ref 0.61–1.24)
GFR, Estimated: >60 mL/min (ref >60)
Glucose, Bld: 124 mg/dL — ABNORMAL HIGH (ref 70–99)
Potassium: 4 mmol/L (ref 3.5–5.1)
Sodium: 137 mmol/L (ref 135–145)

## 2024-03-14 LAB — CBC
HCT: 42.2 % (ref 39.0–52.0)
Hemoglobin: 14.8 g/dL (ref 13.0–17.0)
MCH: 31.4 pg (ref 26.0–34.0)
MCHC: 35.1 g/dL (ref 30.0–36.0)
MCV: 89.6 fL (ref 80.0–100.0)
Platelets: 264 K/uL (ref 150–400)
RBC: 4.71 MIL/uL (ref 4.22–5.81)
RDW: 12.8 % (ref 11.5–15.5)
WBC: 9 K/uL (ref 4.0–10.5)
nRBC: 0 % (ref 0.0–0.2)

## 2024-03-17 MED ORDER — CIPROFLOXACIN IN D5W 400 MG/200ML IV SOLN
400.0000 mg | INTRAVENOUS | Status: AC
  Administered 2024-03-18: 400 mg via INTRAVENOUS

## 2024-03-17 MED ORDER — ORAL CARE MOUTH RINSE: 15.0000 mL | Freq: Once | OROMUCOSAL | Status: AC

## 2024-03-17 MED ORDER — CHLORHEXIDINE GLUCONATE 0.12 % MT SOLN
15.0000 mL | Freq: Once | OROMUCOSAL | Status: AC
  Administered 2024-03-18: 15 mL via OROMUCOSAL

## 2024-03-17 MED ORDER — METRONIDAZOLE 500 MG/100ML IV SOLN
500.0000 mg | INTRAVENOUS | Status: AC
  Administered 2024-03-18: 500 mg via INTRAVENOUS
  Filled 2024-03-17: qty 100

## 2024-03-17 MED ORDER — SODIUM CHLORIDE 0.9 % IV SOLN: INTRAVENOUS | Status: DC

## 2024-03-18 ENCOUNTER — Other Ambulatory Visit: Payer: Self-pay

## 2024-03-18 ENCOUNTER — Ambulatory Visit
Admission: RE | Admit: 2024-03-18 | Discharge: 2024-03-18 | Disposition: A | Discharge status: Home / Self Care | Attending: General Surgery | Admitting: General Surgery

## 2024-03-18 ENCOUNTER — Ambulatory Visit: Payer: Self-pay | Admitting: Urgent Care

## 2024-03-18 ENCOUNTER — Encounter: Payer: Self-pay | Admitting: General Surgery

## 2024-03-18 ENCOUNTER — Encounter
Admission: RE | Disposition: A | Discharge status: Home / Self Care | Payer: Self-pay | Source: Home / Self Care | Attending: General Surgery

## 2024-03-18 ENCOUNTER — Ambulatory Visit: Payer: Self-pay | Admitting: Certified Registered"

## 2024-03-18 DIAGNOSIS — K219 Gastro-esophageal reflux disease without esophagitis: Secondary | ICD-10-CM | POA: Diagnosis not present

## 2024-03-18 DIAGNOSIS — Z6837 Body mass index (BMI) 37.0-37.9, adult: Secondary | ICD-10-CM | POA: Insufficient documentation

## 2024-03-18 DIAGNOSIS — G4730 Sleep apnea, unspecified: Secondary | ICD-10-CM | POA: Insufficient documentation

## 2024-03-18 DIAGNOSIS — Z7984 Long term (current) use of oral hypoglycemic drugs: Secondary | ICD-10-CM | POA: Diagnosis not present

## 2024-03-18 DIAGNOSIS — E119 Type 2 diabetes mellitus without complications: Secondary | ICD-10-CM | POA: Insufficient documentation

## 2024-03-18 DIAGNOSIS — K644 Residual hemorrhoidal skin tags: Secondary | ICD-10-CM | POA: Insufficient documentation

## 2024-03-18 DIAGNOSIS — Z79899 Other long term (current) drug therapy: Secondary | ICD-10-CM | POA: Insufficient documentation

## 2024-03-18 DIAGNOSIS — K5900 Constipation, unspecified: Secondary | ICD-10-CM | POA: Insufficient documentation

## 2024-03-18 DIAGNOSIS — Z01812 Encounter for preprocedural laboratory examination: Secondary | ICD-10-CM

## 2024-03-18 DIAGNOSIS — K645 Perianal venous thrombosis: Secondary | ICD-10-CM | POA: Insufficient documentation

## 2024-03-18 DIAGNOSIS — E66813 Obesity, class 3: Secondary | ICD-10-CM | POA: Insufficient documentation

## 2024-03-18 DIAGNOSIS — Z833 Family history of diabetes mellitus: Secondary | ICD-10-CM | POA: Diagnosis not present

## 2024-03-18 DIAGNOSIS — Z7985 Long-term (current) use of injectable non-insulin antidiabetic drugs: Secondary | ICD-10-CM | POA: Diagnosis not present

## 2024-03-18 DIAGNOSIS — F418 Other specified anxiety disorders: Secondary | ICD-10-CM | POA: Insufficient documentation

## 2024-03-18 DIAGNOSIS — Z87891 Personal history of nicotine dependence: Secondary | ICD-10-CM | POA: Diagnosis not present

## 2024-03-18 DIAGNOSIS — I10 Essential (primary) hypertension: Secondary | ICD-10-CM | POA: Insufficient documentation

## 2024-03-18 DIAGNOSIS — Z8249 Family history of ischemic heart disease and other diseases of the circulatory system: Secondary | ICD-10-CM | POA: Insufficient documentation

## 2024-03-18 HISTORY — PX: HEMORRHOID SURGERY: SHX153

## 2024-03-18 LAB — GLUCOSE, CAPILLARY
Glucose-Capillary: 139 mg/dL — ABNORMAL HIGH (ref 70–99)
Glucose-Capillary: 125 mg/dL — ABNORMAL HIGH (ref 70–99)

## 2024-03-18 SURGERY — HEMORRHOIDECTOMY
Anesthesia: General | Site: Rectum

## 2024-03-18 MED ORDER — FENTANYL CITRATE (PF) 100 MCG/2ML IJ SOLN: 25.0000 ug | INTRAMUSCULAR | Status: DC | PRN

## 2024-03-18 MED ORDER — GELATIN ABSORBABLE 100 EX MISC
CUTANEOUS | Status: DC | PRN
  Administered 2024-03-18: 1

## 2024-03-18 MED ORDER — DROPERIDOL 2.5 MG/ML IJ SOLN: 0.6250 mg | Freq: Once | INTRAMUSCULAR | Status: DC | PRN

## 2024-03-18 MED ORDER — DEXMEDETOMIDINE HCL IN NACL 200 MCG/50ML IV SOLN
INTRAVENOUS | Status: DC | PRN
  Administered 2024-03-18: 8 ug via INTRAVENOUS
  Administered 2024-03-18: 4 ug via INTRAVENOUS
  Administered 2024-03-18: 8 ug via INTRAVENOUS

## 2024-03-18 MED ORDER — CHLORHEXIDINE GLUCONATE 0.12 % MT SOLN
OROMUCOSAL | Status: AC
  Filled 2024-03-18: qty 15

## 2024-03-18 MED ORDER — OXYCODONE HCL 5 MG/5ML PO SOLN: 5.0000 mg | Freq: Once | ORAL | Status: DC | PRN

## 2024-03-18 MED ORDER — MIDAZOLAM HCL 2 MG/2ML IJ SOLN
INTRAMUSCULAR | Status: DC | PRN
  Administered 2024-03-18: 2 mg via INTRAVENOUS

## 2024-03-18 MED ORDER — FENTANYL CITRATE (PF) 100 MCG/2ML IJ SOLN
INTRAMUSCULAR | Status: DC | PRN
  Administered 2024-03-18 (×2): 50 ug via INTRAVENOUS

## 2024-03-18 MED ORDER — LIDOCAINE HCL (CARDIAC) PF 100 MG/5ML IV SOSY
PREFILLED_SYRINGE | INTRAVENOUS | Status: DC | PRN
Start: 2024-03-18 — End: 2024-03-18
  Administered 2024-03-18: 100 mg via INTRAVENOUS

## 2024-03-18 MED ORDER — ACETAMINOPHEN 10 MG/ML IV SOLN
INTRAVENOUS | Status: AC
  Filled 2024-03-18: qty 100

## 2024-03-18 MED ORDER — ONDANSETRON HCL 4 MG/2ML IJ SOLN
INTRAMUSCULAR | Status: DC | PRN
  Administered 2024-03-18 (×2): 4 mg via INTRAVENOUS

## 2024-03-18 MED ORDER — OXYCODONE HCL 5 MG PO TABS: 5.0000 mg | ORAL_TABLET | Freq: Once | ORAL | Status: DC | PRN

## 2024-03-18 MED ORDER — BUPIVACAINE LIPOSOME 1.3 % IJ SUSP
INTRAMUSCULAR | Status: AC
  Filled 2024-03-18: qty 20

## 2024-03-18 MED ORDER — SUGAMMADEX SODIUM 200 MG/2ML IV SOLN
INTRAVENOUS | Status: DC | PRN
  Administered 2024-03-18: 300 mg via INTRAVENOUS

## 2024-03-18 MED ORDER — ROCURONIUM BROMIDE 100 MG/10ML IV SOLN
INTRAVENOUS | Status: DC | PRN
Start: 2024-03-18 — End: 2024-03-18
  Administered 2024-03-18: 40 mg via INTRAVENOUS
  Administered 2024-03-18: 10 mg via INTRAVENOUS

## 2024-03-18 MED ORDER — 0.9 % SODIUM CHLORIDE (POUR BTL) OPTIME
TOPICAL | Status: DC | PRN
  Administered 2024-03-18: 500 mL

## 2024-03-18 MED ORDER — BUPIVACAINE-EPINEPHRINE (PF) 0.5% -1:200000 IJ SOLN
INTRAMUSCULAR | Status: AC
  Filled 2024-03-18: qty 30

## 2024-03-18 MED ORDER — ACETAMINOPHEN 10 MG/ML IV SOLN: 1000.0000 mg | Freq: Once | INTRAVENOUS | Status: DC | PRN

## 2024-03-18 MED ORDER — ACETAMINOPHEN 10 MG/ML IV SOLN
INTRAVENOUS | Status: DC | PRN
  Administered 2024-03-18: 1000 mg via INTRAVENOUS

## 2024-03-18 MED ORDER — CIPROFLOXACIN IN D5W 400 MG/200ML IV SOLN
INTRAVENOUS | Status: AC
  Filled 2024-03-18: qty 200

## 2024-03-18 MED ORDER — SUCCINYLCHOLINE CHLORIDE 200 MG/10ML IV SOSY
PREFILLED_SYRINGE | INTRAVENOUS | Status: DC | PRN
  Administered 2024-03-18: 100 mg via INTRAVENOUS

## 2024-03-18 MED ORDER — MIDAZOLAM HCL 2 MG/2ML IJ SOLN
INTRAMUSCULAR | Status: AC
  Filled 2024-03-18: qty 2

## 2024-03-18 MED ORDER — GLYCOPYRROLATE 0.2 MG/ML IJ SOLN
INTRAMUSCULAR | Status: DC | PRN
  Administered 2024-03-18: .2 mg via INTRAVENOUS

## 2024-03-18 MED ORDER — GELATIN ABSORBABLE 12-7 MM EX MISC
CUTANEOUS | Status: AC
  Filled 2024-03-18: qty 1

## 2024-03-18 MED ORDER — HYDROCODONE-ACETAMINOPHEN 5-325 MG PO TABS
1.0000 | ORAL_TABLET | Freq: Four times a day (QID) | ORAL | 0 refills | Status: AC | PRN
  Filled 2024-03-18: qty 20, 5d supply, fill #0

## 2024-03-18 MED ORDER — PROPOFOL 10 MG/ML IV BOLUS
INTRAVENOUS | Status: DC | PRN
  Administered 2024-03-18: 200 mg via INTRAVENOUS

## 2024-03-18 MED ORDER — PHENYLEPHRINE 80 MCG/ML (10ML) SYRINGE FOR IV PUSH (FOR BLOOD PRESSURE SUPPORT)
PREFILLED_SYRINGE | INTRAVENOUS | Status: DC | PRN
Start: 2024-03-18 — End: 2024-03-18
  Administered 2024-03-18: 80 ug via INTRAVENOUS
  Administered 2024-03-18 (×2): 160 ug via INTRAVENOUS

## 2024-03-18 MED ORDER — PROPOFOL 1000 MG/100ML IV EMUL
INTRAVENOUS | Status: AC
  Filled 2024-03-18: qty 100

## 2024-03-18 MED ORDER — FENTANYL CITRATE (PF) 100 MCG/2ML IJ SOLN
INTRAMUSCULAR | Status: AC
  Filled 2024-03-18: qty 2

## 2024-03-18 MED ORDER — BUPIVACAINE-EPINEPHRINE 0.5% -1:200000 IJ SOLN
INTRAMUSCULAR | Status: DC | PRN
  Administered 2024-03-18: 50 mL

## 2024-03-18 SURGICAL SUPPLY — 30 items
BRIEF MESH DISP 2XL (UNDERPADS AND DIAPERS) ×1 IMPLANT
DISSECTOR SURG LIGASURE 21 (MISCELLANEOUS) IMPLANT
DRAPE LAPAROTOMY 100X77 ABD (DRAPES) ×1 IMPLANT
DRAPE LEGGINS SURG 28X43 STRL (DRAPES) ×1 IMPLANT
DRAPE UNDER BUTTOCK W/FLU (DRAPES) ×1 IMPLANT
ELECTRODE REM PT RTRN 9FT ADLT (ELECTROSURGICAL) ×1 IMPLANT
GAUZE 4X4 16PLY ~~LOC~~+RFID DBL (SPONGE) IMPLANT
GAUZE SPONGE 4X4 12PLY STRL (GAUZE/BANDAGES/DRESSINGS) ×1 IMPLANT
GLOVE BIO SURGEON STRL SZ 6.5 (GLOVE) ×1 IMPLANT
GLOVE BIOGEL PI IND STRL 6.5 (GLOVE) ×1 IMPLANT
GLOVE SURG SYN 6.5 PF PI (GLOVE) ×2 IMPLANT
GOWN STRL REUS W/ TWL LRG LVL3 (GOWN DISPOSABLE) ×3 IMPLANT
KIT TURNOVER CYSTO (KITS) ×1 IMPLANT
LABEL OR SOLS (LABEL) ×1 IMPLANT
MANIFOLD NEPTUNE II (INSTRUMENTS) ×1 IMPLANT
NDL HYPO 22X1.5 SAFETY MO (MISCELLANEOUS) ×1 IMPLANT
NEEDLE HYPO 22X1.5 SAFETY MO (MISCELLANEOUS) ×1 IMPLANT
NS IRRIG 500ML POUR BTL (IV SOLUTION) ×1 IMPLANT
PACK BASIN MINOR ARMC (MISCELLANEOUS) ×1 IMPLANT
PAD ABD DERMACEA PRESS 5X9 (GAUZE/BANDAGES/DRESSINGS) ×1 IMPLANT
PAD PREP OB/GYN DISP 24X41 (PERSONAL CARE ITEMS) ×1 IMPLANT
SOLUTION PREP PVP 2OZ (MISCELLANEOUS) ×1 IMPLANT
SURGILUBE 2OZ TUBE FLIPTOP (MISCELLANEOUS) ×1 IMPLANT
SUT VIC AB 2-0 SH 27XBRD (SUTURE) ×1 IMPLANT
SUT VIC AB 3-0 SH 27X BRD (SUTURE) ×1 IMPLANT
SUTURE EHLN 3-0 FS-10 30 BLK (SUTURE) IMPLANT
SYR 10ML LL (SYRINGE) ×1 IMPLANT
SYR BULB IRRIG 60ML STRL (SYRINGE) ×1 IMPLANT
TRAP FLUID SMOKE EVACUATOR (MISCELLANEOUS) ×1 IMPLANT
WATER STERILE IRR 500ML POUR (IV SOLUTION) ×1 IMPLANT

## 2024-03-18 NOTE — Transfer of Care (Signed)
 Immediate Anesthesia Transfer of Care Note  Patient: [name] [name].  Procedure(s) Performed: HEMORRHOIDECTOMY (Rectum)  Patient Location: PACU  Anesthesia Type:General  Level of Consciousness: awake, drowsy, and patient cooperative  Airway & Oxygen Therapy: Patient Spontanous Breathing and Patient connected to face mask oxygen  Post-op Assessment: Report given to RN and Post -op Vital signs reviewed and stable  Post vital signs: Reviewed and stable  Last Vitals:  Vitals Value Taken Time  BP 115/69 03/18/24 08:26  Temp    Pulse 78 03/18/24 08:29  Resp 17 03/18/24 08:29  SpO2 99 % 03/18/24 08:29  Vitals shown include unfiled device data.  Last Pain:  Vitals:   03/18/24 0618  PainSc: 2          Complications: No notable events documented.

## 2024-03-18 NOTE — Op Note (Signed)
 Preoperative diagnosis: Recurrent thrombosed hemorrhoids.   Postoperative diagnosis: Same  Procedure: Anoscopy, hemorrhoidectomy.  Surgeon: Dr. Rodolph  Anesthesia: Spinal  Wound classification: Clean Contaminated  Indications: Patient is a 48 y.o. male was found to have symptomatic hemorrhoids refractory to medical managemen.   Findings: 1.  Second degree hemorrhoids 2. Internal and external anal sphincter identified and preserved 3. Adequate hemostasis  Description of procedure: The patient was brought to the operating room and spinal anesthesia was induced. Patient was placed in the prone jackknife position. A time-out was completed verifying correct patient, procedure, site, positioning, and implant(s) and/or special equipment prior to beginning this procedure. The buttocks were taped apart.  The perineum was prepped and draped in standard sterile fashion. Local anesthetic was injected as a perianal block. An anoscope was introduced and the three hemorrhoidal pedicles were identified. A Kelly clamp was placed near the base of each pedicle near the dentate line and retracted externally to exteriorize the hemorrhoidal pedicles.  The left lateral pedicle and the right anterior pedicle where excised in turn in the following fashion. An elliptical incision was made extending from perianal skin to anorectal ring including both internal and external hemorrhoids and excising a minimum amount of anoderm. Flaps were developed on both aspects of the incision, taking care to elevate only skin and mucosa. The dilated venous mass was dissected using Metzenbaum scissors from the underlying sphincter muscle. The base was ligated with a 2-0 Vicryl figure of 8 suture. The pedicle was amputated from the base. Hemostasis was achieved using electrocautery. Following hemostasis, the skin and mucosal incisions were closed with a running lock stitch of 2-0 Vicryl on the mucosal aspect and converted to  subcuticular once skin was encountered. The anal canal was then injected with local anesthetic. A gauze pad was tucked between the gluteal folds.  The patient tolerated the procedure well and was taken to the postanesthesia care unit in stable condition.   Specimen: hemorrhoids  Complications: none  EBL: 10 mL

## 2024-03-18 NOTE — Anesthesia Preprocedure Evaluation (Addendum)
 Anesthesia Evaluation  Patient identified by MRN, date of birth, ID band Patient awake    Reviewed: Allergy & Precautions, H&P , NPO status , Patient's Chart, lab work & pertinent test results, reviewed documented beta blocker date and time   Airway Mallampati: III  TM Distance: >3 FB Neck ROM: full    Dental  (+) Teeth Intact   Pulmonary shortness of breath and with exertion, sleep apnea , pneumonia, resolved, former smoker   Pulmonary exam normal        Cardiovascular Exercise Tolerance: Poor hypertension, On Medications + angina with exertion (-) CHF, (-) Orthopnea, (-) PND, (-) DOE and (-) DVT Atrial Fibrillation  Rhythm:regular Rate:Normal  Negative Cath with [place] 2024, neg calcium score   Neuro/Psych  Headaches PSYCHIATRIC DISORDERS Anxiety Depression       GI/Hepatic Neg liver ROS,GERD  Medicated,,  Endo/Other  diabetes, Well Controlled  Class 3 obesity  Renal/GU negative Renal ROS  negative genitourinary   Musculoskeletal   Abdominal   Peds  Hematology negative hematology ROS (+)   Anesthesia Other Findings Past Medical History: No date: Anginal pain No date: Anxiety No date: Arthritis No date: Depressed No date: Depression No date: Diabetes mellitus without complication (HCC) No date: Dyspnea No date: GERD (gastroesophageal reflux disease) No date: Headache(784.0) No date: Hypertension No date: Pneumonia No date: Sleep apnea     Comment:  cpap Past Surgical History: No date: BACK SURGERY 05/06/2021: COLONOSCOPY; N/A     Comment:  Procedure: COLONOSCOPY;  Surgeon: Janalyn Keene NOVAK,               MD;  Location: [facility] ENDOSCOPY;  Service:               Gastroenterology;  Laterality: N/A; No date: LUMBAR DISC SURGERY No date: LUMBAR DISC SURGERY     Comment:  bulging disc repaired 11/08/2011: LUMBAR SPINE SURGERY 11/23/2011: LUMBAR WOUND DEBRIDEMENT     Comment:  Procedure: LUMBAR WOUND  DEBRIDEMENT;  Surgeon: Catalina CHRISTELLA Stains, MD;  Location: [facility];  Service:               Neurosurgery;  Laterality: N/A;  Incision and drainage of              lumbar wound No date: shoulder arthoscopic; Right No date: spine     Comment:  spine spurs cleaned up and arthritis No date: WISDOM TOOTH EXTRACTION BMI    Body Mass Index: 37.29 kg/m     Reproductive/Obstetrics negative OB ROS                              Anesthesia Physical Anesthesia Plan  ASA: 3  Anesthesia Plan: General ETT   Post-op Pain Management:    Induction:   PONV Risk Score and Plan: 3  Airway Management Planned:   Additional Equipment:   Intra-op Plan:   Post-operative Plan:   Informed Consent: I have reviewed the patients History and Physical, chart, labs and discussed the procedure including the risks, benefits and alternatives for the proposed anesthesia with the patient or authorized representative who has indicated his/her understanding and acceptance.     Dental Advisory Given  Plan Discussed with: CRNA  Anesthesia Plan Comments:          Anesthesia Quick Evaluation

## 2024-03-18 NOTE — Interval H&P Note (Signed)
 History and Physical Interval Note:  03/18/2024 7:12 AM  [name] [name].  has presented today for surgery, with the diagnosis of K64.5 thrombosed external hemorrhoids.  The various methods of treatment have been discussed with the patient and family. After consideration of risks, benefits and other options for treatment, the patient has consented to  Procedure(s): HEMORRHOIDECTOMY (N/A) as a surgical intervention.  The patient's history has been reviewed, patient examined, no change in status, stable for surgery.  I have reviewed the patient's chart and labs.  Questions were answered to the patient's satisfaction.     Lucas Sjogren

## 2024-03-18 NOTE — Anesthesia Postprocedure Evaluation (Signed)
 Anesthesia Post Note  Patient: [name] [name].  Procedure(s) Performed: HEMORRHOIDECTOMY (Rectum)  Patient location during evaluation: PACU Anesthesia Type: General Level of consciousness: awake and alert Pain management: pain level controlled Vital Signs Assessment: post-procedure vital signs reviewed and stable Respiratory status: spontaneous breathing, nonlabored ventilation, respiratory function stable and patient connected to nasal cannula oxygen Cardiovascular status: blood pressure returned to baseline and stable Postop Assessment: no apparent nausea or vomiting Anesthetic complications: no   No notable events documented.   Last Vitals:  Vitals:   03/18/24 0826 03/18/24 0830  BP: 115/69 100/79  Pulse: 75 78  Resp: (!) 23 17  Temp: (!) 36.2 C   SpO2: 100% 99%    Last Pain:  Vitals:   03/18/24 0826  PainSc: Asleep                 Lynwood KANDICE Clause

## 2024-03-18 NOTE — Discharge Instructions (Signed)

## 2024-03-18 NOTE — Anesthesia Procedure Notes (Signed)
 Procedure Name: Intubation Date/Time: 03/18/2024 7:34 AM  Performed by: Ledora Duncan, CRNAPre-anesthesia Checklist: Patient identified, Emergency Drugs available, Suction available and Patient being monitored Patient Re-evaluated:Patient Re-evaluated prior to induction Oxygen Delivery Method: Circle system utilized Preoxygenation: Pre-oxygenation with 100% oxygen Induction Type: IV induction and Cricoid Pressure applied Ventilation: Mask ventilation without difficulty Laryngoscope Size: McGrath and 4 Grade View: Grade I Tube type: Oral Number of attempts: 1 Airway Equipment and Method: Stylet and Oral airway Placement Confirmation: ETT inserted through vocal cords under direct vision, positive ETCO2 and breath sounds checked- equal and bilateral Secured at: 21 cm Tube secured with: Tape Dental Injury: Teeth and Oropharynx as per pre-operative assessment

## 2024-03-19 ENCOUNTER — Encounter: Payer: Self-pay | Admitting: General Surgery

## 2024-03-19 LAB — SURGICAL PATHOLOGY

## 2024-04-12 ENCOUNTER — Other Ambulatory Visit: Payer: Self-pay

## 2024-04-12 MED ORDER — TRULICITY 4.5 MG/0.5ML ~~LOC~~ SOAJ
4.5000 mg | SUBCUTANEOUS | 5 refills | Status: AC
  Filled 2024-04-12: qty 2, 28d supply, fill #0
  Filled 2024-05-20: qty 2, 28d supply, fill #1
  Filled 2024-06-17: qty 2, 28d supply, fill #2
  Filled 2024-07-16: qty 2, 28d supply, fill #3

## 2024-05-20 ENCOUNTER — Other Ambulatory Visit: Payer: Self-pay

## 2024-05-31 ENCOUNTER — Other Ambulatory Visit: Payer: Self-pay

## 2024-05-31 ENCOUNTER — Encounter: Payer: Self-pay | Admitting: Adult Health

## 2024-05-31 ENCOUNTER — Ambulatory Visit: Payer: Self-pay | Admitting: Adult Health

## 2024-05-31 VITALS — BP 124/88 | HR 91 | Ht 72.0 in | Wt 260.0 lb

## 2024-05-31 DIAGNOSIS — R42 Dizziness and giddiness: Secondary | ICD-10-CM

## 2024-05-31 MED ORDER — MECLIZINE HCL 12.5 MG PO TABS: 12.5000 mg | ORAL_TABLET | Freq: Three times a day (TID) | ORAL | 0 refills | Status: AC | PRN

## 2024-05-31 NOTE — Progress Notes (Signed)
 Therapist, [facility] [address] [place], [place] [address]   Office Visit Note  Patient Name: [name] [name]. Date of Birth [birth year]  Medical Record number [id number]  Date of Service: 05/31/2024  Chief Complaint  Patient presents with   Dizziness    Dizziness with movement; began 4 days ago     HPI Pt is here for a sick visit. He reports about 4 days ago he started having some dizziness with movement.  He reports some inner ear issues some years ago, but that has not been a problem recently.  He states that any time his body position changes,(bending, flexing, turnings, laying down) he will get a 10 second episode of dizziness. He has been having some head congestion/sinus pressure as well.    Current Medication:  Outpatient Encounter Medications as of 05/31/2024  Medication Sig   aspirin  81 MG chewable tablet Chew 81 mg by mouth daily.   cetirizine (ZYRTEC) 10 MG tablet Take 10 mg by mouth daily.   cholecalciferol (VITAMIN D3) 25 MCG (1000 UNIT) tablet Take 1,000 Units by mouth daily.   CINNAMON PO Take 1 capsule by mouth daily.   diazepam  (VALIUM ) 5 MG tablet Take 1 tablet (5 mg total) by mouth every 12 (twelve) hours as needed for anxiety.   Dulaglutide  (TRULICITY ) 4.5 MG/0.5ML SOAJ Inject 4.5 mg into the skin once a week.   EPINEPHrine  0.3 mg/0.3 mL IJ SOAJ injection Inject 0.3 mg into the muscle as needed.   meclizine  (ANTIVERT ) 12.5 MG tablet Take 1 tablet (12.5 mg total) by mouth 3 (three) times daily as needed for dizziness.   metFORMIN  (GLUCOPHAGE ) 1000 MG tablet TAKE 1 TABLET BY MOUTH 2 (TWO) TIMES DAILY WITH A MEAL. TAKE DAILY WITH BREAKFAST AND AT DINNER   Multiple Vitamin (MULTIVITAMIN PO) Take by mouth daily.   omeprazole (PRILOSEC) 20 MG capsule Take 20 mg by mouth daily.   rosuvastatin (CRESTOR) 20 MG tablet Take 20 mg by mouth daily.   sertraline  (ZOLOFT ) 100 MG tablet TAKE 1 TABLET BY MOUTH EVERY DAY   tadalafil (CIALIS) 10 MG tablet Take 10 mg by  mouth daily as needed.   Dulaglutide  (TRULICITY ) 1.5 MG/0.5ML SOPN Inject 1.5 mg into the skin once a week. (Patient not taking: Reported on 03/18/2024)   Dulaglutide  (TRULICITY ) 3 MG/0.5ML SOAJ Inject 0.5 mLs (3 mg total) subcutaneously every 7 (seven) days (Patient not taking: Reported on 10/24/2023)   [DISCONTINUED] methylPREDNISolone  (MEDROL  DOSEPAK) 4 MG TBPK tablet Take as directed. (Patient not taking: Reported on 03/18/2024)   No facility-administered encounter medications on file as of 05/31/2024.      Medical History: Past Medical History:  Diagnosis Date   Anginal pain    Anxiety    Arthritis    Depressed    Depression    Diabetes mellitus without complication (HCC)    Dyspnea    GERD (gastroesophageal reflux disease)    Headache(784.0)    Hypertension    Pneumonia    Sleep apnea    cpap     Vital Signs: BP 124/88   Pulse 91   Ht 6' (1.829 m)   Wt 260 lb (117.9 kg)   SpO2 98%   BMI 35.26 kg/m    Review of Systems  Constitutional:  Negative for chills, fatigue and fever.  Neurological:  Positive for dizziness.    Physical Exam Vitals reviewed.  Constitutional:      Appearance: Normal appearance.  HENT:     Head: Normocephalic.  Right Ear: Tympanic membrane, ear canal and external ear normal.     Left Ear: Tympanic membrane, ear canal and external ear normal.  Neurological:     Mental Status: He is alert.     Assessment/Plan: 1. Dizziness (Primary) Try sudafed for a few days, if that does not help, take meclizine  as prescribed and follow up with us  next week.  - meclizine  (ANTIVERT ) 12.5 MG tablet; Take 1 tablet (12.5 mg total) by mouth 3 (three) times daily as needed for dizziness.  Dispense: 30 tablet; Refill: 0     General Counseling: [place] verbalizes understanding of the findings of todays visit and agrees with plan of treatment. I have discussed any further diagnostic evaluation that may be needed or ordered today. We also reviewed his  medications today. he has been encouraged to call the office with any questions or concerns that should arise related to todays visit.   No orders of the defined types were placed in this encounter.   Meds ordered this encounter  Medications   meclizine  (ANTIVERT ) 12.5 MG tablet    Sig: Take 1 tablet (12.5 mg total) by mouth 3 (three) times daily as needed for dizziness.    Dispense:  30 tablet    Refill:  0    Time spent:15 Minutes    Juliene DOROTHA Howells AGNP-C Nurse Practitioner

## 2024-06-11 ENCOUNTER — Other Ambulatory Visit (HOSPITAL_COMMUNITY): Payer: Self-pay

## 2024-06-17 ENCOUNTER — Other Ambulatory Visit: Payer: Self-pay
